# Patient Record
Sex: Male | Born: 1950 | Race: Black or African American | Hispanic: No | Marital: Single | Smoking: Former smoker
Health system: Southern US, Community
[De-identification: ages and names within clinical notes are randomized; demographics above are authoritative.]

## PROBLEM LIST (undated history)

## (undated) DIAGNOSIS — R011 Cardiac murmur, unspecified: Secondary | ICD-10-CM

## (undated) DIAGNOSIS — G9389 Other specified disorders of brain: Secondary | ICD-10-CM

## (undated) DIAGNOSIS — I739 Peripheral vascular disease, unspecified: Secondary | ICD-10-CM

## (undated) DIAGNOSIS — Q210 Ventricular septal defect: Secondary | ICD-10-CM

## (undated) DIAGNOSIS — I5022 Chronic systolic (congestive) heart failure: Secondary | ICD-10-CM

## (undated) DIAGNOSIS — I10 Essential (primary) hypertension: Secondary | ICD-10-CM

## (undated) DIAGNOSIS — R569 Unspecified convulsions: Secondary | ICD-10-CM

## (undated) DIAGNOSIS — E875 Hyperkalemia: Secondary | ICD-10-CM

## (undated) DIAGNOSIS — I2510 Atherosclerotic heart disease of native coronary artery without angina pectoris: Secondary | ICD-10-CM

## (undated) DIAGNOSIS — N289 Disorder of kidney and ureter, unspecified: Secondary | ICD-10-CM

## (undated) DIAGNOSIS — I209 Angina pectoris, unspecified: Secondary | ICD-10-CM

## (undated) DIAGNOSIS — K219 Gastro-esophageal reflux disease without esophagitis: Secondary | ICD-10-CM

## (undated) HISTORY — PX: OTHER SURGICAL HISTORY: SHX169

## (undated) HISTORY — PX: NECK SURGERY: SHX720

---

## 2012-07-04 ENCOUNTER — Emergency Department (HOSPITAL_COMMUNITY)
Admission: EM | Admit: 2012-07-04 | Discharge: 2012-07-04 | Disposition: A | Discharge status: Home / Self Care | Payer: Medicare Other | Attending: Emergency Medicine | Admitting: Emergency Medicine

## 2012-07-04 ENCOUNTER — Encounter (HOSPITAL_COMMUNITY): Payer: Self-pay

## 2012-07-04 ENCOUNTER — Other Ambulatory Visit: Payer: Self-pay | Admitting: *Deleted

## 2012-07-04 DIAGNOSIS — R21 Rash and other nonspecific skin eruption: Secondary | ICD-10-CM | POA: Insufficient documentation

## 2012-07-04 DIAGNOSIS — I739 Peripheral vascular disease, unspecified: Secondary | ICD-10-CM | POA: Insufficient documentation

## 2012-07-04 DIAGNOSIS — Z87891 Personal history of nicotine dependence: Secondary | ICD-10-CM | POA: Insufficient documentation

## 2012-07-04 DIAGNOSIS — Z9889 Other specified postprocedural states: Secondary | ICD-10-CM | POA: Insufficient documentation

## 2012-07-04 DIAGNOSIS — M549 Dorsalgia, unspecified: Secondary | ICD-10-CM | POA: Insufficient documentation

## 2012-07-04 DIAGNOSIS — R209 Unspecified disturbances of skin sensation: Secondary | ICD-10-CM | POA: Insufficient documentation

## 2012-07-04 DIAGNOSIS — M79609 Pain in unspecified limb: Secondary | ICD-10-CM

## 2012-07-04 DIAGNOSIS — Z79899 Other long term (current) drug therapy: Secondary | ICD-10-CM | POA: Insufficient documentation

## 2012-07-04 DIAGNOSIS — I2510 Atherosclerotic heart disease of native coronary artery without angina pectoris: Secondary | ICD-10-CM | POA: Insufficient documentation

## 2012-07-04 DIAGNOSIS — I10 Essential (primary) hypertension: Secondary | ICD-10-CM | POA: Insufficient documentation

## 2012-07-04 HISTORY — DX: Atherosclerotic heart disease of native coronary artery without angina pectoris: I25.10

## 2012-07-04 HISTORY — DX: Essential (primary) hypertension: I10

## 2012-07-04 LAB — URINALYSIS, ROUTINE W REFLEX MICROSCOPIC
Glucose, UA: NEGATIVE mg/dL
Hgb urine dipstick: NEGATIVE
Leukocytes, UA: NEGATIVE
Specific Gravity, Urine: 1.017 (ref 1.005–1.030)
Urobilinogen, UA: 1 mg/dL (ref 0.0–1.0)

## 2012-07-04 NOTE — Progress Notes (Addendum)
*  PRELIMINARY RESULTS* Vascular Ultrasound Right lower extremity venous duplex completed. Right lower extremity is negative for deep vein thrombosis. No evidence of right Baker's cyst.  Right Lower Extremity Arterial Duplex has been completed.   There is evidence of diminished waveforms in the distal right lower extremity with absence of flow in the right posterior tibial artery.   VASCULAR LAB PRELIMINARY  ARTERIAL  ABI completed:    RIGHT    LEFT    PRESSURE WAVEFORM  PRESSURE WAVEFORM  BRACHIAL 175 Triphasic BRACHIAL 183 Triphasic  DP 93 Monophasic DP 103 Triphasic  AT   AT    PT  Unable to insonate PT 143 Monophasic  PER   PER    GREAT TOE  NA GREAT TOE  NA    RIGHT LEFT  ABI 0.51 0.78   The right ABI is suggestive of moderate, borderline severe, arterial insufficiency. Unable to insonate the right posterior tibial artery. The left ABI is suggestive of mild arterial insufficiency.  Preliminary results discussed with Dr.Rancour.  07/04/2012 3:13 PM Gertie Fey, RDMS, RDCS

## 2012-07-04 NOTE — ED Notes (Signed)
Rt. Leg , lt. 3rd finger and pain for months.  Also having lower back pain and rt. Lower flank pain . Having urgency when voiding,  Denies any dsyuria   Pt. Denies any n/v.

## 2012-07-04 NOTE — ED Provider Notes (Signed)
I saw and evaluated the patient, reviewed the resident's note and I agree with the findings and plan.  RLE claudication with recent SFA stent placed at [facility] 2 weeks ago. No rest pain. Unable to palpate or doppler PT or DP pulses. Femorals intact. 5/5 strength in bilateral lower extremities. Ankle plantar and dorsiflexion intact. Great toe extension intact bilaterally.  +2 patellar reflexes bilaterally. antalgic gait. D/w Dr. Hart Rochester who has seen patient. 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 Plan to perform angiogram tomorrow.   Glynn Octave, MD 07/04/12 1736

## 2012-07-04 NOTE — ED Notes (Signed)
Pt c/o right calf and leg pain along with right sided flank pain and lower back pain. Pt reports it has been ongoing X 2 weeks. Pt sts his right foot feels like pins and needles. Pt sts he got dizzy when he was on the way here because he was walking fast. Pt in nad, skin warm and dry, resp e/u.

## 2012-07-04 NOTE — ED Provider Notes (Signed)
History     CSN: [id number]  Arrival date & time 07/04/12  1112   Chief Complaint  Patient presents with  . Leg Pain   HPI 62 y/o F very poor historian with PMHx significant for CAD and HTN presents to ED with right leg pain. He reports was discharged from [facility] 2 weeks ago were he was worked up for the same symptoms. Pt was told to have a blockage and 2 stents were inserted on his right leg. The pain is located on right calf with radiation to all his right foot. It is described as constant aching with intensity of 7-8/10. Reports pain worsens with exertion (walking) and alleviates mildly with a medication he does not remember name but makes his feel high/sleepy.   The associated symptoms are numbness and cold sensation of right foot. Pt also reports mild back pain but denies weakness or tingling sensation. No urinary or fecal incontinence, no saddle anesthesia.   Records from [facility] are received and they showed pt was admitted with malignant HTN and gastroenteritis developing NSTEMI while hospitalized. 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 Hypertension   . Coronary artery disease     Past Surgical History  Procedure Laterality Date  . Neck surgery      No family history on file.  History  Substance Use Topics  . Smoking status: Former Games developer  . Smokeless tobacco: Not on file  . Alcohol Use: No    Review of Systems  Constitutional: Negative.   HENT: Negative.   Respiratory: Negative.   Cardiovascular: Negative for chest pain, palpitations and leg swelling.  Gastrointestinal: Negative.   Genitourinary: Negative.   Musculoskeletal: Positive for back pain.  Skin:       Mild pruriginous papular rash on left arm.   Neurological: Positive for numbness.  Negative for dizziness and weakness.  Psychiatric/Behavioral: Negative.     Allergies  Review of patient's allergies indicates no known allergies.  Home Medications  No current outpatient prescriptions on file.  BP 146/65  Pulse 74  Temp(Src) 0 F (-17.8 C)  Resp 18  SpO2 97%  Physical Exam  Constitutional: He is oriented to person, place, and time. No distress.  Neck: Normal range of motion. Neck supple.  Old scar on right side of neck.  Cardiovascular: Normal rate, regular rhythm and normal heart sounds.  Exam reveals no gallop.   No murmur heard. Pulmonary/Chest: Effort normal and breath sounds normal.  Abdominal: Soft. Bowel sounds are normal.  Genitourinary:  Left groin with band aid still placed on site of catheterization for stent placement.  Musculoskeletal: He exhibits tenderness. He exhibits no edema.  Tenderness on right calf. No discrepancy on measurements of both LE. No edema or erythema noticed. Difficult to palpate pedal and poplitial pulses bilaterally. Good femoral pulses. Normal capillary refill and temperature on both extremities.   Neurological: He is alert and oriented to person, place, and time. He has normal strength and normal reflexes. No cranial nerve deficit or sensory deficit.  Antalgic gait.   Skin:  4 pruriginous papular lesions of 0.5 on right antecubital area.   Psychiatric: He has a normal mood and affect. He is not agitated.    ED Course  Procedures (including critical care time)  Labs Reviewed  URINALYSIS, ROUTINE  W REFLEX MICROSCOPIC   No results found.  RIGHT  LEFT   ABI  0.51  0.78   The right ABI is suggestive of moderate, borderline severe, arterial insufficiency. Unable to insonate the right posterior tibial artery. The left ABI is suggestive of mild arterial insufficiency  1. Right leg pain   2. Claudication    MDM  Regular dopplers were unsuccessful in finding bilateral pedal pulses. Lower Extremity Venous and Arterial  Doppler showed right borderline severe arterial insufficiency. Vascular Surgery consulted and pt will be discharged today with follow up tomorrow 07/05/2012, 8:00 am at [facility] Short Stay for further evaluation/intervention.   Dayarmys Piloto de Criselda Peaches, MD 07/04/12 1525

## 2012-07-04 NOTE — Consult Note (Signed)
[facility]ASCULAR [facility]IN SPECIALISTS OF  CONSULT NOTE 07/04/2012 DOB: [id number] MRN : [id number]  WN:[id number] calf pain with activity.  Pain is better with rest Referring Physician:Dayarmys Piloto de Criselda Peaches, MD ED [facility]  History of Present Illness: This Paul Mullins was bring a friend to [facility] today and had sudden onset of right calf pain walking from the parking lot.  He has has right calf pain with walking 25-50 feet that subsides with rest for over a year.  He had right SFA stent placement at  [facility]  2 weeks ago.  He thinks his pain is getting worse and not better. He denies any rest pain or numbness in his right foot. He states the claudication symptoms have not improved since her recent intervention performed in [place] Past Medical History  Diagnosis Date  . Hypertension   . Coronary artery disease     Past Surgical History  Procedure Laterality Date  . Neck surgery       ROS: [x]  Positive  [ ]  Denies    General: [ ]  Weight loss, [ ]  Fever, [ ]  chills Neurologic: [ ]  Dizziness, [ ]  Blackouts, [ ]  Seizure [ ]  Stroke, [ ]  "Mini stroke", [ ]  Slurred speech, [ ]  Temporary blindness; [ ]  weakness in arms or legs, [ ]  Hoarseness Cardiac: [ ]  Chest pain/pressure, [ ]  Shortness of breath at rest [x ] Shortness of breath with exertion, [ ]  Atrial fibrillation or irregular heartbeat Vascular: x[ ]  Pain in legs with walking, [ ]  Pain in legs at rest, [ ]  Pain in legs at night,  [ ]  Non-healing ulcer, [ ]  Blood clot in vein/DVT,   Pulmonary: [ ]  Home oxygen, [ ]  Productive cough, [ ]  Coughing up blood, [ ]  Asthma,  [ ]  Wheezing Musculoskeletal:  [ ]  Arthritis, [ ]  Low back pain, [ ]  Joint pain Hematologic: [ ]  Easy Bruising, [ ]  Anemia; [ ]  Hepatitis Gastrointestinal: [ ]  Blood in stool, [ ]  Gastroesophageal Reflux/heartburn, [ ]  Trouble swallowing Urinary: [ ]  chronic Kidney disease, [ ]  on HD - [ ]  MWF or [ ]  TTHS, [ ]  Burning with urination, [ ]   Difficulty urinating Skin: [x ] Rashes both upper arms, [ ]  Wounds Psychological: [ ]  Anxiety, [ ]  Depression  Social History History  Substance Use Topics  . Smoking status: Former Games developer  . Smokeless tobacco: Not on file  . Alcohol Use: No    Family History Brother DM Father DM, HNT Mother DM   No Known Allergies  No current facility-administered medications for this encounter.   Current Outpatient Prescriptions  Medication Sig Dispense Refill  . amLODipine (NORVASC) 10 MG tablet Take 10 mg by mouth daily.      . carvedilol (COREG) 25 MG tablet Take 25 mg by mouth 2 (two) times daily with a meal.      . hydrALAZINE (APRESOLINE) 100 MG tablet Take 100 mg by mouth 3 (three) times daily.      . isosorbide mononitrate (IMDUR) 60 MG 24 hr tablet Take 60 mg by mouth daily.      . nitroGLYCERIN (NITROSTAT) 0.4 MG SL tablet Place 0.4 mg under the tongue every 5 (five) minutes as needed for chest pain.      . pravastatin (PRAVACHOL) 40 MG tablet Take 40 mg by mouth daily.         Imaging: No results found.  Significant Diagnostic Studies: CBC No results found for this  basename: WBC, HGB, HCT, MCV, PLT    BMET No results found for this basename: na, k, cl, co2, glucose, bun, creatinine, calcium, gfrnonaa, gfraa    COAG No results found for this basename: INR, PROTIME   No results found for this basename: PTT     Physical Examination BP Readings from Last 3 Encounters:  07/04/12 164/67   Temp Readings from Last 3 Encounters:   SpO2 Readings from Last 3 Encounters:  07/04/12 99%   Pulse Readings from Last 3 Encounters:  07/04/12 67    General:  WDWN in NAD HENT: WNL Eyes: Pupils equal Pulmonary: normal non-labored breathing Cardiac: RRR, without  Murmurs, rubs or gallops; No carotid bruits Abdomen: soft, NT Skin:  Rashes both upper arms, ulcers noted Vascular Exam/Pulses:Radila pulses palpable.  Femoral pulses palpable, non palpable popliteal pulses no  palpable DP/PT bilateral. Doppler PT/DP left, DP right Extremities without ischemic changes, no Gangrene , no cellulitis; no open wounds;  Musculoskeletal: no muscle wasting or atrophy  Neurologic: A&O X 3; Appropriate Affect ;  SENSATION: normal; MOTOR FUNCTION: Pt has good and equal strength in all extremities - 5/5 Speech is fluent/normal  Non-Invasive Vascular Imaging: ABI  Right 0.51  Left 0.78   ASSESSMENT/PLAN: Return tomorrow for angiogram per DR. Chen to see if he is a candidate for further intervention in the right Lower extremity.  Paul Mullins, Paul Mullins  Agree with above assessment Patient has chronic claudication right leg and had SFA stenosis treated with a stent in [place] on 05/25/2012 Patient was to have returned for further consideration of an attempt to open up a chronic occlusion of the popliteal artery which was unsuccessful on previous attempt. Patient states he would like to transfer his care to [facility]  Patient has audible flow in right AT  and an ABI of 0.51 consistent with right popliteal occlusion   Will get angiogram tomorrow per Dr. Imogene Burn to see if patient is candidate for bypass and right leg or another attempt to open right popliteal artery which is chronically occluded. Patient to return in a.m.

## 2012-07-05 ENCOUNTER — Telehealth: Payer: Self-pay | Admitting: Vascular Surgery

## 2012-07-05 ENCOUNTER — Ambulatory Visit (HOSPITAL_COMMUNITY)
Admission: RE | Admit: 2012-07-05 | Discharge: 2012-07-05 | Disposition: A | Discharge status: Home / Self Care | Payer: Medicare Other | Source: Ambulatory Visit | Attending: Vascular Surgery | Admitting: Vascular Surgery

## 2012-07-05 ENCOUNTER — Encounter (HOSPITAL_COMMUNITY)
Admission: RE | Disposition: A | Discharge status: Home / Self Care | Payer: Self-pay | Source: Ambulatory Visit | Attending: Vascular Surgery

## 2012-07-05 ENCOUNTER — Other Ambulatory Visit: Payer: Self-pay | Admitting: *Deleted

## 2012-07-05 ENCOUNTER — Other Ambulatory Visit: Payer: Self-pay

## 2012-07-05 DIAGNOSIS — I708 Atherosclerosis of other arteries: Secondary | ICD-10-CM | POA: Insufficient documentation

## 2012-07-05 DIAGNOSIS — I70219 Atherosclerosis of native arteries of extremities with intermittent claudication, unspecified extremity: Secondary | ICD-10-CM

## 2012-07-05 DIAGNOSIS — I10 Essential (primary) hypertension: Secondary | ICD-10-CM | POA: Insufficient documentation

## 2012-07-05 DIAGNOSIS — I2510 Atherosclerotic heart disease of native coronary artery without angina pectoris: Secondary | ICD-10-CM | POA: Insufficient documentation

## 2012-07-05 HISTORY — PX: ABDOMINAL AORTAGRAM: SHX5454

## 2012-07-05 LAB — POCT I-STAT, CHEM 8
BUN: 17 mg/dL (ref 6–23)
Calcium, Ion: 1.14 mmol/L (ref 1.13–1.30)
Chloride: 106 mEq/L (ref 96–112)
Creatinine, Ser: 1.4 mg/dL — ABNORMAL HIGH (ref 0.50–1.35)
Glucose, Bld: 106 mg/dL — ABNORMAL HIGH (ref 70–99)
HCT: 42 % (ref 39.0–52.0)
Hemoglobin: 14.3 g/dL (ref 13.0–17.0)
Potassium: 3.7 mEq/L (ref 3.5–5.1)
Sodium: 140 mEq/L (ref 135–145)
TCO2: 26 mmol/L (ref 0–100)

## 2012-07-05 SURGERY — ABDOMINAL AORTAGRAM
Anesthesia: LOCAL

## 2012-07-05 MED ORDER — SODIUM CHLORIDE 0.9 % IV SOLN
INTRAVENOUS | Status: DC
  Administered 2012-07-05: 09:00:00 via INTRAVENOUS

## 2012-07-05 MED ORDER — OXYCODONE-ACETAMINOPHEN 5-325 MG PO TABS: 1.0000 | ORAL_TABLET | ORAL | Status: DC | PRN

## 2012-07-05 MED ORDER — SODIUM CHLORIDE 0.9 % IV SOLN: 1.0000 mL/kg/h | INTRAVENOUS | Status: DC

## 2012-07-05 MED ORDER — ACETAMINOPHEN 325 MG PO TABS: 650.0000 mg | ORAL_TABLET | ORAL | Status: DC | PRN

## 2012-07-05 MED ORDER — LIDOCAINE HCL (PF) 1 % IJ SOLN
INTRAMUSCULAR | Status: AC
  Filled 2012-07-05: qty 30

## 2012-07-05 MED ORDER — HYDRALAZINE HCL 20 MG/ML IJ SOLN
INTRAMUSCULAR | Status: AC
  Filled 2012-07-05: qty 1

## 2012-07-05 MED ORDER — HEPARIN (PORCINE) IN NACL 2-0.9 UNIT/ML-% IJ SOLN
INTRAMUSCULAR | Status: AC
  Filled 2012-07-05: qty 500

## 2012-07-05 MED ORDER — HYDRALAZINE HCL 20 MG/ML IJ SOLN
10.0000 mg | INTRAMUSCULAR | Status: DC | PRN
  Administered 2012-07-05 (×2): 10 mg via INTRAVENOUS
  Filled 2012-07-05: qty 0.5

## 2012-07-05 MED ORDER — MORPHINE SULFATE 2 MG/ML IJ SOLN: 2.0000 mg | INTRAMUSCULAR | Status: DC | PRN

## 2012-07-05 MED ORDER — ONDANSETRON HCL 4 MG/2ML IJ SOLN: 4.0000 mg | Freq: Four times a day (QID) | INTRAMUSCULAR | Status: DC | PRN

## 2012-07-05 NOTE — Op Note (Signed)
OPERATIVE NOTE   PROCEDURE: 1.  Left common femoral artery cannulation under ultrasound guidance 2.  Aortogram 3.  Second order arterial selection 4.  Right leg runoff  PRE-OPERATIVE DIAGNOSIS: Short distance right leg claudication  POST-OPERATIVE DIAGNOSIS: same as above   SURGEON: Leonides Sake, MD  ANESTHESIA: conscious sedation  ESTIMATED BLOOD LOSS: 50 cc  CONTRAST: 62 cc  FINDING(S):  Aorta: patent  Superior mesenteric artery: patent Celiac artery: patent  Right Left  RA Patent Patent  CIA Patent Patent  EIA Patent with 50% stenosis just distal to internal takeoff Patent  IIA Patent but 90% stenosis at takeoff Patent but 50% stenosis at takeoff  CFA Patent Patent  SFA Patent but diseased, 50% proximal stenosis and 50-75% distally,occludes distally    PFA Patent but diseased with 50% stenosis and occluded branch   Pop Occluded   Trif Occluded   AT Reconstitutes from collaterals, only vessel contiguous with right foot   Pero Occluded   PT Occluded    SPECIMEN(S):  none  INDICATIONS:   Paul Mullins is a 62 y.o. male who presents with short distance right leg intermittent claudication.  The patient presents for: aortogram and right leg runoff.  I discussed with the patient the nature of angiographic procedures, especially the limited patencies of any endovascular intervention.  The patient is aware of that the risks of an angiographic procedure include but are not limited to: bleeding, infection, access site complications, renal failure, embolization, rupture of vessel, dissection, possible need for emergent surgical intervention, possible need for surgical procedures to treat the patient's pathology, and stroke and death.  The patient is aware of the risks and agrees to proceed.  DESCRIPTION: After full informed consent was obtained from the patient, the patient was brought back to the angiography suite.  The patient was placed supine upon the angiography table and  connected to monitoring equipment.  The patient was then given conscious sedation, the amounts of which are documented in the patient's chart.  The patient was prepped and drape in the standard fashion for an angiographic procedure.  At this point, attention was turned to the left groin.  Under ultrasound guidance, the left common femoral artery will be cannulated with a 18 gauge needle.  The [facility] wire was passed up into the aorta.  The needle was exchanged for a 5-Fr sheath, which was advanced over the wire into the common femoral artery.  The dilator was then removed.  The Omniflush catheter was then loaded over the wire up to the level of L1.  The catheter was connected to the power injector circuit.  After de-airring and de-clotting the circuit, a power injector aortogram was completed.  The [facility] wire was replaced in the catheter, and using the [facility] and Crossover catheter, the right common iliac artery was selected.  The wire was advanced into the external iliac artery.  The catheter would not advance so it was exchange for an end-hole catheter.  Over the wire, I advanced the catheter with some difficulty down into the right external iliac artery.  An automated right leg runoff was completed.  Based on the images, no endovascular intervention is likely to have long term patency.  The patient likely needs a femoral to anterior tibial bypass with profundaplasty.  At this point, I removed the catheter.  The sheath was aspirated.  No clots were present and the sheath was reloaded with heparinized saline.     COMPLICATIONS: none  CONDITION: stable   Leonides Sake,  MD [facility] [place] Office: [phone] Pager: [phone]  07/05/2012, 11:01 AM

## 2012-07-05 NOTE — Progress Notes (Signed)
Dr Imogene Burn called back orders to give 10mg  of IV hydralizine were given.  Pts blood pressure has been reassessed twice since administering the medicine and his BP is still elevated.  Dr Imogene Burn stated to Discharge pt as normal and instruct him to take his home BP meds.  Will continue to monitor closely

## 2012-07-05 NOTE — H&P (View-Only) (Signed)
VASCULAR [facility] VEIN SPECIALISTS OF [place] CONSULT NOTE 07/04/2012 DOB: 07/24/1950 MRN : [id number]  CC:Right calf pain with activity.  Pain is better with rest Referring Physician:Dayarmys Piloto de La Paz, MD ED [facility]  History of Present Illness: This Gentleman was bring a friend to [facility] today and had sudden onset of right calf pain walking from the parking lot.  He has has right calf pain with walking 25-50 feet that subsides with rest for over a year.  He had right SFA stent placement at  [facility]  2 weeks ago.  He thinks his pain is getting worse and not better. He denies any rest pain or numbness in his right foot. He states the claudication symptoms have not improved since her recent intervention performed in [place] Past Medical History  Diagnosis Date  . Hypertension   . Coronary artery disease     Past Surgical History  Procedure Laterality Date  . Neck surgery       ROS: [x] Positive  [ ] Denies    General: [ ] Weight loss, [ ] Fever, [ ] chills Neurologic: [ ] Dizziness, [ ] Blackouts, [ ] Seizure [ ] Stroke, [ ] "Mini stroke", [ ] Slurred speech, [ ] Temporary blindness; [ ] weakness in arms or legs, [ ] Hoarseness Cardiac: [ ] Chest pain/pressure, [ ] Shortness of breath at rest [x ] Shortness of breath with exertion, [ ] Atrial fibrillation or irregular heartbeat Vascular: x[ ] Pain in legs with walking, [ ] Pain in legs at rest, [ ] Pain in legs at night,  [ ] Non-healing ulcer, [ ] Blood clot in vein/DVT,   Pulmonary: [ ] Home oxygen, [ ] Productive cough, [ ] Coughing up blood, [ ] Asthma,  [ ] Wheezing Musculoskeletal:  [ ] Arthritis, [ ] Low back pain, [ ] Joint pain Hematologic: [ ] Easy Bruising, [ ] Anemia; [ ] Hepatitis Gastrointestinal: [ ] Blood in stool, [ ] Gastroesophageal Reflux/heartburn, [ ] Trouble swallowing Urinary: [ ] chronic Kidney disease, [ ] on HD - [ ] MWF or [ ] TTHS, [ ] Burning with urination, [ ]  Difficulty urinating Skin: [x ] Rashes both upper arms, [ ] Wounds Psychological: [ ] Anxiety, [ ] Depression  Social History History  Substance Use Topics  . Smoking status: Former Smoker  . Smokeless tobacco: Not on file  . Alcohol Use: No    Family History Brother DM Father DM, HNT Mother DM   No Known Allergies  No current facility-administered medications for this encounter.   Current Outpatient Prescriptions  Medication Sig Dispense Refill  . amLODipine (NORVASC) 10 MG tablet Take 10 mg by mouth daily.      . carvedilol (COREG) 25 MG tablet Take 25 mg by mouth 2 (two) times daily with a meal.      . hydrALAZINE (APRESOLINE) 100 MG tablet Take 100 mg by mouth 3 (three) times daily.      . isosorbide mononitrate (IMDUR) 60 MG 24 hr tablet Take 60 mg by mouth daily.      . nitroGLYCERIN (NITROSTAT) 0.4 MG SL tablet Place 0.4 mg under the tongue every 5 (five) minutes as needed for chest pain.      . pravastatin (PRAVACHOL) 40 MG tablet Take 40 mg by mouth daily.         Imaging: No results found.  Significant Diagnostic Studies: CBC No results found for this   basename: WBC, HGB, HCT, MCV, PLT    BMET No results found for this basename: na, k, cl, co2, glucose, bun, creatinine, calcium, gfrnonaa, gfraa    COAG No results found for this basename: INR, PROTIME   No results found for this basename: PTT     Physical Examination BP Readings from Last 3 Encounters:  07/04/12 164/67   Temp Readings from Last 3 Encounters:   SpO2 Readings from Last 3 Encounters:  07/04/12 99%   Pulse Readings from Last 3 Encounters:  07/04/12 67    General:  WDWN in NAD HENT: WNL Eyes: Pupils equal Pulmonary: normal non-labored breathing Cardiac: RRR, without  Murmurs, rubs or gallops; No carotid bruits Abdomen: soft, NT Skin:  Rashes both upper arms, ulcers noted Vascular Exam/Pulses:Radila pulses palpable.  Femoral pulses palpable, non palpable popliteal pulses no  palpable DP/PT bilateral. Doppler PT/DP left, DP right Extremities without ischemic changes, no Gangrene , no cellulitis; no open wounds;  Musculoskeletal: no muscle wasting or atrophy  Neurologic: A&O X 3; Appropriate Affect ;  SENSATION: normal; MOTOR FUNCTION: Pt has good and equal strength in all extremities - 5/5 Speech is fluent/normal  Non-Invasive Vascular Imaging: ABI  Right 0.51  Left 0.78   ASSESSMENT/PLAN: Return tomorrow for angiogram per DR. Chen to see if he is a candidate for further intervention in the right Lower extremity.  COLLINS, EMMA MAUREEN  Agree with above assessment Patient has chronic claudication right leg and had SFA stenosis treated with a stent in [place] on 05/25/2012 Patient was to have returned for further consideration of an attempt to open up a chronic occlusion of the popliteal artery which was unsuccessful on previous attempt. Patient states he would like to transfer his care to [place]  Patient has audible flow in right AT  and an ABI of 0.51 consistent with right popliteal occlusion   Will get angiogram tomorrow per Dr. Chen to see if patient is candidate for bypass and right leg or another attempt to open right popliteal artery which is chronically occluded. Patient to return in a.m.  

## 2012-07-05 NOTE — Telephone Encounter (Addendum)
Message copied by Fredrich Birks on Thu Jul 05, 2012 11:51 AM ------      Message from: Melene Plan      Created: Thu Jul 05, 2012 11:20 AM                   ----- Message -----         From: Fransisco Hertz, MD         Sent: 07/05/2012  11:10 AM           To: Reuel Derby, Melene Plan, RN            Firmin Belisle      [phone]      [birth year]            PROCEDURE:      1.  Left common femoral artery cannulation under ultrasound guidance      2.  Aortogram      3.  Second order arterial selection      4.  Right leg runoff            Follow-up: Dr. Hart Rochester 2-4 weeks            Orders(s) for follow-up: BLE vein mapping       ------  07/05/12- mailed letter, unable to reach patient, dpm

## 2012-07-05 NOTE — Interval H&P Note (Signed)
[facility] [place]  History and Physical Update  The patient was interviewed and re-examined.  The patient's previous History and Physical has been reviewed and is unchanged from Dr. Hart Rochester consult on: 07/04/12.  There is no change in the plan of care: Aortogram, right leg runoff, and possible intervention.  Leonides Sake, MD [facility] [place] Office: [phone] Pager: [phone]  07/05/2012, 7:34 AM

## 2012-07-05 NOTE — Progress Notes (Signed)
pts BP is slowly increasing.  I have paged Dr Imogene Burn and awaiting further instructions

## 2012-07-30 ENCOUNTER — Encounter: Payer: Self-pay | Admitting: Vascular Surgery

## 2012-07-31 ENCOUNTER — Ambulatory Visit: Payer: Medicare Other | Admitting: Vascular Surgery

## 2012-08-06 ENCOUNTER — Encounter: Payer: Self-pay | Admitting: Vascular Surgery

## 2012-08-07 ENCOUNTER — Other Ambulatory Visit: Payer: Self-pay

## 2012-08-07 ENCOUNTER — Encounter (INDEPENDENT_AMBULATORY_CARE_PROVIDER_SITE_OTHER): Payer: Medicare Other | Admitting: *Deleted

## 2012-08-07 ENCOUNTER — Encounter: Payer: Self-pay | Admitting: Vascular Surgery

## 2012-08-07 ENCOUNTER — Ambulatory Visit (INDEPENDENT_AMBULATORY_CARE_PROVIDER_SITE_OTHER): Payer: Medicare Other | Admitting: Vascular Surgery

## 2012-08-07 VITALS — BP 161/85 | HR 81 | Resp 20 | Ht 64.0 in | Wt 185.0 lb

## 2012-08-07 DIAGNOSIS — I739 Peripheral vascular disease, unspecified: Secondary | ICD-10-CM

## 2012-08-07 NOTE — Progress Notes (Addendum)
[facility] [place]  Referred by:  Eliot Ford, MD [address] [place],  [address]  Reason for referral: ER [place]  History of Present Illness  Paul Mullins is a 62 y.o. (1950-07-20) male who presents with chief complaint: right leg pain.  Onset of symptom occurred 7 years ago.  Pain is described as walking greater than 50 feet and now pain at rest, severity 7/10.  Patient has attempted to treat this pain with rest.  Atherosclerotic risk factors include: High blood pressure, patient denies DM and hypercholesterolemia.  Past Medical History  Diagnosis Date  . Hypertension   . Coronary artery disease     Past Surgical History  Procedure Laterality Date  . Neck surgery    . Neck surgery      History   Social History  . Marital Status: Single    Spouse Name: N/A    Number of Children: N/A  . Years of Education: N/A   Occupational History  . Not on file.   Social History Main Topics  . Smoking status: Former Smoker    Types: Cigarettes    Quit date: 06/09/2012  . Smokeless tobacco: Never Used  . Alcohol Use: No  . Drug Use: No  . Sexually Active: Not on file   Other Topics Concern  . Not on file   Social History Narrative  . No narrative on file    History reviewed. No pertinent family history.  Current Outpatient Prescriptions on File Prior to Visit  Medication Sig Dispense Refill  . amLODipine (NORVASC) 10 MG tablet Take 10 mg by mouth daily.      . carvedilol (COREG) 25 MG tablet Take 25 mg by mouth 2 (two) times daily with a meal.      . hydrALAZINE (APRESOLINE) 100 MG tablet Take 100 mg by mouth 3 (three) times daily.      . isosorbide mononitrate (IMDUR) 60 MG 24 hr tablet Take 60 mg by mouth daily.      . nitroGLYCERIN (NITROSTAT) 0.4 MG SL tablet Place 0.4 mg under the tongue every 5 (five) minutes as needed for chest pain.      . pravastatin (PRAVACHOL) 40 MG tablet Take 40 mg by mouth daily.       No  current facility-administered medications on file prior to visit.    No Known Allergies   REVIEW OF SYSTEMS:  (Positives checked otherwise negative)  CARDIOVASCULAR:  [ ]  chest pain, [ ]  chest pressure, [ ]  palpitations, [ ]  shortness of breath when laying flat, [ ]  shortness of breath with exertion,   [ ]  pain in feet when walking, [ ]  pain in feet when laying flat, [ ]  history of blood clot in veins (DVT), [ ]  history of phlebitis, [x ] swelling in legs, [ ]  varicose veins  PULMONARY:  [ ]  productive cough, [ ]  asthma, [ ]  wheezing  NEUROLOGIC:  [ ]  weakness in arms or legs, [ ]  numbness in arms or legs, [ ]  difficulty speaking or slurred speech, [ ]  temporary loss of vision in one eye, [ ]  dizziness  HEMATOLOGIC:  [ ]  bleeding problems, [ ]  problems with blood clotting too easily  MUSCULOSKEL:  [ ]  joint pain, [ ]  joint swelling  GASTROINTEST:  [ ]   Vomiting blood, [ ]   Blood in stool     GENITOURINARY:  [ ]   Burning with urination, [ ]   Blood in urine  PSYCHIATRIC:  [ ]   history of major depression  INTEGUMENTARY:  [ ]  rashes, [ ]  ulcers  CONSTITUTIONAL:  [ ]  fever, [ ]  chills  Physical Examination  Filed Vitals:   08/07/12 1337  BP: 161/85  Pulse: 81  Resp: 20  Height: 5\' 4"  (1.626 m)  Weight: 185 lb (83.915 kg)   Body mass index is 31.74 kg/(m^2).  General: A&O x 3, WDWN Head: [place]/AT  Ear/Nose/Throat: Hearing grossly intact, nares w/o erythema or drainage, oropharynx w/o Erythema/Exudate  Eyes: PERRLA, EOMI Neck: Supple, no nuchal rigidity  Pulmonary: Sym exp, good air movt, CTAB, no rales, rhonchi, & positive wheezing right upper lung  Cardiac: RRR, Nl S1, S2, aortic  Murmurs Vascular: Vessel Right Left  Radial Palpable Palpable  Ulnar Palpable Palpable  Brachial Palpable Palpable  Carotid Palpable, without bruit Palpable, without bruit  Aorta Not palpable N/A  Femoral Palpable Palpable  Popliteal Not palpable Not palpable  PT not Palpable not Palpable   DP not Palpable not Palpable   Gastrointestinal: soft, NTND, Well healed right UQ scar-gallbladder  Musculoskeletal: M/S 5/5 throughout Extremities without ischemic changes  Neurologic: CN 2-12 intact, Pain and light touch intact in extremities, Motor exam as listed above  Psychiatric: Judgment intact, Mood & affect appropriatefor pt's clinical situation Dermatologic: See M/S exam for extremity exam, no rashes otherwise noted  Lymph : No Cervical, Axillary, or Inguinal lymphadenopathy   Non-Invasive Vascular Imaging  ABI (Date: 07/04/2012)  RLE: 0.51  LLE: 0.78  Summary:  - There is evidence of diminished arterial flow in the right popliteal artery, with no evidence of flow through the right posterior tibial artery.  The right ABI is suggestive of moderate, borderline severe, arterial insufficiency. The left ABI is suggestive of mild arterial insufficiency. - No evidence of deep vein or superficial thrombosis involving the right lower extremity and left common femoral vein. - No evidence of Baker's cyst on the right.  There is evidence of a 2.8cm structure of the left groin, possibly an enlarged lymph node. Prepared and Electronically Authenticated by   Vein mapping Left great saphenous .69-.28 calf.                               Outside Studies/Documentation  Abdominal Aortogram  FINDING(S):  Aorta: patent  Superior mesenteric artery: patent Celiac artery: patent  Right  Left   RA  Patent  Patent   CIA  Patent  Patent   EIA  Patent with 50% stenosis just distal to internal takeoff  Patent   IIA  Patent but 90% stenosis at takeoff  Patent but 50% stenosis at takeoff   CFA  Patent  Patent   SFA  Patent but diseased, 50% proximal stenosis and 50-75% distally,occludes distally    PFA  Patent but diseased with 50% stenosis and occluded branch    Pop  Occluded    Trif  Occluded    AT  Reconstitutes from collaterals, only vessel contiguous with right foot     Pero  Occluded    PT  Occluded       Medical Decision Making  Paul Mullins is a 62 y.o. male who presents with: sever chronic right leg intermittent claudication as well as rest pain. SFA stenosis treated with a stent in [place] on 05/25/2012  50% stenosis SFA Ocluded Popliteal Reconstitutes Anterior tibialis  He will be [facility] to [facility] 08-09-2012 For right fem-pop by pass surgery with vein.  Vein mapping was performed in the office today.      Thomasena Edis, Trayton Szabo [facility] PA-C [facility] [place] Office: [phone]   08/07/2012, 1:49 PM  Agree with above assessment Patient has severe vascular occlusive disease right leg with severe claudication at 20-30 feet and occasional rest ischemia-no history of nonhealing ulcers Severe popliteal and tibial occlusive disease with one-vessel runoff through anterior tibial artery Below knee popliteal artery is open and really best plan would be femoral or superficial femoral to popliteal bypass using saphenous vein if feasible Vein in both legs is borderline but not particularly better in one leg or the other We'll plan to use saphenous vein in right leg if adequate if not we'll use PTFE Discussed this with patient and he understands the risk of graft failure thrombosis and subsequent amputation because of his severe occlusive disease

## 2012-08-08 ENCOUNTER — Telehealth: Payer: Self-pay

## 2012-08-08 ENCOUNTER — Encounter (HOSPITAL_COMMUNITY)
Admission: RE | Admit: 2012-08-08 | Discharge: 2012-08-08 | Disposition: A | Discharge status: Home / Self Care | Payer: Medicare Other | Source: Ambulatory Visit | Attending: Vascular Surgery | Admitting: Vascular Surgery

## 2012-08-08 ENCOUNTER — Encounter (HOSPITAL_COMMUNITY): Payer: Self-pay

## 2012-08-08 ENCOUNTER — Encounter (HOSPITAL_COMMUNITY)
Admission: RE | Admit: 2012-08-08 | Discharge: 2012-08-08 | Disposition: A | Discharge status: Home / Self Care | Payer: Medicare Other | Source: Ambulatory Visit | Attending: Anesthesiology | Admitting: Anesthesiology

## 2012-08-08 HISTORY — DX: Peripheral vascular disease, unspecified: I73.9

## 2012-08-08 HISTORY — DX: Ventricular septal defect: Q21.0

## 2012-08-08 HISTORY — DX: Cardiac murmur, unspecified: R01.1

## 2012-08-08 HISTORY — DX: Angina pectoris, unspecified: I20.9

## 2012-08-08 HISTORY — DX: Chronic systolic (congestive) heart failure: I50.22

## 2012-08-08 LAB — COMPREHENSIVE METABOLIC PANEL
BUN: 18 mg/dL (ref 6–23)
CO2: 22 mEq/L (ref 19–32)
Chloride: 103 mEq/L (ref 96–112)
Creatinine, Ser: 1.32 mg/dL (ref 0.50–1.35)
GFR calc non Af Amer: 56 mL/min — ABNORMAL LOW (ref >90)
Total Bilirubin: 0.3 mg/dL (ref 0.3–1.2)

## 2012-08-08 LAB — URINALYSIS, ROUTINE W REFLEX MICROSCOPIC
Bilirubin Urine: NEGATIVE
Glucose, UA: NEGATIVE mg/dL
Ketones, ur: NEGATIVE mg/dL
Leukocytes, UA: NEGATIVE
Protein, ur: NEGATIVE mg/dL

## 2012-08-08 LAB — CBC
HCT: 39.6 % (ref 39.0–52.0)
MCV: 89.8 fL (ref 78.0–100.0)
RBC: 4.41 MIL/uL (ref 4.22–5.81)
WBC: 4.3 10*3/uL (ref 4.0–10.5)

## 2012-08-08 LAB — SURGICAL PCR SCREEN: MRSA, PCR: NEGATIVE

## 2012-08-08 LAB — PROTIME-INR
INR: 0.89 (ref 0.00–1.49)
Prothrombin Time: 12 seconds (ref 11.6–15.2)

## 2012-08-08 MED ORDER — DEXTROSE 5 % IV SOLN
1.5000 g | INTRAVENOUS | Status: DC
  Filled 2012-08-08: qty 1.5

## 2012-08-08 NOTE — Pre-Procedure Instructions (Signed)
Paul Mullins  08/08/2012   Your procedure is scheduled on: Thursday, April 24,2014  Report to [facility] at  5:30 AM.  Call this number if you have problems the morning of surgery: [phone]   Remember:   Do not eat food or drink liquids after midnight.   Take these medicines the morning of surgery with A SIP OF WATER:  amLODipine (NORVASC) 10 MG , carvedilol (COREG) 25 MG tablet, hydrALAZINE (APRESOLINE) 100 MG tablet, isosorbide mononitrate (IMDUR) 60 MG 24 hr tablet, If needed: nitroGLYCERIN (NITROSTAT) 0.4 MG SL tablet                          Do not wear jewelry, make-up or nail polish.  Do not wear lotions, powders, or perfumes. You may wear deodorant.  Do not shave 48 hours prior to surgery. Men may shave face and neck.  Do not bring valuables to the hospital.  Contacts, dentures or bridgework may not be worn into surgery.  Leave suitcase in the car. After surgery it may be brought to your room.  For patients admitted to the hospital, checkout time is 11:00 AM the day of discharge.   Patients discharged the day of surgery will not be allowed to drive home.  Name and phone number of your driver:   Special Instructions: Shower using CHG 2 nights before surgery and the night before surgery.  If you shower the day of surgery use CHG.  Use special wash - you have one bottle of CHG for all showers.  You should use approximately 1/3 of the bottle for each shower.   Please read over the following fact sheets that you were given: Pain Booklet, Coughing and Deep Breathing, Blood Transfusion Information and Surgical Site Infection Prevention

## 2012-08-08 NOTE — Telephone Encounter (Signed)
Message copied by Phillips Odor on Wed Aug 08, 2012  2:53 PM ------      Message from: Jerold Coombe      Created: Wed Aug 08, 2012  2:30 PM      Regarding: OR       Okey Regal,            Here's the info on Firas Guardado DOB May 11, 2050.            Last cardiologist visit was on 02/07/12 with Dr. Lora Havens at [facility]  From what I gathered, she seems him for CHF and cardiologist Dr. Iline Oven Cheek has followed him for PVD.  He is currently out of amlodipine, Plavix, Coreg, and Pravastatin.            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            He does not remember ever having a stress or cath--and [facility] and [facility] only sent an echo that had been done within the past three years.            I spoke with Marisue Humble who had Dr. Noreene Larsson speak with Dr. Hart Rochester.  I think he was considering canceling the case to allow for further work-up.            Hope this helps if you need further communication with cardiology.  (I attached a copy to Dr. Hart Rochester as well.)            Revonda Standard ------

## 2012-08-08 NOTE — Telephone Encounter (Signed)
Rec'd call from Dr. Hart Rochester with orders to cancel OR case tomorrow.  Stated Dr. Noreene Larsson from Anesthesia feels pt. Needs cardiac clearance.   Rec'd. Verbal order for Cardiolite Stress Test.  Dr. Hart Rochester requests the stress test be done early next week, if possible.

## 2012-08-08 NOTE — Progress Notes (Signed)
Anesthesia PAT Evaluation:  Patient is a 62 year old male scheduled for right FPBG on 08/09/12 @ 0730 by Dr. Hart Mullins.  This case was just posted yesterday afternoon, and he presents today for his PAT visit.    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  He is not very forth coming with smoking history, but admits to occasional smoking.  He was hospitalized at [facility] in later March 2013 for abdominal pain and ruled out for MI by enzymes.  CT showed diverticulitis.  Patient is not a great historian.  He denied any heart issues, but them reported that he is followed at [facility] in [place]a [facility].  I did receive some records from the [facility] location.  His last visit was with Dr. Lora Mullins in October 2013.  She ordered a follow-up echo which was done on 02/14/12 (see below).  He reports he is also followed by cardiologist Dr. Beverely Mullins, but office reports this has been more for PVD.  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  He does not remember ever having a stress or cardiac cath. I requested any recent stress/echo/cath as available from [facility] and [facility] and only an echo and EKG were sent.  EKG on 07/16/12 ([facility]) showed NSR, non-specific ST and T wave changes, increased voltage.  His previous EKG at [facility] on 07/05/12 showed NSR, ST elevation consider early repolarization, pericarditis, or injury.    CXR on 08/08/12 showed no evidence of acute cardiopulmonary disease.  Preoperative labs noted.  Patient evaluated during his PAT visit.  He presents with his girlfriend and  grandson.  He denies chest pain, SOB, or significant DOE although his activity is limited due to claudication.  He reports his weight has been stable overall (typically stays 185-190 lbs.).  He recently ran out of four of his medications: Plavix, Coreg, amlodipine, and pravastatin.  Exam shows heart RRR, III/VI holosystolic SEM with radiation to his left neck, lungs clear but diminished.  Mild (up to 1 + pretibial edema).  Due to cardiac history, I asked Paul Mullins to wait while I discussed his history and planned procedure with anesthesiologist Dr. Noreene Mullins.  Apparently, he or his family members were frustrated with the amount of time they had spent in PAT, so they left before Dr. Noreene Mullins could come see the patient.  Dr. Noreene Mullins and Dr. Hart Mullins ultimately spoke, and Dr. Hart Mullins decided to postpone patient's procedure until he could have further cardiology evaluation and/or nuclear stress testing. Carol at [facility] given patient's cardiologist's contact information.  [name] [facility]/Anesthesiology Phone [phone] 08/08/2012 3:12 PM

## 2012-08-08 NOTE — Progress Notes (Signed)
Pt denies SOB, chest pain and is seen in [facility] by Dr. Sharalyn Ink. Revonda Standard the PA, reviewed pt EKG and records that were requested from the [facility]. Revonda Standard to follow- up with patient.

## 2012-08-09 ENCOUNTER — Encounter (HOSPITAL_COMMUNITY): Admission: RE | Payer: Self-pay | Source: Ambulatory Visit

## 2012-08-09 ENCOUNTER — Inpatient Hospital Stay (HOSPITAL_COMMUNITY): Admission: RE | Admit: 2012-08-09 | Payer: Medicare Other | Source: Ambulatory Visit | Admitting: Vascular Surgery

## 2012-08-09 SURGERY — BYPASS GRAFT FEMORAL-POPLITEAL ARTERY
Anesthesia: General | Site: Leg Upper | Laterality: Right

## 2012-08-15 ENCOUNTER — Encounter (HOSPITAL_COMMUNITY): Payer: Self-pay | Admitting: Pharmacy Technician

## 2012-08-15 ENCOUNTER — Other Ambulatory Visit: Payer: Self-pay

## 2012-08-15 NOTE — Progress Notes (Signed)
Anesthesia followup: Please refer to my note from 08/08/2012. Since then patient has been reevaluated at [facility] by Dr. Cathlean Cower.  Patient had a nuclear stress test on 08/14/12 ([facility]) that showed no evidence of ischemia.  Small fixed inferoapical defect consistent with soft tissue attenuation or previous scar.  There is a mild decrease in LVEF with EF 40%, though visually looks better than 40%.  Ultimately, Dr. Dot Been felt that patient's overall risk for the planned procedure is low to moderate.  Right FPBG has been rescheduled for 08/17/12.  Additional preoperative orders, if any, per Dr. Hart Rochester.  [name] [facility]/Anesthesiology Phone [phone] 08/15/2012 10:58 AM

## 2012-08-16 MED ORDER — DEXTROSE 5 % IV SOLN
1.5000 g | INTRAVENOUS | Status: DC
  Filled 2012-08-16: qty 1.5

## 2012-08-16 NOTE — Progress Notes (Signed)
Pt notified of arrival time of 0530

## 2012-08-17 ENCOUNTER — Inpatient Hospital Stay (HOSPITAL_COMMUNITY): Payer: Medicare Other

## 2012-08-17 ENCOUNTER — Inpatient Hospital Stay (HOSPITAL_COMMUNITY)
Admission: RE | Admit: 2012-08-17 | Discharge: 2012-08-21 | DRG: 253 | Disposition: A | Discharge status: Home / Self Care | Payer: Medicare Other | Source: Ambulatory Visit | Attending: Vascular Surgery | Admitting: Vascular Surgery

## 2012-08-17 ENCOUNTER — Inpatient Hospital Stay (HOSPITAL_COMMUNITY): Payer: Medicare Other | Admitting: Vascular Surgery

## 2012-08-17 ENCOUNTER — Encounter (HOSPITAL_COMMUNITY): Payer: Self-pay | Admitting: Vascular Surgery

## 2012-08-17 ENCOUNTER — Telehealth: Payer: Self-pay | Admitting: Vascular Surgery

## 2012-08-17 ENCOUNTER — Encounter (HOSPITAL_COMMUNITY): Payer: Self-pay

## 2012-08-17 ENCOUNTER — Encounter (HOSPITAL_COMMUNITY)
Admission: RE | Disposition: A | Discharge status: Home / Self Care | Payer: Self-pay | Source: Ambulatory Visit | Attending: Vascular Surgery

## 2012-08-17 DIAGNOSIS — Z79899 Other long term (current) drug therapy: Secondary | ICD-10-CM

## 2012-08-17 DIAGNOSIS — I5022 Chronic systolic (congestive) heart failure: Secondary | ICD-10-CM | POA: Diagnosis present

## 2012-08-17 DIAGNOSIS — Z01818 Encounter for other preprocedural examination: Secondary | ICD-10-CM

## 2012-08-17 DIAGNOSIS — Z87891 Personal history of nicotine dependence: Secondary | ICD-10-CM

## 2012-08-17 DIAGNOSIS — I2510 Atherosclerotic heart disease of native coronary artery without angina pectoris: Secondary | ICD-10-CM | POA: Diagnosis present

## 2012-08-17 DIAGNOSIS — I509 Heart failure, unspecified: Secondary | ICD-10-CM | POA: Diagnosis present

## 2012-08-17 DIAGNOSIS — I739 Peripheral vascular disease, unspecified: Secondary | ICD-10-CM

## 2012-08-17 DIAGNOSIS — I70219 Atherosclerosis of native arteries of extremities with intermittent claudication, unspecified extremity: Secondary | ICD-10-CM

## 2012-08-17 DIAGNOSIS — Q210 Ventricular septal defect: Secondary | ICD-10-CM

## 2012-08-17 DIAGNOSIS — Z01812 Encounter for preprocedural laboratory examination: Secondary | ICD-10-CM

## 2012-08-17 DIAGNOSIS — I10 Essential (primary) hypertension: Secondary | ICD-10-CM | POA: Diagnosis present

## 2012-08-17 DIAGNOSIS — Z7902 Long term (current) use of antithrombotics/antiplatelets: Secondary | ICD-10-CM

## 2012-08-17 DIAGNOSIS — I70229 Atherosclerosis of native arteries of extremities with rest pain, unspecified extremity: Principal | ICD-10-CM | POA: Diagnosis present

## 2012-08-17 HISTORY — PX: FEMORAL-POPLITEAL BYPASS GRAFT: SHX937

## 2012-08-17 LAB — CBC
MCH: 29.8 pg (ref 26.0–34.0)
MCHC: 33.3 g/dL (ref 30.0–36.0)
MCV: 89.6 fL (ref 78.0–100.0)
Platelets: 187 10*3/uL (ref 150–400)
RDW: 14.6 % (ref 11.5–15.5)

## 2012-08-17 SURGERY — BYPASS GRAFT FEMORAL-POPLITEAL ARTERY
Anesthesia: General | Site: Leg Upper | Laterality: Right | Wound class: Clean

## 2012-08-17 MED ORDER — LABETALOL HCL 5 MG/ML IV SOLN
10.0000 mg | INTRAVENOUS | Status: DC | PRN
  Administered 2012-08-17: 10 mg via INTRAVENOUS
  Filled 2012-08-17: qty 4

## 2012-08-17 MED ORDER — 0.9 % SODIUM CHLORIDE (POUR BTL) OPTIME
TOPICAL | Status: DC | PRN
  Administered 2012-08-17: 1000 mL

## 2012-08-17 MED ORDER — CLONIDINE HCL 0.1 MG PO TABS
0.1000 mg | ORAL_TABLET | ORAL | Status: AC
  Filled 2012-08-17: qty 1

## 2012-08-17 MED ORDER — ISOSORBIDE MONONITRATE ER 60 MG PO TB24: 60.0000 mg | ORAL_TABLET | Freq: Every day | ORAL | Status: DC

## 2012-08-17 MED ORDER — ISOSORBIDE MONONITRATE ER 60 MG PO TB24
90.0000 mg | ORAL_TABLET | Freq: Every day | ORAL | Status: DC
  Administered 2012-08-18 – 2012-08-21 (×4): 90 mg via ORAL
  Filled 2012-08-17 (×5): qty 1

## 2012-08-17 MED ORDER — NITROGLYCERIN 0.4 MG SL SUBL: 0.4000 mg | SUBLINGUAL_TABLET | SUBLINGUAL | Status: DC | PRN

## 2012-08-17 MED ORDER — TEMAZEPAM 15 MG PO CAPS: 15.0000 mg | ORAL_CAPSULE | Freq: Every evening | ORAL | Status: DC | PRN

## 2012-08-17 MED ORDER — GUAIFENESIN-DM 100-10 MG/5ML PO SYRP: 15.0000 mL | ORAL_SOLUTION | ORAL | Status: DC | PRN

## 2012-08-17 MED ORDER — SODIUM CHLORIDE 0.9 % IV SOLN: 500.0000 mL | Freq: Once | INTRAVENOUS | Status: AC | PRN

## 2012-08-17 MED ORDER — METOPROLOL TARTRATE 1 MG/ML IV SOLN
2.0000 mg | INTRAVENOUS | Status: DC | PRN
  Administered 2012-08-17: 4 mg via INTRAVENOUS

## 2012-08-17 MED ORDER — POTASSIUM CHLORIDE CRYS ER 20 MEQ PO TBCR: 20.0000 meq | EXTENDED_RELEASE_TABLET | Freq: Once | ORAL | Status: AC | PRN

## 2012-08-17 MED ORDER — ONDANSETRON HCL 4 MG/2ML IJ SOLN: 4.0000 mg | Freq: Four times a day (QID) | INTRAMUSCULAR | Status: DC | PRN

## 2012-08-17 MED ORDER — AMLODIPINE BESYLATE 10 MG PO TABS
10.0000 mg | ORAL_TABLET | ORAL | Status: AC
  Administered 2012-08-17: 10 mg via ORAL
  Filled 2012-08-17: qty 1

## 2012-08-17 MED ORDER — ONDANSETRON HCL 4 MG/2ML IJ SOLN: 4.0000 mg | Freq: Once | INTRAMUSCULAR | Status: DC | PRN

## 2012-08-17 MED ORDER — MORPHINE SULFATE 2 MG/ML IJ SOLN
2.0000 mg | INTRAMUSCULAR | Status: DC | PRN
  Administered 2012-08-17: 4 mg via INTRAVENOUS
  Administered 2012-08-17: 2 mg via INTRAVENOUS
  Administered 2012-08-18 (×2): 4 mg via INTRAVENOUS
  Filled 2012-08-17: qty 2
  Filled 2012-08-17: qty 1
  Filled 2012-08-17: qty 2

## 2012-08-17 MED ORDER — HYDROMORPHONE HCL PF 1 MG/ML IJ SOLN
0.2500 mg | INTRAMUSCULAR | Status: DC | PRN
  Administered 2012-08-17: 0.25 mg via INTRAVENOUS
  Administered 2012-08-17: 0.5 mg via INTRAVENOUS
  Administered 2012-08-17: 0.25 mg via INTRAVENOUS

## 2012-08-17 MED ORDER — HYDRALAZINE HCL 20 MG/ML IJ SOLN
10.0000 mg | INTRAMUSCULAR | Status: DC | PRN
  Administered 2012-08-17: 10 mg via INTRAVENOUS

## 2012-08-17 MED ORDER — CARVEDILOL 25 MG PO TABS
25.0000 mg | ORAL_TABLET | Freq: Two times a day (BID) | ORAL | Status: AC
  Administered 2012-08-17: 25 mg via ORAL
  Filled 2012-08-17: qty 1

## 2012-08-17 MED ORDER — CLONIDINE HCL 0.1 MG PO TABS
0.1000 mg | ORAL_TABLET | Freq: Two times a day (BID) | ORAL | Status: DC
  Administered 2012-08-17 – 2012-08-21 (×8): 0.1 mg via ORAL
  Filled 2012-08-17 (×9): qty 1

## 2012-08-17 MED ORDER — GLYCOPYRROLATE 0.2 MG/ML IJ SOLN
INTRAMUSCULAR | Status: DC | PRN
  Administered 2012-08-17: 0.4 mg via INTRAVENOUS

## 2012-08-17 MED ORDER — NEOSTIGMINE METHYLSULFATE 1 MG/ML IJ SOLN
INTRAMUSCULAR | Status: DC | PRN
  Administered 2012-08-17: 3 mg via INTRAVENOUS

## 2012-08-17 MED ORDER — DOPAMINE-DEXTROSE 3.2-5 MG/ML-% IV SOLN: 3.0000 ug/kg/min | INTRAVENOUS | Status: DC

## 2012-08-17 MED ORDER — VECURONIUM BROMIDE 10 MG IV SOLR
INTRAVENOUS | Status: DC | PRN
  Administered 2012-08-17: 2 mg via INTRAVENOUS

## 2012-08-17 MED ORDER — HYDRALAZINE HCL 50 MG PO TABS
100.0000 mg | ORAL_TABLET | Freq: Three times a day (TID) | ORAL | Status: DC
  Administered 2012-08-17 – 2012-08-21 (×11): 100 mg via ORAL
  Filled 2012-08-17 (×14): qty 2

## 2012-08-17 MED ORDER — SIMVASTATIN 5 MG PO TABS
5.0000 mg | ORAL_TABLET | Freq: Every day | ORAL | Status: DC
  Administered 2012-08-18 – 2012-08-20 (×3): 5 mg via ORAL
  Filled 2012-08-17 (×5): qty 1

## 2012-08-17 MED ORDER — PROPOFOL 10 MG/ML IV BOLUS
INTRAVENOUS | Status: DC | PRN
  Administered 2012-08-17: 200 mg via INTRAVENOUS

## 2012-08-17 MED ORDER — ISOSORBIDE MONONITRATE ER 60 MG PO TB24
90.0000 mg | ORAL_TABLET | Freq: Once | ORAL | Status: AC
  Administered 2012-08-17: 90 mg via ORAL
  Filled 2012-08-17: qty 1

## 2012-08-17 MED ORDER — ACETAMINOPHEN 325 MG PO TABS
325.0000 mg | ORAL_TABLET | ORAL | Status: DC | PRN
  Administered 2012-08-18: 650 mg via ORAL
  Filled 2012-08-17: qty 2

## 2012-08-17 MED ORDER — PHENYLEPHRINE HCL 10 MG/ML IJ SOLN
INTRAMUSCULAR | Status: DC | PRN
  Administered 2012-08-17 (×5): 80 ug via INTRAVENOUS

## 2012-08-17 MED ORDER — PHENOL 1.4 % MT LIQD: 1.0000 | OROMUCOSAL | Status: DC | PRN

## 2012-08-17 MED ORDER — HEPARIN SODIUM (PORCINE) 1000 UNIT/ML IJ SOLN
INTRAMUSCULAR | Status: DC | PRN
Start: 2012-08-17 — End: 2012-08-17
  Administered 2012-08-17: 6000 [IU] via INTRAVENOUS

## 2012-08-17 MED ORDER — HYDROMORPHONE HCL PF 1 MG/ML IJ SOLN
INTRAMUSCULAR | Status: AC
  Filled 2012-08-17: qty 1

## 2012-08-17 MED ORDER — AMLODIPINE BESYLATE 10 MG PO TABS
10.0000 mg | ORAL_TABLET | Freq: Once | ORAL | Status: AC
  Administered 2012-08-17: 10 mg via ORAL
  Filled 2012-08-17: qty 1

## 2012-08-17 MED ORDER — DEXTROSE 5 % IV SOLN
1.5000 g | Freq: Two times a day (BID) | INTRAVENOUS | Status: AC
  Administered 2012-08-17 – 2012-08-18 (×2): 1.5 g via INTRAVENOUS
  Filled 2012-08-17 (×2): qty 1.5

## 2012-08-17 MED ORDER — OXYCODONE HCL 5 MG PO TABS
5.0000 mg | ORAL_TABLET | ORAL | Status: DC | PRN
  Administered 2012-08-17 – 2012-08-19 (×4): 10 mg via ORAL
  Administered 2012-08-20: 5 mg via ORAL
  Filled 2012-08-17 (×4): qty 2
  Filled 2012-08-17: qty 1

## 2012-08-17 MED ORDER — MORPHINE SULFATE 4 MG/ML IJ SOLN
INTRAMUSCULAR | Status: AC
  Filled 2012-08-17: qty 1

## 2012-08-17 MED ORDER — HEPARIN (PORCINE) IN NACL 100-0.45 UNIT/ML-% IJ SOLN
600.0000 [IU]/h | INTRAMUSCULAR | Status: DC
  Administered 2012-08-17: 600 [IU]/h via INTRAVENOUS
  Filled 2012-08-17 (×3): qty 250

## 2012-08-17 MED ORDER — CARVEDILOL 25 MG PO TABS
25.0000 mg | ORAL_TABLET | ORAL | Status: AC
  Filled 2012-08-17: qty 1

## 2012-08-17 MED ORDER — SODIUM CHLORIDE 0.9 % IV SOLN: INTRAVENOUS | Status: DC

## 2012-08-17 MED ORDER — METOPROLOL TARTRATE 1 MG/ML IV SOLN
INTRAVENOUS | Status: AC
  Filled 2012-08-17: qty 5

## 2012-08-17 MED ORDER — DEXTROSE 5 % IV SOLN
1.5000 g | INTRAVENOUS | Status: DC | PRN
  Administered 2012-08-17: 1.5 g via INTRAVENOUS

## 2012-08-17 MED ORDER — PHENYLEPHRINE HCL 10 MG/ML IJ SOLN
10.0000 mg | INTRAVENOUS | Status: DC | PRN
  Administered 2012-08-17: 30 ug/min via INTRAVENOUS

## 2012-08-17 MED ORDER — LIDOCAINE HCL (CARDIAC) 20 MG/ML IV SOLN
INTRAVENOUS | Status: DC | PRN
  Administered 2012-08-17: 60 mg via INTRAVENOUS

## 2012-08-17 MED ORDER — HYDRALAZINE HCL 20 MG/ML IJ SOLN
INTRAMUSCULAR | Status: AC
  Filled 2012-08-17: qty 1

## 2012-08-17 MED ORDER — LIDOCAINE HCL 4 % MT SOLN
OROMUCOSAL | Status: DC | PRN
  Administered 2012-08-17: 4 mL via TOPICAL

## 2012-08-17 MED ORDER — SODIUM CHLORIDE 0.9 % IV SOLN
INTRAVENOUS | Status: DC
  Administered 2012-08-17: 75 mL/h via INTRAVENOUS

## 2012-08-17 MED ORDER — ACETAMINOPHEN 650 MG RE SUPP: 325.0000 mg | RECTAL | Status: DC | PRN

## 2012-08-17 MED ORDER — DOCUSATE SODIUM 100 MG PO CAPS
100.0000 mg | ORAL_CAPSULE | Freq: Every day | ORAL | Status: DC
  Administered 2012-08-18 – 2012-08-21 (×4): 100 mg via ORAL
  Filled 2012-08-17 (×4): qty 1

## 2012-08-17 MED ORDER — PANTOPRAZOLE SODIUM 40 MG PO TBEC
40.0000 mg | DELAYED_RELEASE_TABLET | Freq: Every day | ORAL | Status: DC
  Administered 2012-08-18 – 2012-08-21 (×4): 40 mg via ORAL
  Filled 2012-08-17 (×4): qty 1

## 2012-08-17 MED ORDER — ROCURONIUM BROMIDE 100 MG/10ML IV SOLN
INTRAVENOUS | Status: DC | PRN
  Administered 2012-08-17: 50 mg via INTRAVENOUS

## 2012-08-17 MED ORDER — HEPARIN SODIUM (PORCINE) 5000 UNIT/ML IJ SOLN
INTRAMUSCULAR | Status: DC | PRN
  Administered 2012-08-17: 09:00:00

## 2012-08-17 MED ORDER — ONDANSETRON HCL 4 MG/2ML IJ SOLN
INTRAMUSCULAR | Status: DC | PRN
  Administered 2012-08-17: 4 mg via INTRAVENOUS

## 2012-08-17 MED ORDER — FENTANYL CITRATE 0.05 MG/ML IJ SOLN
INTRAMUSCULAR | Status: DC | PRN
  Administered 2012-08-17 (×3): 50 ug via INTRAVENOUS
  Administered 2012-08-17: 150 ug via INTRAVENOUS
  Administered 2012-08-17: 50 ug via INTRAVENOUS

## 2012-08-17 MED ORDER — CARVEDILOL 25 MG PO TABS
25.0000 mg | ORAL_TABLET | Freq: Two times a day (BID) | ORAL | Status: DC
  Administered 2012-08-17 – 2012-08-21 (×8): 25 mg via ORAL
  Filled 2012-08-17 (×11): qty 1

## 2012-08-17 MED ORDER — LACTATED RINGERS IV SOLN
INTRAVENOUS | Status: DC | PRN
  Administered 2012-08-17: 07:00:00 via INTRAVENOUS

## 2012-08-17 MED ORDER — OXYCODONE HCL 5 MG PO TABS: 5.0000 mg | ORAL_TABLET | Freq: Four times a day (QID) | ORAL | Status: DC | PRN

## 2012-08-17 MED ORDER — AMLODIPINE BESYLATE 10 MG PO TABS
10.0000 mg | ORAL_TABLET | Freq: Every day | ORAL | Status: DC
  Administered 2012-08-18 – 2012-08-21 (×4): 10 mg via ORAL
  Filled 2012-08-17 (×5): qty 1

## 2012-08-17 MED ORDER — ALUM & MAG HYDROXIDE-SIMETH 200-200-20 MG/5ML PO SUSP: 15.0000 mL | ORAL | Status: DC | PRN

## 2012-08-17 MED ORDER — IOHEXOL 300 MG/ML  SOLN
INTRAMUSCULAR | Status: DC | PRN
  Administered 2012-08-17: 60 mL via INTRAVENOUS

## 2012-08-17 SURGICAL SUPPLY — 67 items
BANDAGE ESMARK 6X9 LF (GAUZE/BANDAGES/DRESSINGS) IMPLANT
BNDG ESMARK 6X9 LF (GAUZE/BANDAGES/DRESSINGS)
BOOT SUTURE AID YELLOW STND (SUTURE) IMPLANT
CANISTER SUCTION 2500CC (MISCELLANEOUS) ×2 IMPLANT
CATH EMB 3FR 80CM (CATHETERS) ×2 IMPLANT
CLIP TI MEDIUM 24 (CLIP) ×2 IMPLANT
CLIP TI WIDE RED SMALL 24 (CLIP) ×4 IMPLANT
CLOTH BEACON ORANGE TIMEOUT ST (SAFETY) ×2 IMPLANT
COVER SURGICAL LIGHT HANDLE (MISCELLANEOUS) ×2 IMPLANT
DECANTER SPIKE VIAL GLASS SM (MISCELLANEOUS) IMPLANT
DERMABOND ADVANCED (GAUZE/BANDAGES/DRESSINGS) ×5
DERMABOND ADVANCED .7 DNX12 (GAUZE/BANDAGES/DRESSINGS) ×5 IMPLANT
DRAIN SNY 10X20 3/4 PERF (WOUND CARE) IMPLANT
DRAPE WARM FLUID 44X44 (DRAPE) ×2 IMPLANT
DRAPE X-RAY CASS 24X20 (DRAPES) IMPLANT
DRSG COVADERM 4X8 (GAUZE/BANDAGES/DRESSINGS) ×2 IMPLANT
ELECT CAUTERY BLADE 6.4 (BLADE) ×2 IMPLANT
ELECT REM PT RETURN 9FT ADLT (ELECTROSURGICAL) ×2
ELECTRODE REM PT RTRN 9FT ADLT (ELECTROSURGICAL) ×1 IMPLANT
EVACUATOR SILICONE 100CC (DRAIN) IMPLANT
GLOVE BIO SURGEON STRL SZ 6.5 (GLOVE) ×4 IMPLANT
GLOVE BIO SURGEON STRL SZ7 (GLOVE) ×2 IMPLANT
GLOVE BIOGEL PI IND STRL 6.5 (GLOVE) ×3 IMPLANT
GLOVE BIOGEL PI IND STRL 7.5 (GLOVE) ×1 IMPLANT
GLOVE BIOGEL PI INDICATOR 6.5 (GLOVE) ×3
GLOVE BIOGEL PI INDICATOR 7.5 (GLOVE) ×1
GLOVE SS BIOGEL STRL SZ 7 (GLOVE) ×1 IMPLANT
GLOVE SS N UNI LF 7.0 STRL (GLOVE) ×4 IMPLANT
GLOVE SS N UNI LF 7.5 STRL (GLOVE) ×4 IMPLANT
GLOVE SUPERSENSE BIOGEL SZ 7 (GLOVE) ×1
GLOVE SURG SS PI 6.5 STRL IVOR (GLOVE) ×2 IMPLANT
GOWN STRL NON-REIN LRG LVL3 (GOWN DISPOSABLE) ×4 IMPLANT
GOWN STRL REIN XL XLG (GOWN DISPOSABLE) ×6 IMPLANT
INSERT FOGARTY SM (MISCELLANEOUS) IMPLANT
KIT BASIN OR (CUSTOM PROCEDURE TRAY) ×2 IMPLANT
KIT ROOM TURNOVER OR (KITS) ×2 IMPLANT
NS IRRIG 1000ML POUR BTL (IV SOLUTION) ×4 IMPLANT
PACK PERIPHERAL VASCULAR (CUSTOM PROCEDURE TRAY) ×2 IMPLANT
PAD ARMBOARD 7.5X6 YLW CONV (MISCELLANEOUS) ×4 IMPLANT
PADDING CAST COTTON 6X4 STRL (CAST SUPPLIES) IMPLANT
PENCIL BUTTON HOLSTER BLD 10FT (ELECTRODE) ×2 IMPLANT
SET COLLECT BLD 21X3/4 12 (NEEDLE) IMPLANT
SPONGE LAP 18X18 X RAY DECT (DISPOSABLE) ×2 IMPLANT
STOPCOCK 4 WAY LG BORE MALE ST (IV SETS) IMPLANT
SUT PROLENE 6 0 BV (SUTURE) IMPLANT
SUT PROLENE 6 0 C 1 24 (SUTURE) ×2 IMPLANT
SUT PROLENE 6 0 CC (SUTURE) ×6 IMPLANT
SUT PROLENE 7 0 BV 1 (SUTURE) ×2 IMPLANT
SUT PROLENE 7 0 BV1 MDA (SUTURE) IMPLANT
SUT SILK 2 0 SH (SUTURE) ×2 IMPLANT
SUT SILK 3 0 (SUTURE)
SUT SILK 3-0 18XBRD TIE 12 (SUTURE) IMPLANT
SUT SILK 4 0 (SUTURE) ×1
SUT SILK 4-0 18XBRD TIE 12 (SUTURE) ×1 IMPLANT
SUT VIC AB 2-0 CT1 27 (SUTURE) ×1
SUT VIC AB 2-0 CT1 TAPERPNT 27 (SUTURE) ×1 IMPLANT
SUT VIC AB 2-0 CTX 36 (SUTURE) ×4 IMPLANT
SUT VIC AB 3-0 SH 27 (SUTURE) ×5
SUT VIC AB 3-0 SH 27X BRD (SUTURE) ×5 IMPLANT
SUT VICRYL 4-0 PS2 18IN ABS (SUTURE) ×6 IMPLANT
SYR TB 1ML LUER SLIP (SYRINGE) ×2 IMPLANT
TOWEL OR 17X24 6PK STRL BLUE (TOWEL DISPOSABLE) ×4 IMPLANT
TOWEL OR 17X26 10 PK STRL BLUE (TOWEL DISPOSABLE) ×4 IMPLANT
TRAY FOLEY CATH 14FRSI W/METER (CATHETERS) ×2 IMPLANT
TUBING EXTENTION W/L.L. (IV SETS) IMPLANT
UNDERPAD 30X30 INCONTINENT (UNDERPADS AND DIAPERS) ×2 IMPLANT
WATER STERILE IRR 1000ML POUR (IV SOLUTION) ×2 IMPLANT

## 2012-08-17 NOTE — Telephone Encounter (Signed)
LVM re appt info, sent letter - kf °

## 2012-08-17 NOTE — Anesthesia Procedure Notes (Signed)
Procedure Name: Intubation Date/Time: 08/17/2012 7:40 AM Performed by: Lovie Chol Pre-anesthesia Checklist: Patient identified, Emergency Drugs available, Suction available, Patient being monitored and Timeout performed Patient Re-evaluated:Patient Re-evaluated prior to inductionOxygen Delivery Method: Circle system utilized Preoxygenation: Pre-oxygenation with 100% oxygen Intubation Type: IV induction Ventilation: Mask ventilation without difficulty and Oral airway inserted - appropriate to patient size Laryngoscope Size: Miller and 2 Grade View: Grade I Tube type: Oral Tube size: 7.5 mm Number of attempts: 1 Airway Equipment and Method: Stylet and LTA kit utilized Placement Confirmation: ETT inserted through vocal cords under direct vision,  positive ETCO2,  CO2 detector and breath sounds checked- equal and bilateral Secured at: 22 cm Tube secured with: Tape Dental Injury: Teeth and Oropharynx as per pre-operative assessment

## 2012-08-17 NOTE — Anesthesia Preprocedure Evaluation (Signed)
Anesthesia Evaluation  Patient identified by MRN, date of birth, ID band Patient awake    Airway       Dental   Pulmonary          Cardiovascular hypertension, + angina + CAD, + Peripheral Vascular Disease and +CHF     Neuro/Psych    GI/Hepatic   Endo/Other    Renal/GU      Musculoskeletal   Abdominal   Peds  Hematology   Anesthesia Other Findings   Reproductive/Obstetrics                           Anesthesia Physical Anesthesia Plan  ASA: III  Anesthesia Plan: General   Post-op Pain Management:    Induction: Intravenous  Airway Management Planned: Oral ETT  Additional Equipment:   Intra-op Plan:   Post-operative Plan: Extubation in OR  Informed Consent: I have reviewed the patients History and Physical, chart, labs and discussed the procedure including the risks, benefits and alternatives for the proposed anesthesia with the patient or authorized representative who has indicated his/her understanding and acceptance.     Plan Discussed with: CRNA, Anesthesiologist and Surgeon  Anesthesia Plan Comments:         Anesthesia Quick Evaluation

## 2012-08-17 NOTE — Progress Notes (Signed)
ANTIBIOTIC CONSULT NOTE - INITIAL  Pharmacy Consult for adjustment of antibiotics for renal function Indication: post-op prophylaxis  No Known Allergies  Patient Measurements: Weight: 184 lb 15.5 oz (83.9 kg) ABW 69.4 kg  Assessment: 62 y/o male s/p R fem-pop bypass graft to receive post-op Zinacef. CrCl ~57 ml/min. Antibiotic appropriate for renal function.  Plan:  -Continue with Zinacef 1.5 g IV q12h for 2 doses -Pharmacy to follow behind physician dosing heparin  [facility], [address].D., BCPS Clinical Pharmacist Pager: [phone] 08/17/2012 5:36 PM

## 2012-08-17 NOTE — Op Note (Signed)
OPERATIVE REPORT  Date of Surgery: 08/17/2012  Surgeon: Josephina Gip, MD  Assistant: Lorrine Kin, Clearence Ped   Pre-op Diagnosis: Right Femoral Popliteal and tibial occlusive disease with severe claudication and rest ischemia Post-op Diagnosis: Same Procedure: Procedure(s): BYPASS GRAFT FEMORAL-POPLITEAL ARTERY bypass using a nonreversible translocated saphenous vein graft from right leg with intraoperative arteriogram x2 Anesthesia: General  EBL: 100 cc  Complications: None  Procedure Details: The patient was taken to the operating room placed in the supine position at which time satisfactory general endotracheal anesthesia was administered. The right leg was prepped with Betadine scrub and solution draped in routine sterile manner. Saphenous vein was then exposed at the saphenofemoral junction and through multiple incisions along the medial aspect of the right leg it was removed down to the mid calf. It was a good vein proximally about the proximal third and then became smaller and borderline. Also traced a deeper branch down to the mid to distal thigh which was of similar size. It was removed his branches ligated between 4-0 silk ties and divided gently dilated with heparinized saline and marked for orientation purposes. Satisfactory in size but no more length was available distally. The popliteal artery was exposed below the knee where it was a diffusely diseased vessel and under the occluded proximally but did become patent in the distal 2-3 cm filling the anterior tibial artery which was the only runoff vessel and it was known to be disease as well. The anterior tibial and tibioperoneal trunk were encircled with Vesseloops. Common superficial and profunda femoris arteries were then dissected free and the inguinal wound. There was diffuse severe calcific disease in the superficial femoral artery although it was patent common femoral artery was soft anteriorly with good pulse.  Subfascial anatomic tunnel was created and the patient was heparinized. Femoral vessels were occluded with vascular clamps a longitudinal opening made in the very distal aspect of the common femoral artery a 15 blade extended with Potts scissors. There was good inflow. The proximal end vein spatulated slightly anastomosed end to side with 6-0 Prolene. Clamps were then released there was good pulse and the first set of competent valves. Using a retrograde valvulotome the valves were rendered incompetent with resultant satisfactory flow into the distal end of the vein graft. Vein was carefully delivered through an anatomic tunnel popliteal artery was then opened with 15 blade extended with the Potts scissors down to just proximal to the origin of the anterior tibial. This was as long as the vein would reach so there was no option to go distally. As noted there was diffuse disease in all areas. The popliteal lumen was satisfactory and was able to get a Fogarty catheter to go down the anterior tibial to the ankle but it was a diffusely diseased vessel. Vein was carefully measured spatulated and anastomosed end to side with 6-0 Prolene. Vessel loops then released there was a good pulse in the vein graft. The Doppler flow was improved with time in the operating room and was brisk at the ankle level. Intraoperative arteriogram revealed as we knew severe disease particularly in the anterior tibial artery in its proximal portion. I repeated the angiogram and there was flow through this area and the Doppler flow in the ankle and proved as we were closing. The patient on other options of removing the vein graft in the tunnel and extending down further on the anterior tibial artery but there was not any satisfactory vein for this. Since about Doppler flow did  seem to improve with time and there was an excellent pulse the vein graft decided to watch this. Adequate hemostasis was achieved. No protamine was given. The wounds were  all closed in layers of Vicryl subcuticular fashion and Dermabond patient taken to recovery in stable condition  Josephina Gip, MD 08/17/2012 11:53 AM

## 2012-08-17 NOTE — Preoperative (Signed)
Beta Blockers   Reason not to administer Beta Blockers:Not Applicable 

## 2012-08-17 NOTE — Anesthesia Postprocedure Evaluation (Signed)
  Anesthesia Post-op Note  Patient: Paul Mullins  Procedure(s) Performed: Procedure(s) with comments: BYPASS GRAFT FEMORAL-POPLITEAL ARTERY (Right) - using non reversed Sapphenous vein with intraoperative arteriogram.  Patient Location: PACU  Anesthesia Type:General  Level of Consciousness: awake, oriented and patient cooperative  Airway and Oxygen Therapy: Patient Spontanous Breathing  Post-op Pain: mild  Post-op Assessment: Post-op Vital signs reviewed, Patient's Cardiovascular Status Stable, Respiratory Function Stable, Patent Airway, No signs of Nausea or vomiting and Pain level controlled  Post-op Vital Signs: stable  Complications: No apparent anesthesia complications

## 2012-08-17 NOTE — Progress Notes (Addendum)
Pt arrived from PACU, still hypertensive. Admin 10mg  labetalol & 4mg  morphine. IVF KVO (CHF Hx). Paging DR. Will continue to monitor. BP down to 159 sys. Addn labetalol to keep sys < 180.

## 2012-08-17 NOTE — Transfer of Care (Signed)
Immediate Anesthesia Transfer of Care Note  Patient: Paul Mullins  Procedure(s) Performed: Procedure(s) with comments: BYPASS GRAFT FEMORAL-POPLITEAL ARTERY (Right) - using non reversed Sapphenous vein with intraoperative arteriogram.  Patient Location: PACU  Anesthesia Type:General  Level of Consciousness: awake, oriented and patient cooperative  Airway & Oxygen Therapy: Patient Spontanous Breathing and Patient connected to face mask oxygen  Post-op Assessment: Report given to PACU RN and Post -op Vital signs reviewed and stable  Post vital signs: Reviewed and stable  Complications: No apparent anesthesia complications

## 2012-08-17 NOTE — H&P (Signed)
Vascular Surgery H&P  Chief Complaint: Severe claudication right leg  HPI: Paul Mullins is a 62 y.o. male who presents for evaluation of severe claudication right leg. Patient has occasional rest ischemia and unable to walk longer than 30-40 yards . Occasionally must hindfoot outside of bed to relieve discomfort.   Past Medical History  Diagnosis Date  . Hypertension   . Anginal pain   . Peripheral vascular disease   . Chronic systolic CHF (congestive heart failure)     [facility]; Dr. Lora Havens  . Coronary artery disease     08/08/12 - patient denied CAD, but is followed for CHF  . 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 Heart murmur     "since birth"; small membraneous VSD by 01/2012 echo   Past Surgical History  Procedure Laterality Date  . Neck surgery    . Neck surgery    . Stones    . Gallstones     History   Social History  . Marital Status: Single    Spouse Name: N/A    Number of Children: N/A  . Years of Education: N/A   Social History Main Topics  . Smoking status: Former Smoker    Types: Cigarettes    Quit date: 06/09/2012  . Smokeless tobacco: Never Used  . Alcohol Use: No  . Drug Use: No  . Sexually Active: Yes   Other Topics Concern  . None   Social History Narrative  . None   History reviewed. No pertinent family history. No Known Allergies Prior to Admission medications   Medication Sig Start Date End Date Taking? Authorizing Provider  amLODipine (NORVASC) 10 MG tablet Take 10 mg by mouth daily.   Yes Historical Provider, MD  carvedilol (COREG) 25 MG tablet Take 25 mg by mouth 2 (two) times daily with a meal.   Yes Historical Provider, MD  cloNIDine (CATAPRES) 0.1 MG tablet Take 0.1 mg by mouth 2 (two) times daily.   Yes Historical Provider, MD  clopidogrel (PLAVIX) 75 MG tablet Take 75 mg by mouth daily.   Yes Historical Provider, MD  hydrALAZINE  (APRESOLINE) 100 MG tablet Take 100 mg by mouth 3 (three) times daily.   Yes Historical Provider, MD  isosorbide mononitrate (IMDUR) 60 MG 24 hr tablet Take 90 mg by mouth daily.    Yes Historical Provider, MD  nitroGLYCERIN (NITROSTAT) 0.4 MG SL tablet Place 0.4 mg under the tongue every 5 (five) minutes as needed for chest pain.   Yes Historical Provider, MD  pravastatin (PRAVACHOL) 40 MG tablet Take 40 mg by mouth daily.   Yes Historical Provider, MD     Positive ROS: Denies active chest pain but does have occasional dyspnea on exertion. No hemoptysis. No rest pain left lower extremity.  All other systems have been reviewed and were otherwise negative with the exception of those mentioned in the HPI and as above.  Physical Exam: Filed Vitals:   08/17/12 0640  BP: 155/90  Pulse: 91  Temp:   Resp:     General: Alert, no acute distress HEENT: Normal for age Cardiovascular: Regular rate and rhythm. Carotid pulses 2+, no bruits audible Respiratory: Clear to auscultation. No cyanosis, no use of accessory musculature GI: No organomegaly, abdomen is soft and non-tender Skin: No lesions in the area of chief complaint Neurologic: Sensation intact distally Psychiatric: Patient is competent for consent with normal mood and affect  Musculoskeletal: No obvious deformities Extremities: Right leg with 3+ femoral pulse no popliteal or distal pulse palpable.    Imaging reviewed: Recent angiogram reveals right superficial femoral occlusion with reconstitution popliteal artery below the knee and one-vessel runoff the anterior tibial artery   Assessment/Plan:  And right femoral-popliteal bypass graft with saphenous vein if adequate if not we'll use synthetic Gore-Tex graft Risks and benefits thoroughly discussed with patient and he would like to proceed   Josephina Gip, MD 08/17/2012 7:34 AM

## 2012-08-17 NOTE — Telephone Encounter (Signed)
Message copied by Margaretmary Eddy on Fri Aug 17, 2012  1:16 PM ------      Message from: Sharee Pimple      Created: Fri Aug 17, 2012 12:27 PM      Regarding: Schedule                   ----- Message -----         From: Dara Lords, PA-C         Sent: 08/17/2012  12:03 PM           To: Sharee Pimple, CMA            S/p right fem pop bypass with saphenous vein 08/17/12.  F/u with Dr. Hart Rochester in 2 weeks.             Thanks,      Lelon Mast ------

## 2012-08-18 ENCOUNTER — Encounter (HOSPITAL_COMMUNITY): Payer: Self-pay | Admitting: *Deleted

## 2012-08-18 DIAGNOSIS — I739 Peripheral vascular disease, unspecified: Secondary | ICD-10-CM

## 2012-08-18 LAB — CBC
Hemoglobin: 12.2 g/dL — ABNORMAL LOW (ref 13.0–17.0)
MCH: 29.9 pg (ref 26.0–34.0)
MCV: 90.2 fL (ref 78.0–100.0)
RBC: 4.08 MIL/uL — ABNORMAL LOW (ref 4.22–5.81)
WBC: 5.6 10*3/uL (ref 4.0–10.5)

## 2012-08-18 LAB — BASIC METABOLIC PANEL
CO2: 26 mEq/L (ref 19–32)
Calcium: 8.9 mg/dL (ref 8.4–10.5)
Chloride: 100 mEq/L (ref 96–112)
Creatinine, Ser: 1.46 mg/dL — ABNORMAL HIGH (ref 0.50–1.35)
Glucose, Bld: 107 mg/dL — ABNORMAL HIGH (ref 70–99)
Sodium: 136 mEq/L (ref 135–145)

## 2012-08-18 MED ORDER — SODIUM CHLORIDE 0.9 % IV SOLN
INTRAVENOUS | Status: DC
  Administered 2012-08-18 (×2): via INTRAVENOUS

## 2012-08-18 NOTE — Progress Notes (Addendum)
VASCULAR LAB PRELIMINARY  ARTERIAL  ABI completed:    RIGHT    LEFT    PRESSURE WAVEFORM  PRESSURE WAVEFORM  BRACHIAL  T BRACHIAL 110 T  DP   DP    AT 107 DM AT 137 DM  PT 52 Severe DM PT 112 DM  PER   PER    GREAT TOE  NA GREAT TOE  NA    RIGHT LEFT  ABI 0.97 >1.0     Helix Lafontaine, RVT 08/18/2012, 11:30 AM

## 2012-08-18 NOTE — Evaluation (Signed)
Physical Therapy Evaluation Patient Details Name: Paul Mullins MRN: [id number] DOB: 07/28/1950 Today's Date: 08/18/2012 Time: 2130-8657 PT Time Calculation (min): 23 min  PT Assessment / Plan / Recommendation Clinical Impression  Patient is a 62 yo male admitted with severe claudication RLE, s/p Fem-Pop BPG.  Pain limiting mobility today. Will benefit from acute PT to maximize independence prior to discharge home.    PT Assessment  Patient needs continued PT services    Follow Up Recommendations  No PT follow up;Supervision/Assistance - 24 hour (If patient continues to progress well with PT)    Does the patient have the potential to tolerate intense rehabilitation      Barriers to Discharge None .    Equipment Recommendations  Rolling walker with 5" wheels    Recommendations for Other Services     Frequency Min 3X/week    Precautions / Restrictions Precautions Precautions: Fall Restrictions Weight Bearing Restrictions: No   Pertinent Vitals/Pain Pain with ambulation limiting mobility      Mobility  Bed Mobility Bed Mobility: Sit to Supine Sit to Supine: 4: Min assist Details for Bed Mobility Assistance: Verbal cues for technique.  Assist to raise RLE onto bed. Transfers Transfers: Sit to Stand;Stand to Sit Sit to Stand: 4: Min assist;With upper extremity assist;With armrests;From chair/3-in-1 Stand to Sit: 4: Min assist;With upper extremity assist;To bed Details for Transfer Assistance: Verbal cues for hand placement.  Assist to rise to standing, and for safety/balance. Ambulation/Gait Ambulation/Gait Assistance: 4: Min assist (+1 for lines) Ambulation Distance (Feet): 68 Feet Assistive device: Rolling walker Ambulation/Gait Assistance Details: Verbal cues for safe use of RW.  Cues to use UE's to take weight off of RLE.  Cues to stand upright during gait. Gait Pattern: Step-to pattern;Decreased stance time - right;Decreased step length - left;Antalgic;Trunk  flexed Gait velocity: Slow gait speed    Exercises     PT Diagnosis: Difficulty walking;Abnormality of gait;Acute pain  PT Problem List: Decreased strength;Decreased activity tolerance;Decreased balance;Decreased mobility;Decreased knowledge of use of DME;Decreased safety awareness;Pain PT Treatment Interventions: DME instruction;Gait training;Functional mobility training;Patient/family education   PT Goals Acute Rehab PT Goals PT Goal Formulation: With patient Time For Goal Achievement: 08/25/12 Potential to Achieve Goals: Good Pt will go Supine/Side to Sit: with supervision;with HOB 0 degrees PT Goal: Supine/Side to Sit - Progress: Goal set today Pt will go Sit to Supine/Side: with supervision;with HOB 0 degrees PT Goal: Sit to Supine/Side - Progress: Goal set today Pt will go Sit to Stand: with supervision;with upper extremity assist PT Goal: Sit to Stand - Progress: Goal set today Pt will Ambulate: 51 - 150 feet;with supervision;with rolling walker PT Goal: Ambulate - Progress: Goal set today  Visit Information  Last PT Received On: 08/18/12 Assistance Needed: +2    Subjective Data  Subjective: "It hurts when I walk on it" (RLE) Patient Stated Goal: To decrease pain.   Prior Functioning  Home Living Lives With: Significant other Available Help at Discharge: Family;Available 24 hours/day Type of Home: Apartment Home Access: Level entry Home Layout: One level Bathroom Shower/Tub: Engineer, manufacturing systems: Standard Home Adaptive Equipment: None Prior Function Level of Independence: Independent;Needs assistance Needs Assistance: Light Housekeeping Light Housekeeping: Moderate Able to Take Stairs?: Yes Driving: Yes Communication Communication: No difficulties    Cognition  Cognition Arousal/Alertness: Awake/alert Behavior During Therapy: WFL for tasks assessed/performed Overall Cognitive Status: Within Functional Limits for tasks assessed     Extremity/Trunk Assessment Right Upper Extremity Assessment RUE ROM/Strength/Tone: [facility] for tasks assessed RUE  Sensation: WFL - Light Touch Left Upper Extremity Assessment LUE ROM/Strength/Tone: WFL for tasks assessed LUE Sensation: WFL - Light Touch Right Lower Extremity Assessment RLE ROM/Strength/Tone: Deficits RLE ROM/Strength/Tone Deficits: Strength 4-/5 - limited by pain Left Lower Extremity Assessment LLE ROM/Strength/Tone: WFL for tasks assessed LLE Sensation: WFL - Light Touch Trunk Assessment Trunk Assessment: Normal   Balance    End of Session PT - End of Session Equipment Utilized During Treatment: Gait belt Activity Tolerance: Patient limited by pain;Patient limited by fatigue Patient left: in bed;with call bell/phone within reach Nurse Communication: Mobility status  GP     Vena Austria 08/18/2012, 4:15 PM Durenda Hurt. Renaldo Fiddler, [facility] Acute Rehab Services Pager [phone]

## 2012-08-18 NOTE — Progress Notes (Signed)
I agree with the above. The patient will be transferred to 2000. Continuous IV hydration given that his urine output has dropped off. He has a palpable pedal pulse currently. He will be continued on IV heparin.  Durene Cal

## 2012-08-18 NOTE — Progress Notes (Signed)
[facility] Progress Note  08/18/2012 8:05 AM 1 Day Post-Op  Subjective:  No complaints this am  Afebrile 130's-200's systolic yesterday, but this has been more manageable since receiving home meds yesterday.  Did need hydralazine/labatolol yesterday afternoon.  Filed Vitals:   08/18/12 0400  BP: 142/60  Pulse: 74  Temp: 98.2 F (36.8 C)  Resp: 19    Physical Exam: Incisions:  Right groin is covered.  Other incisions are c/d/i with some erythema around them. Extremities:  + doppler signal in the right AT/DP; right foot is warm.  CBC    Component Value Date/Time   WBC 5.6 08/18/2012 0415   RBC 4.08* 08/18/2012 0415   HGB 12.2* 08/18/2012 0415   HCT 36.8* 08/18/2012 0415   PLT 183 08/18/2012 0415   MCV 90.2 08/18/2012 0415   MCH 29.9 08/18/2012 0415   MCHC 33.2 08/18/2012 0415   RDW 14.8 08/18/2012 0415    BMET    Component Value Date/Time   NA 136 08/18/2012 0415   K 3.8 08/18/2012 0415   CL 100 08/18/2012 0415   CO2 26 08/18/2012 0415   GLUCOSE 107* 08/18/2012 0415   BUN 16 08/18/2012 0415   CREATININE 1.46* 08/18/2012 0415   CALCIUM 8.9 08/18/2012 0415   GFRNONAA 50* 08/18/2012 0415   GFRAA 58* 08/18/2012 0415    INR    Component Value Date/Time   INR 0.89 08/08/2012 1259     Intake/Output Summary (Last 24 hours) at 08/18/12 0805 Last data filed at 08/18/12 0600  Gross per 24 hour  Intake 2334.75 ml  Output   2885 ml  Net -550.25 ml   PTT 32  Assessment/Plan:  62 y.o. 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  Has had good UOP, but has dropped off over the past few hours.  Will recheck BMP in am. -NaCl ordered at 75/hr yesterday-this was discontinued yesterday-  Restart- d/w Dr. Myra Gianotti and will restart. -DVT prophylaxis: heparin gtt -pt on plavix at home - will restart when okay with Dr.  Shawnie Dapper -mobilize today -transfer to 2000   Doreatha Massed, PA-C [facility] [phone] 08/18/2012 8:05 AM

## 2012-08-19 LAB — TYPE AND SCREEN: Unit division: 0

## 2012-08-19 LAB — BASIC METABOLIC PANEL
BUN: 19 mg/dL (ref 6–23)
Calcium: 8.5 mg/dL (ref 8.4–10.5)
Chloride: 102 mEq/L (ref 96–112)
Creatinine, Ser: 1.29 mg/dL (ref 0.50–1.35)
GFR calc Af Amer: 67 mL/min — ABNORMAL LOW (ref >90)
GFR calc non Af Amer: 58 mL/min — ABNORMAL LOW (ref >90)

## 2012-08-19 LAB — APTT: aPTT: 41 seconds — ABNORMAL HIGH (ref 24–37)

## 2012-08-19 NOTE — Progress Notes (Signed)
[facility] [place]  Subjective  - POD #2  Feels well this morning. Complaining of pain at the incision sites   Physical Exam:  Right leg is warm and well perfused. Incisions are healing nicely.       Assessment/Plan:  POD #2   Postoperative renal dysfunction has resolved with IV hydration. I will stop his IV fluids today. The patient needs to continue to be out of bed ambulating. The patient will be able to be discharged within the next one to 2 days. She continues to be on low-dose IV heparin. Dr. Cyril Mourning will make recommendations for future need for anticoagulation tomorrow  BRABHAM IV, V. WELLS 08/19/2012 10:42 AM --  Filed Vitals:   08/19/12 1034  BP: 156/61  Pulse: 81  Temp:   Resp:     Intake/Output Summary (Last 24 hours) at 08/19/12 1042 Last data filed at 08/19/12 1037  Gross per 24 hour  Intake    915 ml  Output    775 ml  Net    140 ml     Laboratory CBC    Component Value Date/Time   WBC 5.6 08/18/2012 0415   HGB 12.2* 08/18/2012 0415   HCT 36.8* 08/18/2012 0415   PLT 183 08/18/2012 0415    BMET    Component Value Date/Time   NA 135 08/19/2012 0553   K 3.2* 08/19/2012 0553   CL 102 08/19/2012 0553   CO2 23 08/19/2012 0553   GLUCOSE 103* 08/19/2012 0553   BUN 19 08/19/2012 0553   CREATININE 1.29 08/19/2012 0553   CALCIUM 8.5 08/19/2012 0553   GFRNONAA 58* 08/19/2012 0553   GFRAA 67* 08/19/2012 0553    COAG Lab Results  Component Value Date   INR 0.89 08/08/2012   No results found for this basename: PTT    Antibiotics Anti-infectives   Start     Dose/Rate Route Frequency Ordered Stop   08/17/12 1730  cefUROXime (ZINACEF) 1.5 g in dextrose 5 % 50 mL IVPB     1.5 g 100 mL/hr over 30 Minutes Intravenous Every 12 hours 08/17/12 1719 08/18/12 0554   08/16/12 1303  cefUROXime (ZINACEF) 1.5 g in dextrose 5 % 50 mL IVPB  Status:  Discontinued     1.5 g 100 mL/hr over 30 Minutes Intravenous 30 min pre-op 08/16/12 1303 08/17/12 0622        V. Charlena Cross, M.D. [facility] [place] Office: [phone] Pager:  [phone]

## 2012-08-19 NOTE — Progress Notes (Signed)
Utilization Review Completed.   Gene Glazebrook, RN, BSN Nurse Case Manager  [phone]  

## 2012-08-19 NOTE — Progress Notes (Signed)
Physical Therapy Treatment Patient Details Name: Paul Mullins MRN: [id number] DOB: November 03, 1950 Today's Date: 08/19/2012 Time: 7829-5621 PT Time Calculation (min): 24 min  PT Assessment / Plan / Recommendation Comments on Treatment Session  Patient making gains with mobility.  Max encouragement required for participation.    Follow Up Recommendations  No PT follow up;Supervision/Assistance - 24 hour     Does the patient have the potential to tolerate intense rehabilitation     Barriers to Discharge        Equipment Recommendations  Rolling walker with 5" wheels    Recommendations for Other Services    Frequency Min 3X/week   Plan Discharge plan remains appropriate;Frequency remains appropriate    Precautions / Restrictions Precautions Precautions: Fall Restrictions Weight Bearing Restrictions: No   Pertinent Vitals/Pain Pain continues to limit mobility    Mobility  Bed Mobility Bed Mobility: Supine to Sit Supine to Sit: 4: Min guard;With rails Details for Bed Mobility Assistance: Verbal cues for technique.  Assist for safety Transfers Transfers: Sit to Stand;Stand to Sit Sit to Stand: 4: Min guard;With upper extremity assist;From bed Stand to Sit: 4: Min guard;With upper extremity assist;With armrests;To chair/3-in-1 Details for Transfer Assistance: Verbal cues for hand placement and placement of rt foot.  Assist for balance/safety Ambulation/Gait Ambulation/Gait Assistance: 4: Min assist Ambulation Distance (Feet): 120 Feet Assistive device: Rolling walker Ambulation/Gait Assistance Details: Verbal cues for safe use of RW.  Cues to stay close to RW.  Patient walking on toes on Rt foot, just touching foot to floor.  Verbal cues to place foot flat on floor and bear some weight - to use a more normal gait pattern. Gait Pattern: Step-to pattern;Decreased stance time - right;Decreased step length - left;Antalgic;Trunk flexed;Decreased dorsiflexion - right;Decreased weight  shift to right Gait velocity: Slow gait speed      PT Goals Acute Rehab PT Goals Pt will go Supine/Side to Sit: with supervision;with HOB 0 degrees PT Goal: Supine/Side to Sit - Progress: Progressing toward goal Pt will go Sit to Stand: with supervision;with upper extremity assist PT Goal: Sit to Stand - Progress: Progressing toward goal Pt will Ambulate: 51 - 150 feet;with supervision;with rolling walker PT Goal: Ambulate - Progress: Progressing toward goal  Visit Information  Last PT Received On: 08/19/12 Assistance Needed: +1    Subjective Data  Subjective: "I think I need to stay in bed until after supper"  Max encouragement and patient agreed to walk.   Cognition  Cognition Arousal/Alertness: Awake/alert Behavior During Therapy: WFL for tasks assessed/performed Overall Cognitive Status: Within Functional Limits for tasks assessed    Balance     End of Session PT - End of Session Equipment Utilized During Treatment: Gait belt Activity Tolerance: Patient limited by pain;Patient limited by fatigue Patient left: in chair;with call bell/phone within reach;with nursing in room Nurse Communication: Mobility status   GP     Vena Austria 08/19/2012, 4:44 PM Durenda Hurt. Renaldo Fiddler, [facility] Acute Rehab Services Pager [phone]

## 2012-08-20 ENCOUNTER — Encounter (HOSPITAL_COMMUNITY): Payer: Self-pay | Admitting: Vascular Surgery

## 2012-08-20 LAB — APTT: aPTT: 41 seconds — ABNORMAL HIGH (ref 24–37)

## 2012-08-20 NOTE — Evaluation (Signed)
Occupational Therapy Evaluation Patient Details Name: Paul Mullins MRN: [id number] DOB: [birth year] Today's Date: 08/20/2012 Time: 4098-1191 OT Time Calculation (min): 12 min  OT Assessment / Plan / Recommendation Clinical Impression   This 62 y.o. Male admitted with severe claudication Rt. LE and underwent Rt. Fem-pop BPG.  Pt. Is for discharge home tomorrow.  Currently, he requires supervision - modified independent with BADLs, and pt with good family support.  He is not able to perform tub transfer due to pain Rt. LE, but will sponge bathe until he is able to weight bear fully through Rt. LE.  Pt with good support at discharge.  No further OT needs identified.     OT Assessment  Patient does not need any further OT services    Follow Up Recommendations  No OT follow up;Supervision - Intermittent    Barriers to Discharge      Equipment Recommendations  None recommended by OT    Recommendations for Other Services    Frequency       Precautions / Restrictions Precautions Precautions: None Restrictions Weight Bearing Restrictions: No   Pertinent Vitals/Pain     ADL  Eating/Feeding: Independent Where Assessed - Eating/Feeding: Chair Grooming: Wash/dry hands;Wash/dry face;Teeth care;Supervision/safety Where Assessed - Grooming: Supported standing Upper Body Bathing: Set up Where Assessed - Upper Body Bathing: Supported sit to stand Lower Body Bathing: Supervision/safety Where Assessed - Lower Body Bathing: Supported sit to stand Upper Body Dressing: Set up Where Assessed - Upper Body Dressing: Unsupported sitting Lower Body Dressing: Supervision/safety Where Assessed - Lower Body Dressing: Supported sit to Pharmacist, hospital: Supervision/safety Statistician Method: Sit to Barista: Regular height toilet;Comfort height toilet Toileting - Architect and Hygiene: Supervision/safety Where Assessed - Engineer, mining and  Hygiene: Standing Tub/Shower Transfer: +1 Total assistance (pt unable) Tub/Shower Transfer Method: Ambulating Equipment Used: Rolling walker Transfers/Ambulation Related to ADLs: ambulates mod I ADL Comments: Pt able to access feet for LB ADLs by externally rotating hip onto bed.  Pt reports increased pain with this.  Attempted simulated tub transfer, but pt unable to tolerate FWBing over Rt. LE as needed to step into tub.  Discussed options for tub DME, but pt reports he will sponge bathe until he is able to step into the tub    OT Diagnosis:    OT Problem List:   OT Treatment Interventions:     OT Goals    Visit Information  Last OT Received On: 08/20/12 Assistance Needed: +1    Subjective Data  Subjective: "My girl will have to help me with it then"  re: bathing due to not being able to step into the tub Patient Stated Goal: To go home   Prior Functioning     Home Living Lives With: Significant other Available Help at Discharge: Family;Available 24 hours/day Type of Home: Apartment Home Access: Level entry Home Layout: One level Bathroom Shower/Tub: Tub/shower unit;Curtain Bathroom Toilet: Standard Home Adaptive Equipment: None Prior Function Level of Independence: Independent;Needs assistance Needs Assistance: Light Housekeeping Light Housekeeping: Moderate Able to Take Stairs?: Yes Driving: Yes Communication Communication: No difficulties         Vision/Perception Perception Perception: Within Functional Limits   Cognition  Cognition Arousal/Alertness: Awake/alert Behavior During Therapy: WFL for tasks assessed/performed Overall Cognitive Status: Within Functional Limits for tasks assessed    Extremity/Trunk Assessment Right Upper Extremity Assessment RUE ROM/Strength/Tone: WFL for tasks assessed RUE Coordination: WFL - gross/fine motor Left Upper Extremity Assessment LUE ROM/Strength/Tone: [facility] for  tasks assessed LUE Coordination: WFL - gross/fine  motor Trunk Assessment Trunk Assessment: Normal     Mobility Bed Mobility Bed Mobility: Supine to Sit;Sitting - Scoot to Edge of Bed;Sit to Supine Supine to Sit: 6: Modified independent (Device/Increase time);HOB flat Sitting - Scoot to Edge of Bed: 6: Modified independent (Device/Increase time) Sit to Supine: 6: Modified independent (Device/Increase time);HOB flat Details for Bed Mobility Assistance: incr time Transfers Transfers: Sit to Stand;Stand to Sit Sit to Stand: 6: Modified independent (Device/Increase time);With upper extremity assist;From bed Stand to Sit: 6: Modified independent (Device/Increase time);Not tested (comment);To bed     Exercise     Balance Balance Balance Assessed: Yes Static Standing Balance Static Standing - Balance Support: No upper extremity supported Static Standing - Level of Assistance: 6: Modified independent (Device/Increase time)   End of Session OT - End of Session Activity Tolerance: Patient tolerated treatment well Patient left: in bed;with call bell/phone within reach  GO     Paul Mullins M 08/20/2012, 2:57 PM

## 2012-08-20 NOTE — Progress Notes (Signed)
Physical Therapy Treatment Patient Details Name: Paul Mullins MRN: [id number] DOB: [birth year] Today's Date: 08/20/2012 Time: 4098-1191 PT Time Calculation (min): 8 min  PT Assessment / Plan / Recommendation Comments on Treatment Session  Pt has made good progress with mobilty.  Has met PT goals.  Encouraged pt to continue amb after dc.    Follow Up Recommendations  No PT follow up     Does the patient have the potential to tolerate intense rehabilitation     Barriers to Discharge        Equipment Recommendations  Rolling walker with 5" wheels (pt received.)    Recommendations for Other Services    Frequency     Plan All goals met and education completed, patient dischaged from PT services    Precautions / Restrictions Precautions Precautions: None   Pertinent Vitals/Pain Rt leg soreness.    Mobility  Bed Mobility Supine to Sit: 6: Modified independent (Device/Increase time) Sit to Supine: 6: Modified independent (Device/Increase time) Details for Bed Mobility Assistance: incr time Transfers Sit to Stand: 6: Modified independent (Device/Increase time);With upper extremity assist;From bed Stand to Sit: 6: Modified independent (Device/Increase time);With upper extremity assist;To bed Ambulation/Gait Ambulation/Gait Assistance: 6: Modified independent (Device/Increase time) Ambulation Distance (Feet): 160 Feet Assistive device: Rolling walker Ambulation/Gait Assistance Details: Pt walking with foot flat on floor. Gait Pattern: Step-through pattern;Decreased step length - left;Decreased stance time - right    Exercises     PT Diagnosis:    PT Problem List:   PT Treatment Interventions:     PT Goals Acute Rehab PT Goals PT Goal: Supine/Side to Sit - Progress: Met PT Goal: Sit to Supine/Side - Progress: Met PT Goal: Sit to Stand - Progress: Met PT Goal: Ambulate - Progress: Met  Visit Information  Last PT Received On: 08/20/12 Assistance Needed: +1     Subjective Data  Subjective: Pt states he was almost asleep.   Cognition  Cognition Arousal/Alertness: Awake/alert Behavior During Therapy: WFL for tasks assessed/performed Overall Cognitive Status: Within Functional Limits for tasks assessed    Balance  Balance Balance Assessed: Yes Static Standing Balance Static Standing - Balance Support: No upper extremity supported Static Standing - Level of Assistance: 6: Modified independent (Device/Increase time)  End of Session PT - End of Session Activity Tolerance: Patient tolerated treatment well Patient left: in bed;with call bell/phone within reach Nurse Communication: Mobility status   GP     [facility] 08/20/2012, 2:41 PM  [facility] PT [phone]

## 2012-08-20 NOTE — Progress Notes (Addendum)
[facility] Progress Note  08/20/2012 7:33 AM 3 Days Post-Op  Subjective:  Still a little sore.  Tm 99.2 now afebrile VSS  Filed Vitals:   08/20/12 0549  BP: 158/72  Pulse: 81  Temp: 98.7 F (37.1 C)  Resp: 18    Physical Exam: Incisions:  All incisions are c/d/i Extremities:  Right foot is warm and well perfused.  CBC    Component Value Date/Time   WBC 5.6 08/18/2012 0415   RBC 4.08* 08/18/2012 0415   HGB 12.2* 08/18/2012 0415   HCT 36.8* 08/18/2012 0415   PLT 183 08/18/2012 0415   MCV 90.2 08/18/2012 0415   MCH 29.9 08/18/2012 0415   MCHC 33.2 08/18/2012 0415   RDW 14.8 08/18/2012 0415    BMET    Component Value Date/Time   NA 135 08/19/2012 0553   K 3.2* 08/19/2012 0553   CL 102 08/19/2012 0553   CO2 23 08/19/2012 0553   GLUCOSE 103* 08/19/2012 0553   BUN 19 08/19/2012 0553   CREATININE 1.29 08/19/2012 0553   CALCIUM 8.5 08/19/2012 0553   GFRNONAA 58* 08/19/2012 0553   GFRAA 67* 08/19/2012 0553    INR    Component Value Date/Time   INR 0.89 08/08/2012 1259     Intake/Output Summary (Last 24 hours) at 08/20/12 0733 Last data filed at 08/20/12 0500  Gross per 24 hour  Intake    362 ml  Output   1350 ml  Net   -988 ml     Assessment/Plan:  62 y.o. 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 Hart Rochester about future anticoagulation -DVT prophylaxis:  IV heparin -continue OOB and mobilize Home Rolling walker -instructed pt on keeping his groin dry to prevent moisture.  Doreatha Massed, PA-C [facility] [phone] 08/20/2012 7:33 AM  Agree with above assessment 2+ popliteal graft pulse palpable excellent Doppler flow in AT  Plan increase angulation today, DC heparin drip today, DC home in a.m.  ABI right AT-0.97

## 2012-08-21 MED ORDER — CLOPIDOGREL BISULFATE 75 MG PO TABS
75.0000 mg | ORAL_TABLET | Freq: Every day | ORAL | Status: DC
  Administered 2012-08-21: 75 mg via ORAL
  Filled 2012-08-21: qty 1

## 2012-08-21 NOTE — Care Management Note (Signed)
    Page 1 of 1   08/21/2012     4:11:32 PM   CARE MANAGEMENT NOTE 08/21/2012  Patient:  Paul Mullins   Account Number:  [id number]  Date Initiated:  08/21/2012  Documentation initiated by:  Austin Herd  Subjective/Objective Assessment:   PT ADM ON 08/17/12 S/P RT FEM POP BYPASS.  PTA, PT INDEPENDENT OF ADLS.     Action/Plan:   WILL FOLLOW FOR HOME NEEDS AS PT PROGRESSES.   Anticipated DC Date:  08/21/2012   Anticipated DC Plan:  HOME/SELF CARE      DC Planning Services  CM consult      Choice offered to / List presented to:     DME arranged  Levan Hurst      DME agency  [facility]        Status of service:  Completed, signed off Medicare Important Message given?   (If response is "NO", the following Medicare IM given date fields will be blank) Date Medicare IM given:   Date Additional Medicare IM given:    Discharge Disposition:  HOME/SELF CARE  Per UR Regulation:  Reviewed for med. necessity/level of care/duration of stay  If discussed at Long Length of Stay Meetings, dates discussed:    Comments:  08/21/12 Rosalita Chessman [phone] PT FOR DC HOME TODAY. NEEDS RW FOR HOME. REFERRAL TO [facility] FOR DME NEEDS.

## 2012-08-21 NOTE — Discharge Summary (Signed)
[facility] Discharge Summary  Paul Mullins [birth year] 62 y.o. 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 male was bring a friend to [facility] today and had sudden onset of right calf pain walking from the parking lot. He has has right calf pain with walking 25-50 feet that subsides with rest for over a year. He had right SFA stent placement at [facility] 2 weeks ago. He thinks his pain is getting worse and not better. He denies any rest pain or numbness in his right foot. He states the claudication symptoms have not improved since her recent intervention performed in [place]  Hospital Course:  The patient was admitted to the hospital and taken to the operating room on 08/17/2012 and underwent: BYPASS GRAFT FEMORAL-POPLITEAL ARTERY bypass using a nonreversible translocated saphenous vein graft from right leg with intraoperative arteriogram x2    The pt tolerated the procedure well and was transported to the PACU in good condition. He did have a slight bump in his creatinine on POD 1.  His fluids were discontinued the day of surgery and he was placed back on IVF for hydration.  He was also placed on heparin gtt .  He did have a palpable pulse on POD 1.  On  POD 3, his heparin was discontinued.  He was started back on his Plavix.  He is discharged on POD 4.  Of note, his Creatinine did improve with IV hydration.  Post operative ABI's are as follows:  RIGHT    LEFT     PRESSURE  WAVEFORM   PRESSURE  WAVEFORM   BRACHIAL   T  BRACHIAL  110  T   DP    DP     AT  107  DM  AT  137  DM   PT  52  Severe DM  PT  112  DM   PER    PER     GREAT TOE   NA  GREAT TOE   NA     RIGHT  LEFT   ABI  0.97  >1.0      The remainder of the hospital course consisted of increasing mobilization  and increasing intake of solids without difficulty.  CBC    Component Value Date/Time   WBC 5.6 08/18/2012 0415   RBC 4.08* 08/18/2012 0415   HGB 12.2* 08/18/2012 0415   HCT 36.8* 08/18/2012 0415   PLT 183 08/18/2012 0415   MCV 90.2 08/18/2012 0415   MCH 29.9 08/18/2012 0415   MCHC 33.2 08/18/2012 0415   RDW 14.8 08/18/2012 0415    BMET    Component Value Date/Time   NA 135 08/19/2012 0553   K 3.2* 08/19/2012 0553   CL 102 08/19/2012 0553   CO2 23 08/19/2012 0553   GLUCOSE 103* 08/19/2012 0553   BUN 19 08/19/2012 0553   CREATININE 1.29 08/19/2012 0553   CALCIUM 8.5 08/19/2012 0553   GFRNONAA 58* 08/19/2012 0553   GFRAA 67* 08/19/2012 0553     Discharge Instructions:   The patient is discharged to home with extensive instructions on wound care and progressive ambulation.  They are instructed not to drive or perform any heavy lifting until returning to see the physician in his office.  Discharge Orders   Future Appointments Provider Department Dept  Phone   09/04/2012 9:15 AM Pryor Ochoa, MD [facility] [phone]   Future Orders Complete By Expires     Call MD for:  redness, tenderness, or signs of infection (pain, swelling, bleeding, redness, odor or green/yellow discharge around incision site)  As directed     Call MD for:  severe or increased pain, loss or decreased feeling  in affected limb(s)  As directed     Call MD for:  temperature >100.5  As directed     Discharge wound care:  As directed     Comments:      Wash the groin wound with soap and water and pat dry.  Then put a dry gauze or washcloth there to keep this area dry.  Do not use Vaseline or neosporin on your incisions.  Only use soap and water on your incisions and then protect and keep dry.    Driving Restrictions  As directed     Comments:      No driving for 2 weeks    Lifting restrictions  As directed     Comments:      No heavy lifting for 6 weeks    Nursing communication  As directed     Scheduling  Instructions:      Please give paper Rx to patient at discharge.  It is in the paper chart.    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 PVD (peripheral vascular disease) 08/07/2012   Past Medical History  Diagnosis Date  . Hypertension   . Anginal pain   . Peripheral vascular disease   . Chronic systolic CHF (congestive heart failure)     [facility]; Dr. Lora Havens  . Coronary artery disease     08/08/12 - patient denied CAD, but is followed for CHF  . 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 Heart murmur     "since birth"; small membraneous VSD by 01/2012 echo       Medication List    TAKE these medications       amLODipine 10 MG tablet  Commonly known as:  NORVASC  Take 10 mg by mouth daily.     carvedilol 25 MG tablet  Commonly known as:  COREG  Take 25 mg by mouth 2 (two) times daily with a meal.     cloNIDine 0.1 MG tablet  Commonly known as:  CATAPRES  Take 0.1 mg by mouth 2 (two) times daily.     clopidogrel 75 MG tablet  Commonly known as:  PLAVIX  Take 75 mg by mouth daily.     hydrALAZINE 100 MG tablet  Commonly known as:  APRESOLINE  Take 100 mg by mouth 3 (three) times daily.     isosorbide mononitrate 60 MG 24 hr tablet  Commonly known as:  IMDUR  Take 90 mg by mouth daily.     nitroGLYCERIN 0.4 MG SL tablet  Commonly known as:  NITROSTAT  Place 0.4 mg under the tongue every 5 (five) minutes as needed for chest pain.     oxyCODONE 5 MG immediate release tablet  Commonly known as:  ROXICODONE  Take 1 tablet (5 mg total) by mouth every 6 (six) hours as needed for  pain.     pravastatin 40 MG tablet  Commonly known as:  PRAVACHOL  Take 40 mg by mouth daily.        Roxicodone #30 No  Refill  Disposition: home  Patient's condition: is Good  Follow up: 1. Dr. Hart Rochester in 2 weeks   Doreatha Massed, PA-C [facility] [phone] 08/21/2012  10:06 AM  - For VQI Registry use --- Instructions: Press F2 to tab through selections.  Delete question if not applicable.   Post-op:  Wound infection: No  Graft infection: No  Transfusion: No  If yes, n/a units given New Arrhythmia: No Ipsilateral amputation: [x ] no, [ ]  Minor, [ ]  BKA, [ ]  AKA Discharge patency: [x ] Primary, [ ]  Primary assisted, [ ]  Secondary, [ ]  Occluded Patency judged by: [ ]  Dopper only, [ ]  Palpable graft pulse, [x ] Palpable distal pulse, [x ] ABI inc. > 0.15, [ ]  Duplex Discharge ABI: R 0.97, L >1.0 Discharge TBI: R n/a, L n/a D/C Ambulatory Status: Ambulatory with Assistance  Complications: MI: [x ] No, [ ]  Troponin only, [ ]  EKG or Clinical CHF: No Resp failure: [x ] none, [ ]  Pneumonia, [ ]  Ventilator Chg in renal function: [ x] none, [ ]  Inc. Cr > 0.5, [ ]  Temp. Dialysis, [ ]  Permanent dialysis Stroke: [ x] None, [ ]  Minor, [ ]  Major Return to OR: No  Reason for return to OR: [ ]  Bleeding, [ ]  Infection, [ ]  Thrombosis, [ ]  Revision  Discharge medications: Statin use:  Yes ASA use:  No  for medical reason not prescribed Plavix use:  Yes Beta blocker use: Yes Coumadin use: No  for medical reason not indicated.

## 2012-08-21 NOTE — Progress Notes (Signed)
Discharge education given to pt and pt's family along with oxycodone prescription. Pt verbalized his understanding of discharge instructions. Pt is stable for discharge and received walker to take home. Called volunteers to take pt to car.

## 2012-09-03 ENCOUNTER — Encounter: Payer: Self-pay | Admitting: Vascular Surgery

## 2012-09-04 ENCOUNTER — Other Ambulatory Visit: Payer: Self-pay | Admitting: *Deleted

## 2012-09-04 ENCOUNTER — Ambulatory Visit (INDEPENDENT_AMBULATORY_CARE_PROVIDER_SITE_OTHER): Payer: Medicare Other | Admitting: Vascular Surgery

## 2012-09-04 ENCOUNTER — Encounter: Payer: Self-pay | Admitting: Vascular Surgery

## 2012-09-04 VITALS — BP 163/80 | HR 70 | Temp 97.1°F | Resp 16 | Ht 64.0 in | Wt 185.0 lb

## 2012-09-04 DIAGNOSIS — I739 Peripheral vascular disease, unspecified: Secondary | ICD-10-CM

## 2012-09-04 DIAGNOSIS — Z48812 Encounter for surgical aftercare following surgery on the circulatory system: Secondary | ICD-10-CM | POA: Insufficient documentation

## 2012-09-04 DIAGNOSIS — M79609 Pain in unspecified limb: Secondary | ICD-10-CM

## 2012-09-04 NOTE — Progress Notes (Signed)
Subjective:     Patient ID: Paul Mullins, male   DOB: 1950/08/05, 62 y.o.   MRN: [id number]  HPI this 62 year old male is now 18 days post right femoral-popliteal bypass with saphenous vein. He is known to have terrible tibial occlusive disease with only runoff being the anterior tibial artery which has diffuse disease particularly proximally. There was significant limitation of our options because of adequate saphenous vein available and were only able to get to the below-knee popliteal artery. He states that the numbness and pain in the right leg is much much better than it was preoperatively. His biggest complaint is swelling in the foot and calf.   Review of Systems     Objective:   Physical Exam BP 163/80  Pulse 70  Temp(Src) 97.1 F (36.2 C) (Oral)  Resp 16  Ht 5\' 4"  (1.626 m)  Wt 185 lb (83.915 kg)  BMI 31.74 kg/m2  SpO2 100%  Gen. male patient in no apparent distress alert and oriented x3 Right lower extremity with adequate healing of incisions some eschar in the inguinal wound but no infection 1-2+ edema right calf and foot. Excellent Doppler flow right anterior tibial artery     Assessment:     Successful patent right femoral popliteal saphenous vein graft with relief of rest pain and severe claudication Known  severe tibial occlusive disease with only runoff through diseased anterior tibial artery Postoperative edema    Plan:     Patient instructed to elevate foot of the bed 3 inches when he sleeps at night and also to intermittently elevate foot during day Will return in 2 months for duplex scan of vein graft an ABIs

## 2012-11-05 ENCOUNTER — Encounter: Payer: Self-pay | Admitting: Vascular Surgery

## 2012-11-06 ENCOUNTER — Ambulatory Visit: Payer: Medicare Other | Admitting: Vascular Surgery

## 2012-12-14 ENCOUNTER — Other Ambulatory Visit: Payer: Self-pay | Admitting: *Deleted

## 2012-12-14 DIAGNOSIS — I739 Peripheral vascular disease, unspecified: Secondary | ICD-10-CM

## 2012-12-14 DIAGNOSIS — Z48812 Encounter for surgical aftercare following surgery on the circulatory system: Secondary | ICD-10-CM

## 2012-12-24 ENCOUNTER — Encounter: Payer: Self-pay | Admitting: Vascular Surgery

## 2012-12-25 ENCOUNTER — Ambulatory Visit: Payer: Medicare Other | Admitting: Vascular Surgery

## 2013-01-15 ENCOUNTER — Other Ambulatory Visit: Payer: Self-pay | Admitting: Vascular Surgery

## 2013-01-15 DIAGNOSIS — I739 Peripheral vascular disease, unspecified: Secondary | ICD-10-CM

## 2013-02-04 ENCOUNTER — Encounter: Payer: Self-pay | Admitting: Vascular Surgery

## 2013-02-05 ENCOUNTER — Ambulatory Visit: Payer: Medicare Other | Admitting: Vascular Surgery

## 2013-02-05 ENCOUNTER — Inpatient Hospital Stay (HOSPITAL_COMMUNITY): Admission: RE | Admit: 2013-02-05 | Payer: Medicare Other | Source: Ambulatory Visit

## 2013-04-15 ENCOUNTER — Encounter: Payer: Self-pay | Admitting: Vascular Surgery

## 2013-04-16 ENCOUNTER — Encounter (HOSPITAL_COMMUNITY): Payer: Medicare Other

## 2013-04-16 ENCOUNTER — Inpatient Hospital Stay (HOSPITAL_COMMUNITY)
Admission: RE | Admit: 2013-04-16 | Discharge: 2013-04-16 | Disposition: A | Discharge status: Home / Self Care | Payer: Medicare Other | Source: Ambulatory Visit | Attending: Vascular Surgery | Admitting: Vascular Surgery

## 2013-04-16 ENCOUNTER — Ambulatory Visit: Payer: Medicare Other | Admitting: Vascular Surgery

## 2013-04-16 DIAGNOSIS — Z48812 Encounter for surgical aftercare following surgery on the circulatory system: Secondary | ICD-10-CM

## 2013-04-16 DIAGNOSIS — I739 Peripheral vascular disease, unspecified: Secondary | ICD-10-CM

## 2013-05-09 ENCOUNTER — Encounter: Payer: Self-pay | Admitting: Vascular Surgery

## 2013-05-10 ENCOUNTER — Other Ambulatory Visit (HOSPITAL_COMMUNITY): Payer: Medicare Other

## 2013-05-10 ENCOUNTER — Encounter (HOSPITAL_COMMUNITY): Payer: Medicare Other

## 2013-05-10 ENCOUNTER — Ambulatory Visit: Payer: Medicare Other | Admitting: Vascular Surgery

## 2013-05-30 ENCOUNTER — Encounter: Payer: Self-pay | Admitting: Vascular Surgery

## 2013-05-31 ENCOUNTER — Ambulatory Visit (INDEPENDENT_AMBULATORY_CARE_PROVIDER_SITE_OTHER)
Admission: RE | Admit: 2013-05-31 | Discharge: 2013-05-31 | Disposition: A | Discharge status: Home / Self Care | Payer: Medicare Other | Source: Ambulatory Visit | Attending: Vascular Surgery | Admitting: Vascular Surgery

## 2013-05-31 ENCOUNTER — Ambulatory Visit (HOSPITAL_COMMUNITY)
Admission: RE | Admit: 2013-05-31 | Discharge: 2013-05-31 | Disposition: A | Discharge status: Home / Self Care | Payer: Medicare Other | Source: Ambulatory Visit | Attending: Vascular Surgery | Admitting: Vascular Surgery

## 2013-05-31 ENCOUNTER — Encounter: Payer: Self-pay | Admitting: Vascular Surgery

## 2013-05-31 ENCOUNTER — Ambulatory Visit (INDEPENDENT_AMBULATORY_CARE_PROVIDER_SITE_OTHER): Payer: Medicare Other | Admitting: Vascular Surgery

## 2013-05-31 VITALS — BP 157/74 | HR 70 | Ht 64.0 in | Wt 192.3 lb

## 2013-05-31 DIAGNOSIS — F17200 Nicotine dependence, unspecified, uncomplicated: Secondary | ICD-10-CM | POA: Insufficient documentation

## 2013-05-31 DIAGNOSIS — Z9889 Other specified postprocedural states: Secondary | ICD-10-CM | POA: Insufficient documentation

## 2013-05-31 DIAGNOSIS — I70229 Atherosclerosis of native arteries of extremities with rest pain, unspecified extremity: Secondary | ICD-10-CM | POA: Insufficient documentation

## 2013-05-31 DIAGNOSIS — I739 Peripheral vascular disease, unspecified: Secondary | ICD-10-CM

## 2013-05-31 DIAGNOSIS — Z48812 Encounter for surgical aftercare following surgery on the circulatory system: Secondary | ICD-10-CM

## 2013-05-31 DIAGNOSIS — Z09 Encounter for follow-up examination after completed treatment for conditions other than malignant neoplasm: Secondary | ICD-10-CM | POA: Insufficient documentation

## 2013-05-31 DIAGNOSIS — I10 Essential (primary) hypertension: Secondary | ICD-10-CM | POA: Insufficient documentation

## 2013-05-31 NOTE — Progress Notes (Addendum)
    Established Previous Bypass  History of Present Illness  Paul Mullins is a 63 y.o. (March 03, 1951) male who presents with chief complaint: right leg swelling.  Previous operation(s) complete on 08/17/12 include: R CFA to BK pop bypass with NR GSV.  The patient's symptoms have improved.  The patient's symptoms are: right leg swelling. He no longer has rest pain.  The patient's treatment regimen currently included: maximal medical management.  The patient's PMH, PSH, SH, FamHx, Med, and Allergies are unchanged from 09/04/12.  On ROS today: no rest pain, no gangrene or ulcers  Physical Examination  Filed Vitals:   05/31/13 1428  BP: 157/74  Pulse: 70  Height: 5\' 4"  (1.626 m)  Weight: 192 lb 4.8 oz (87.227 kg)  SpO2: 100%   Body mass index is 32.99 kg/(m^2).  Physical Examination  Filed Vitals:   05/31/13 1428  BP: 157/74  Pulse: 70  Height: 5\' 4"  (1.626 m)  Weight: 192 lb 4.8 oz (87.227 kg)  SpO2: 100%   Body mass index is 32.99 kg/(m^2).  General: A&O x 3, WDWN  Pulmonary: Sym exp, good air movt, CTAB, no rales, rhonchi, & wheezing  Cardiac: RRR, Nl S1, S2, no Murmurs, rubs or gallops  Vascular: Vessel Right Left  Radial Palpable Palpable  Brachial Palpable Palpable  Carotid Palpable, without bruit Palpable, without bruit  Aorta Not palpable N/A  Femoral Palpable Palpable  Popliteal Not palpable Not palpable  PT Not Palpable Not Palpable  DP Faintly Palpable Not Palpable   Gastrointestinal: soft, NTND, -G/R, - HSM, - masses, - CVAT B  Musculoskeletal: M/S 5/5 throughout , Extremities without ischemic changes , incision well healed, 1-2+ edema RLE  Neurologic: Pain and light touch intact in extremities , Motor exam as listed above  Non-Invasive Vascular Imaging ABI (Date: 05/31/2013) R: 1.17 (0.97), DP: bi, PT: mono, TBI: 0.61 L: 1.05 (>1.0), DP: mono, PT: mono, TBI: 0.70  RLE Arterial Duplex (Date: 05/31/2013)  Difficult study due to ddepth of  graft  Increased velocities in distal graft (181-244 c/s)  Medical Decision Making  Paul Mullins is a 63 y.o. male who presents with:  s/p R Fem-pop bypass with NR GSV for CLI.   Unclear to me if pt has a hemodynamically significant stenosis distally vs. Tapering of conduit.    Would repeat the study in 3 months, if PSV reproduce, will defer to Dr. Kellie Simmering whether RLE runoff is needed.  Based on the patient's vascular studies and examination, I have offered the patient: BLE ABI, bypass duplex in 3 months. Pt will follow up with Dr. Kellie Simmering.  I discussed in depth with the patient the nature of atherosclerosis, and emphasized the importance of maximal medical management including strict control of blood pressure, blood glucose, and lipid levels, obtaining regular exercise, and cessation of smoking.  The patient is aware that without maximal medical management the underlying atherosclerotic disease process will progress, limiting the benefit of any interventions.  I discussed in depth with the patient the need for long term surveillance to improve the primary assisted patency of his bypass.  The patient agrees to cooperate with such. The patient is currently on a statin: Pravachol. The patient is currently on an anti-platelet: Plavix.  Thank you for allowing [place] to participate in this patient's care.  Adele Barthel, MD [facility] [place] Office: [phone] Pager: [phone]  05/31/2013, 2:50 PM

## 2013-06-06 ENCOUNTER — Other Ambulatory Visit: Payer: Self-pay | Admitting: *Deleted

## 2013-06-06 DIAGNOSIS — I739 Peripheral vascular disease, unspecified: Secondary | ICD-10-CM

## 2013-06-06 DIAGNOSIS — Z48812 Encounter for surgical aftercare following surgery on the circulatory system: Secondary | ICD-10-CM

## 2013-09-23 ENCOUNTER — Encounter: Payer: Self-pay | Admitting: Vascular Surgery

## 2013-09-24 ENCOUNTER — Ambulatory Visit: Payer: Medicare Other | Admitting: Vascular Surgery

## 2013-09-24 ENCOUNTER — Encounter (HOSPITAL_COMMUNITY): Payer: Medicare Other

## 2013-09-24 ENCOUNTER — Other Ambulatory Visit (HOSPITAL_COMMUNITY): Payer: Medicare Other

## 2013-10-28 ENCOUNTER — Encounter: Payer: Self-pay | Admitting: Vascular Surgery

## 2013-10-29 ENCOUNTER — Ambulatory Visit: Payer: Medicare Other | Admitting: Vascular Surgery

## 2013-10-29 ENCOUNTER — Encounter (HOSPITAL_COMMUNITY): Payer: Medicare Other

## 2013-10-29 ENCOUNTER — Other Ambulatory Visit (HOSPITAL_COMMUNITY): Payer: Medicare Other

## 2013-12-20 ENCOUNTER — Encounter: Payer: Self-pay | Admitting: Vascular Surgery

## 2013-12-24 ENCOUNTER — Other Ambulatory Visit (HOSPITAL_COMMUNITY): Payer: Medicare Other

## 2013-12-24 ENCOUNTER — Encounter (HOSPITAL_COMMUNITY): Payer: Medicare Other

## 2013-12-24 ENCOUNTER — Ambulatory Visit: Payer: Medicare Other | Admitting: Vascular Surgery

## 2014-02-03 ENCOUNTER — Encounter: Payer: Self-pay | Admitting: Vascular Surgery

## 2014-02-04 ENCOUNTER — Encounter (HOSPITAL_COMMUNITY): Payer: Medicare Other

## 2014-02-04 ENCOUNTER — Other Ambulatory Visit (HOSPITAL_COMMUNITY): Payer: Medicare Other

## 2014-02-04 ENCOUNTER — Ambulatory Visit: Payer: Medicare Other | Admitting: Vascular Surgery

## 2014-03-27 ENCOUNTER — Encounter (HOSPITAL_COMMUNITY): Payer: Self-pay | Admitting: Vascular Surgery

## 2014-06-03 ENCOUNTER — Ambulatory Visit: Payer: Medicare Other | Admitting: Family

## 2014-06-03 ENCOUNTER — Other Ambulatory Visit (HOSPITAL_COMMUNITY): Payer: Medicare Other

## 2015-10-11 ENCOUNTER — Emergency Department (HOSPITAL_COMMUNITY): Payer: Medicare HMO

## 2015-10-11 ENCOUNTER — Encounter (HOSPITAL_COMMUNITY): Payer: Self-pay

## 2015-10-11 ENCOUNTER — Inpatient Hospital Stay (HOSPITAL_COMMUNITY)
Admission: EM | Admit: 2015-10-11 | Discharge: 2015-10-20 | DRG: 054 | Disposition: A | Discharge status: Skilled Nursing Facility | Payer: Medicare HMO | Attending: Internal Medicine | Admitting: Internal Medicine

## 2015-10-11 ENCOUNTER — Other Ambulatory Visit: Payer: Self-pay

## 2015-10-11 DIAGNOSIS — Z87891 Personal history of nicotine dependence: Secondary | ICD-10-CM

## 2015-10-11 DIAGNOSIS — D631 Anemia in chronic kidney disease: Secondary | ICD-10-CM | POA: Diagnosis present

## 2015-10-11 DIAGNOSIS — Z7982 Long term (current) use of aspirin: Secondary | ICD-10-CM | POA: Diagnosis not present

## 2015-10-11 DIAGNOSIS — I160 Hypertensive urgency: Secondary | ICD-10-CM | POA: Diagnosis present

## 2015-10-11 DIAGNOSIS — E860 Dehydration: Secondary | ICD-10-CM | POA: Diagnosis present

## 2015-10-11 DIAGNOSIS — N183 Chronic kidney disease, stage 3 (moderate): Secondary | ICD-10-CM | POA: Diagnosis present

## 2015-10-11 DIAGNOSIS — D62 Acute posthemorrhagic anemia: Secondary | ICD-10-CM | POA: Diagnosis not present

## 2015-10-11 DIAGNOSIS — G131 Other systemic atrophy primarily affecting central nervous system in neoplastic disease: Secondary | ICD-10-CM | POA: Diagnosis present

## 2015-10-11 DIAGNOSIS — D496 Neoplasm of unspecified behavior of brain: Secondary | ICD-10-CM | POA: Diagnosis present

## 2015-10-11 DIAGNOSIS — I10 Essential (primary) hypertension: Secondary | ICD-10-CM | POA: Diagnosis not present

## 2015-10-11 DIAGNOSIS — G40111 Localization-related (focal) (partial) symptomatic epilepsy and epileptic syndromes with simple partial seizures, intractable, with status epilepticus: Secondary | ICD-10-CM | POA: Diagnosis not present

## 2015-10-11 DIAGNOSIS — E785 Hyperlipidemia, unspecified: Secondary | ICD-10-CM | POA: Diagnosis present

## 2015-10-11 DIAGNOSIS — Z888 Allergy status to other drugs, medicaments and biological substances status: Secondary | ICD-10-CM | POA: Diagnosis not present

## 2015-10-11 DIAGNOSIS — G9340 Encephalopathy, unspecified: Secondary | ICD-10-CM | POA: Diagnosis not present

## 2015-10-11 DIAGNOSIS — I700 Atherosclerosis of aorta: Secondary | ICD-10-CM | POA: Diagnosis present

## 2015-10-11 DIAGNOSIS — G9349 Other encephalopathy: Secondary | ICD-10-CM | POA: Diagnosis present

## 2015-10-11 DIAGNOSIS — N179 Acute kidney failure, unspecified: Secondary | ICD-10-CM | POA: Diagnosis present

## 2015-10-11 DIAGNOSIS — I130 Hypertensive heart and chronic kidney disease with heart failure and stage 1 through stage 4 chronic kidney disease, or unspecified chronic kidney disease: Secondary | ICD-10-CM | POA: Diagnosis present

## 2015-10-11 DIAGNOSIS — I2510 Atherosclerotic heart disease of native coronary artery without angina pectoris: Secondary | ICD-10-CM | POA: Insufficient documentation

## 2015-10-11 DIAGNOSIS — G40901 Epilepsy, unspecified, not intractable, with status epilepticus: Secondary | ICD-10-CM

## 2015-10-11 DIAGNOSIS — I5022 Chronic systolic (congestive) heart failure: Secondary | ICD-10-CM | POA: Diagnosis not present

## 2015-10-11 DIAGNOSIS — N189 Chronic kidney disease, unspecified: Secondary | ICD-10-CM

## 2015-10-11 DIAGNOSIS — Z7902 Long term (current) use of antithrombotics/antiplatelets: Secondary | ICD-10-CM | POA: Diagnosis not present

## 2015-10-11 DIAGNOSIS — Q210 Ventricular septal defect: Secondary | ICD-10-CM

## 2015-10-11 DIAGNOSIS — G40101 Localization-related (focal) (partial) symptomatic epilepsy and epileptic syndromes with simple partial seizures, not intractable, with status epilepticus: Secondary | ICD-10-CM | POA: Diagnosis present

## 2015-10-11 DIAGNOSIS — I5032 Chronic diastolic (congestive) heart failure: Secondary | ICD-10-CM | POA: Diagnosis present

## 2015-10-11 DIAGNOSIS — G40909 Epilepsy, unspecified, not intractable, without status epilepticus: Secondary | ICD-10-CM | POA: Insufficient documentation

## 2015-10-11 DIAGNOSIS — Z79899 Other long term (current) drug therapy: Secondary | ICD-10-CM

## 2015-10-11 DIAGNOSIS — R569 Unspecified convulsions: Secondary | ICD-10-CM | POA: Diagnosis present

## 2015-10-11 DIAGNOSIS — G939 Disorder of brain, unspecified: Secondary | ICD-10-CM

## 2015-10-11 DIAGNOSIS — I739 Peripheral vascular disease, unspecified: Secondary | ICD-10-CM | POA: Diagnosis present

## 2015-10-11 DIAGNOSIS — D499 Neoplasm of unspecified behavior of unspecified site: Secondary | ICD-10-CM | POA: Insufficient documentation

## 2015-10-11 LAB — COMPREHENSIVE METABOLIC PANEL
ALBUMIN: 3.4 g/dL — AB (ref 3.5–5.0)
ALT: 22 U/L (ref 17–63)
ANION GAP: 9 (ref 5–15)
AST: 36 U/L (ref 15–41)
Alkaline Phosphatase: 56 U/L (ref 38–126)
BILIRUBIN TOTAL: 1.1 mg/dL (ref 0.3–1.2)
BUN: 20 mg/dL (ref 6–20)
CO2: 24 mmol/L (ref 22–32)
Calcium: 9 mg/dL (ref 8.9–10.3)
Chloride: 105 mmol/L (ref 101–111)
Creatinine, Ser: 2 mg/dL — ABNORMAL HIGH (ref 0.61–1.24)
GFR calc Af Amer: 39 mL/min — ABNORMAL LOW (ref >60)
GFR calc non Af Amer: 33 mL/min — ABNORMAL LOW (ref >60)
GLUCOSE: 96 mg/dL (ref 65–99)
POTASSIUM: 4.5 mmol/L (ref 3.5–5.1)
SODIUM: 138 mmol/L (ref 135–145)
TOTAL PROTEIN: 7.8 g/dL (ref 6.5–8.1)

## 2015-10-11 LAB — CBC WITH DIFFERENTIAL/PLATELET
BASOS PCT: 0 %
Basophils Absolute: 0 10*3/uL (ref 0.0–0.1)
EOS ABS: 1.1 10*3/uL — AB (ref 0.0–0.7)
Eosinophils Relative: 19 %
HEMATOCRIT: 39.1 % (ref 39.0–52.0)
HEMOGLOBIN: 12.8 g/dL — AB (ref 13.0–17.0)
LYMPHS ABS: 1.5 10*3/uL (ref 0.7–4.0)
Lymphocytes Relative: 26 %
MCH: 29.6 pg (ref 26.0–34.0)
MCHC: 32.7 g/dL (ref 30.0–36.0)
MCV: 90.3 fL (ref 78.0–100.0)
MONOS PCT: 8 %
Monocytes Absolute: 0.5 10*3/uL (ref 0.1–1.0)
NEUTROS ABS: 2.6 10*3/uL (ref 1.7–7.7)
NEUTROS PCT: 47 %
Platelets: 222 10*3/uL (ref 150–400)
RBC: 4.33 MIL/uL (ref 4.22–5.81)
RDW: 13.5 % (ref 11.5–15.5)
WBC: 5.6 10*3/uL (ref 4.0–10.5)

## 2015-10-11 LAB — CREATININE, SERUM
Creatinine, Ser: 2.05 mg/dL — ABNORMAL HIGH (ref 0.61–1.24)
GFR calc Af Amer: 37 mL/min — ABNORMAL LOW (ref >60)
GFR calc non Af Amer: 32 mL/min — ABNORMAL LOW (ref >60)

## 2015-10-11 LAB — CBC
HEMATOCRIT: 39.2 % (ref 39.0–52.0)
Hemoglobin: 12.8 g/dL — ABNORMAL LOW (ref 13.0–17.0)
MCH: 29.7 pg (ref 26.0–34.0)
MCHC: 32.7 g/dL (ref 30.0–36.0)
MCV: 91 fL (ref 78.0–100.0)
Platelets: 219 10*3/uL (ref 150–400)
RBC: 4.31 MIL/uL (ref 4.22–5.81)
RDW: 13.4 % (ref 11.5–15.5)
WBC: 5 10*3/uL (ref 4.0–10.5)

## 2015-10-11 MED ORDER — LORAZEPAM 2 MG/ML IJ SOLN: 1.0000 mg | INTRAMUSCULAR | Status: DC | PRN

## 2015-10-11 MED ORDER — SODIUM CHLORIDE 0.9 % IV SOLN
1000.0000 mg | Freq: Once | INTRAVENOUS | Status: DC
  Filled 2015-10-11: qty 10

## 2015-10-11 MED ORDER — ATORVASTATIN CALCIUM 40 MG PO TABS
40.0000 mg | ORAL_TABLET | Freq: Every day | ORAL | Status: DC
  Administered 2015-10-11 – 2015-10-19 (×9): 40 mg via ORAL
  Filled 2015-10-11 (×9): qty 1

## 2015-10-11 MED ORDER — SODIUM CHLORIDE 0.9 % IV SOLN
1500.0000 mg | Freq: Two times a day (BID) | INTRAVENOUS | Status: DC
  Filled 2015-10-11: qty 15

## 2015-10-11 MED ORDER — CLONIDINE HCL 0.2 MG PO TABS
0.2000 mg | ORAL_TABLET | Freq: Two times a day (BID) | ORAL | Status: DC
  Administered 2015-10-11 – 2015-10-20 (×18): 0.2 mg via ORAL
  Filled 2015-10-11 (×18): qty 1

## 2015-10-11 MED ORDER — HYDRALAZINE HCL 20 MG/ML IJ SOLN: 10.0000 mg | Freq: Four times a day (QID) | INTRAMUSCULAR | Status: DC | PRN

## 2015-10-11 MED ORDER — LORAZEPAM 2 MG/ML IJ SOLN
1.0000 mg | INTRAMUSCULAR | Status: DC | PRN
  Administered 2015-10-11: 1 mg via INTRAVENOUS
  Filled 2015-10-11: qty 1

## 2015-10-11 MED ORDER — SODIUM CHLORIDE 0.9 % IV SOLN
75.0000 mL/h | INTRAVENOUS | Status: DC
  Administered 2015-10-11 – 2015-10-13 (×3): 75 mL/h via INTRAVENOUS

## 2015-10-11 MED ORDER — ASPIRIN EC 81 MG PO TBEC
81.0000 mg | DELAYED_RELEASE_TABLET | Freq: Every day | ORAL | Status: DC
  Administered 2015-10-11 – 2015-10-20 (×10): 81 mg via ORAL
  Filled 2015-10-11 (×10): qty 1

## 2015-10-11 MED ORDER — DIPHENHYDRAMINE HCL 50 MG/ML IJ SOLN
25.0000 mg | Freq: Once | INTRAMUSCULAR | Status: AC
  Administered 2015-10-11: 25 mg via INTRAVENOUS

## 2015-10-11 MED ORDER — ISOSORBIDE MONONITRATE ER 60 MG PO TB24
90.0000 mg | ORAL_TABLET | Freq: Every day | ORAL | Status: DC
  Administered 2015-10-11 – 2015-10-20 (×10): 90 mg via ORAL
  Filled 2015-10-11 (×10): qty 1

## 2015-10-11 MED ORDER — GABAPENTIN 300 MG PO CAPS
300.0000 mg | ORAL_CAPSULE | Freq: Three times a day (TID) | ORAL | Status: DC
  Administered 2015-10-11 – 2015-10-20 (×26): 300 mg via ORAL
  Filled 2015-10-11 (×26): qty 1

## 2015-10-11 MED ORDER — HEPARIN SODIUM (PORCINE) 5000 UNIT/ML IJ SOLN
5000.0000 [IU] | Freq: Two times a day (BID) | INTRAMUSCULAR | Status: DC
  Administered 2015-10-11 – 2015-10-20 (×18): 5000 [IU] via SUBCUTANEOUS
  Filled 2015-10-11 (×18): qty 1

## 2015-10-11 MED ORDER — DIPHENHYDRAMINE HCL 50 MG/ML IJ SOLN
INTRAMUSCULAR | Status: AC
  Filled 2015-10-11: qty 1

## 2015-10-11 MED ORDER — SODIUM CHLORIDE 0.9 % IV SOLN
1500.0000 mg | Freq: Two times a day (BID) | INTRAVENOUS | Status: DC
  Administered 2015-10-11 – 2015-10-14 (×6): 1500 mg via INTRAVENOUS
  Filled 2015-10-11 (×7): qty 15

## 2015-10-11 MED ORDER — CLOPIDOGREL BISULFATE 75 MG PO TABS
75.0000 mg | ORAL_TABLET | Freq: Every day | ORAL | Status: DC
  Administered 2015-10-11 – 2015-10-20 (×10): 75 mg via ORAL
  Filled 2015-10-11 (×10): qty 1

## 2015-10-11 MED ORDER — AMLODIPINE BESYLATE 10 MG PO TABS
10.0000 mg | ORAL_TABLET | Freq: Every day | ORAL | Status: DC
  Administered 2015-10-11 – 2015-10-20 (×10): 10 mg via ORAL
  Filled 2015-10-11 (×10): qty 1

## 2015-10-11 MED ORDER — ONDANSETRON HCL 4 MG/2ML IJ SOLN
4.0000 mg | Freq: Three times a day (TID) | INTRAMUSCULAR | Status: AC | PRN
Start: 2015-10-11 — End: 2015-10-12

## 2015-10-11 MED ORDER — GADOBENATE DIMEGLUMINE 529 MG/ML IV SOLN
6.0000 mL | Freq: Once | INTRAVENOUS | Status: AC | PRN
  Administered 2015-10-11: 6 mL via INTRAVENOUS

## 2015-10-11 MED ORDER — METOPROLOL TARTRATE 5 MG/5ML IV SOLN
5.0000 mg | Freq: Once | INTRAVENOUS | Status: AC
  Administered 2015-10-11: 5 mg via INTRAVENOUS
  Filled 2015-10-11: qty 5

## 2015-10-11 MED ORDER — NITROGLYCERIN 0.4 MG SL SUBL: 0.4000 mg | SUBLINGUAL_TABLET | SUBLINGUAL | Status: DC | PRN

## 2015-10-11 MED ORDER — SODIUM CHLORIDE 0.9 % IV SOLN
200.0000 mg | Freq: Once | INTRAVENOUS | Status: AC
  Administered 2015-10-11: 200 mg via INTRAVENOUS
  Filled 2015-10-11: qty 20

## 2015-10-11 MED ORDER — FOSPHENYTOIN SODIUM 500 MG PE/10ML IJ SOLN
20.0000 mg/kg | Freq: Once | INTRAMUSCULAR | Status: AC
  Administered 2015-10-11: 1222 mg via INTRAVENOUS
  Filled 2015-10-11: qty 24.44

## 2015-10-11 MED ORDER — LABETALOL HCL 200 MG PO TABS
200.0000 mg | ORAL_TABLET | Freq: Two times a day (BID) | ORAL | Status: DC
  Administered 2015-10-11 – 2015-10-20 (×18): 200 mg via ORAL
  Filled 2015-10-11 (×18): qty 1

## 2015-10-11 MED ORDER — SODIUM CHLORIDE 0.9 % IV SOLN
200.0000 mg | Freq: Two times a day (BID) | INTRAVENOUS | Status: DC
  Administered 2015-10-12 – 2015-10-19 (×15): 200 mg via INTRAVENOUS
  Filled 2015-10-11 (×23): qty 20

## 2015-10-11 MED ORDER — PANTOPRAZOLE SODIUM 40 MG PO TBEC
40.0000 mg | DELAYED_RELEASE_TABLET | Freq: Every day | ORAL | Status: DC
  Administered 2015-10-11 – 2015-10-20 (×10): 40 mg via ORAL
  Filled 2015-10-11 (×11): qty 1

## 2015-10-11 NOTE — Progress Notes (Signed)
Patient arrived in the unit accompanied by RN via stretcher. Orientation to the unit given. Patient  V.erbalizes understanding

## 2015-10-11 NOTE — ED Notes (Signed)
Report received from Maryfrances Bunnell, RN  Pt remains in MRI at this time

## 2015-10-11 NOTE — H&P (Signed)
[facility] History and Physical  Paul Mullins [id number] DOB: 1951-01-24 DOA: 10/11/2015  Referring physician: ED PCP: Paul Patten, MD   Chief Complaint: right leg twitching  HPI: Paul Mullins is a 65 y.o. male  with history of seizures, as well as hypertension, cad, ventral septal defect, chronic systolic chf,  Likely ckd per records, prior history of severe claudication and rest ischemia status post right femoral popliteal bypass in 2014, was discharged yesterday from [facility] secondary to weakness and confusion.  He complains of distal right leg shaking with increase weakness w/ fall a few days ago for which he was seen at [facility].  They sutured his head laceration, then discharged him to home yesterday.  He went home with his leg still "twitching".  He comes to our ED for further evaluation of the Leg twitching.  Patient used to be on Keppra but he ran out of his medicine a few days ago, and several other medications he could not name.  He states he is hungry. Denies sp/sob/doe.  On arrival to Ed, he is hypertensive,  200/83, denies headache.  Review of Systems:  Per HPI, o/w all systems reviewed and negative.  Past Medical History  Diagnosis Date  . Hypertension   . Anginal pain ([place])   . Peripheral vascular disease ([place])   . Chronic systolic CHF (congestive heart failure) [facility])     [facility]; Dr. Rich Reining  . Coronary artery disease     08/08/12 - patient denied CAD, but is followed for CHF  . 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 Heart murmur     "since birth"; small membraneous VSD by 01/2012 echo   Past Surgical History  Procedure Laterality Date  . Neck surgery    . Neck surgery    . Stones    . Gallstones    . Femoral-popliteal bypass graft Right 08/17/2012    Procedure: BYPASS GRAFT FEMORAL-POPLITEAL ARTERY;  Surgeon: Mal Misty, MD;  Location: [facility];  Service: Vascular;  Laterality: Right;  using non reversed Sapphenous vein with intraoperative arteriogram.  . Abdominal aortagram N/A 07/05/2012    Procedure: ABDOMINAL Maxcine Ham;  Surgeon: Conrad Thomson, MD;  Location: [facility] CATH LAB;  Service: Cardiovascular;  Laterality: N/A;   Social History:  reports that he quit smoking about 3 years ago. His smoking use included Cigarettes. He has never used smokeless tobacco. He reports that he does not drink alcohol or use illicit drugs.  Allergies  Allergen Reactions  . Ace Inhibitors Other (See Comments)    Impaired  Kidney Function    Family hx: reviewed, noncontributory.  Prior to Admission medications   Medication Sig Start Date End Date Taking? Authorizing Provider  amLODipine (NORVASC) 10 MG tablet Take 10 mg by mouth daily.   Yes Historical Provider, MD  aspirin EC 81 MG tablet Take 81 mg by mouth daily.   Yes Historical Provider, MD  atorvastatin (LIPITOR) 40 MG tablet Take 40 mg by mouth daily.   Yes Historical Provider, MD  cloNIDine (CATAPRES) 0.2 MG tablet Take 0.2 mg by mouth 2 (two) times daily.  05/22/13  Yes Historical Provider, MD  clopidogrel (PLAVIX) 75 MG tablet Take 75 mg by mouth daily.   Yes Historical Provider, MD  gabapentin (NEURONTIN) 300 MG capsule Take 300 mg by mouth 3 (three) times daily.   Yes Historical Provider, MD  isosorbide  mononitrate (IMDUR) 30 MG 24 hr tablet Take 90 mg by mouth daily.   Yes Historical Provider, MD  labetalol (NORMODYNE) 200 MG tablet Take 200 mg by mouth 2 (two) times daily.   Yes Historical Provider, MD  levETIRAcetam (KEPPRA) 250 MG tablet Take 500 mg by mouth 2 (two) times daily.   Yes Historical Provider, MD  nitroGLYCERIN (NITROSTAT) 0.4 MG SL tablet Place 0.4 mg under the tongue every 5 (five) minutes as needed for chest pain.   Yes Historical Provider, MD  pantoprazole (PROTONIX) 20 MG tablet Take 40 mg by mouth daily.   Yes Historical Provider, MD    Physical Exam: Filed Vitals:   10/11/15 1607 10/11/15 1608 10/11/15 1617 10/11/15 1647  BP: 181/85  156/86   Pulse:  85 78   Temp:    97.5 F (36.4 C)  Resp: 11 19 15    Height:      Weight:      SpO2:  98% 98%     Wt Readings from Last 3 Encounters:  10/11/15 61.1 kg (134 lb 11.2 oz)  05/31/13 87.227 kg (192 lb 4.8 oz)  09/04/12 83.915 kg (185 lb)    General:  Appears calm and comfortable, NAD, AAOx3, disheveled, able to give full hx. Eyes: PERRL, normal lids, irises & conjunctiva ENT: grossly normal hearing, lips & tongue, dry MM Neck: no LAD,  Cardiovascular: RRR, no m/r/g. No LE edema. Telemetry: SR, no arrhythmias  Respiratory: CTA bilaterally, no w/r/r. Normal respiratory effort. Abdomen: soft, ntnd Skin: no rash or induration seen on limited exam Musculoskeletal: grossly normal tone BUE/BLE Psychiatric: grossly normal mood and affect, speech fluent and appropriate Neurologic: grossly non-focal.  + rle continuous clonic activity throughout the entire le.            Labs on Admission:  Basic Metabolic Panel:  Recent Labs Lab 10/11/15 1127  NA 138  K 4.5  CL 105  CO2 24  GLUCOSE 96  BUN 20  CREATININE 2.00*  CALCIUM 9.0   Liver Function Tests:  Recent Labs Lab 10/11/15 1127  AST 36  ALT 22  ALKPHOS 56  BILITOT 1.1  PROT 7.8  ALBUMIN 3.4*   No results for input(s): LIPASE, AMYLASE in the last 168 hours. No results for input(s): AMMONIA in the last 168 hours. CBC:  Recent Labs Lab 10/11/15 1127  WBC 5.6  NEUTROABS 2.6  HGB 12.8*  HCT 39.1  MCV 90.3  PLT 222   Cardiac Enzymes: No results for input(s): CKTOTAL, CKMB, CKMBINDEX, TROPONINI in the last 168 hours.  BNP (last 3 results) No results for input(s): BNP in the last 8760 hours.  ProBNP (last 3 results) No results for input(s): PROBNP in the last 8760 hours.  CBG: No results for input(s): GLUCAP in the last 168 hours.  Radiological Exams on Admission: Mr Kizzie Fantasia  Contrast  10/11/2015  CLINICAL DATA:  Seizures. EXAM: MRI HEAD WITHOUT AND WITH CONTRAST TECHNIQUE: Multiplanar, multiecho pulse sequences of the brain and surrounding structures were obtained without and with intravenous contrast. CONTRAST:  87mL MULTIHANCE GADOBENATE DIMEGLUMINE 529 MG/ML IV SOLN COMPARISON:  Head CT 10/07/2015 and MRI 08/19/2015 FINDINGS: Confluent region of T2 hyperintensity/edema in the left parietal lobe and posterior cingulate gyrus has mildly progressed from the prior MRI, again with mild mass effect with sulcal effacement. Diffusion is facilitated, and this extends over approximately 7 mm. There is new left parietal cortical trace diffusion signal abnormality at the margins of this region with relatively  normal ADC. Small foci of susceptibility artifact within this region are unchanged and compatible with remote hemorrhages. There is some mildly prominent leptomeningeal vascular enhancement in this region as well as some non masslike petechial parenchymal enhancement. Elsewhere, there is no evidence of acute infarct. No extra-axial fluid collection or midline shift. Slight mass effect on the left lateral ventricle from the left parietal abnormality, with the ventricles otherwise normal in size. Scattered, small foci of T2 hyperintensity elsewhere in the cerebral white matter bilaterally are unchanged and nonspecific but compatible with mild chronic small vessel ischemic disease. No abnormal enhancement is identified elsewhere. Orbits are unremarkable. Mild mucosal thickening is noted in the paranasal sinuses, and there is a small chronic right mastoid effusion. Major intracranial vascular flow voids are unchanged. IMPRESSION: 1. Mild further enlargement of the region of left parietal signal abnormality with small amount of petechial enhancement. Given the mild progression from last month's MRI as well as an appearance which is largely that of white matter edema, this is most concerning for  neoplasm. Subacute infarct would be expected to have shown noticeable improvement in this timeframe, even if there had been some additional ischemia in the interim. Cerebritis could also give this appearance though does not fit the clinical presentation. 2. Mild left parietal cortical diffusion abnormality likely reflects recent seizure activity. Electronically Signed   By: Logan Bores M.D.   On: 10/11/2015 16:35   Dg Chest Portable 1 View  10/11/2015  CLINICAL DATA:  Seizure like activity, mid sternal chest pain, recent slightly productive cough for 1 week, history hypertension, CHF, coronary artery disease, VSD EXAM: PORTABLE CHEST 1 VIEW COMPARISON:  Portable exam 0033 hours compared to 08/19/2015 FINDINGS: Normal heart size, mediastinal contours, and pulmonary vascularity. Lungs clear. No pleural effusion or pneumothorax. Bones unremarkable. IMPRESSION: No acute abnormalities. Electronically Signed   By: Lavonia Dana M.D.   On: 10/11/2015 12:57    EKG: Independently reviewed. SR, PACs, rate 75, consider lvh.    Assessment/Plan Principal Problem:   Hypertensive urgency Active Problems:   Status epilepticus (HCC)   AKI (acute kidney injury) ([place])   CKD (chronic kidney disease)   1. htn urgency -  - admit tele, currently w/o chest pain/ha. - per hx, pt ran of of his meds recently and went to outside er for refills. , concern for medication noncompliance - hydralazine iv prn - resume home rx: clonidine, labetalol, imdur, norvasc  2. rle clonic activity, felt due to partial status epilepticus per neuro. - defer to Neuro, Dr Leonel Ramsay, eeg planned. - currently on cerebyx - ativan prn iv for sz - seizure precautions - MRI head:  noted w/ left parietal cortical diffusion abnormality likely reflects recent sz activity.  3. MRI w/ concern for neoplasm - mri:  left parietal signal abnormality with small amount of petechial enhancement. Given the mild progression from last month's MRI as  well as an appearance which is largely that of white matter edema, this is most concerning for neoplasm.  - defer to am team, consider nsg eval.  4. Cad  - continue asa /statin/bb.  5. Aki, w/ likely ckd 3 - gentle ivf hydration, pt appears dry.   6. Hx of pvd,  - s/p post right femoral popliteal bypass in 2014  7. Hx of chf, systolic, however, pt appears dehydrated - continue ivf   dw pt poc dw DR Leonel Ramsay, neuro, appreciate assistance.  Code Status: full DVT Prophylaxis: hep  bid (given renal function) Family Communication: patient  Disposition Plan: inpatient tele  Time spent: 23mins.  Maren Reamer MD., MBA/MHA [facility] Pager [phone]

## 2015-10-11 NOTE — Progress Notes (Signed)
No response to cerebyx. He has severe pruritis, unclear in timing in relation to cerebyx, but will hold off for now.   1) Keppra 1500mg  BID 2) will load with vimpat 200mg , then 200mg  BID 3) EEG in the AM.  Roland Rack, MD [facility] [phone]  If 7pm- 7am, please page neurology on call as listed in [place].

## 2015-10-11 NOTE — ED Notes (Addendum)
Patient has multiple complaints and right leg pain. States he cannot control his leg. States that he wasn't recently admitted to regional hospital. Has sutures to forehead and dried blood all over shirt. Repetitive questions and statements. Attempted to assist to stand to urinate and patient will not stand on right leg.

## 2015-10-11 NOTE — ED Notes (Signed)
Patient transported to MRI 

## 2015-10-11 NOTE — ED Notes (Signed)
Pt states leg calmed down after meds.

## 2015-10-11 NOTE — Consult Note (Addendum)
Neurology Consultation Reason for Consult: Right leg jerking Referring Physician: Audie Pinto, R  CC: Right leg jerking  History is obtained from: Patient  HPI: Paul Mullins is a 65 y.o. male with a history of seizures, though he is not aware of this, but has been taking Keppra but ran out of it several days before his initial presentation [place] [facility]. He then began having shaking of his right leg and presented to [facility]. He is apparently discharged then went home and fell. He returned and was admitted. His family state that his right leg has been jerking nonstop since his initial presentation on 6/21.  Of note, though the patient states that he went [place] [facility] initially for the right leg shaking, there is no note of this in his ED visit and it reports that he was therefore medication refills.   He then returned later because he fell. There was apparently some altered mental status in the ER and he was given an oral dose of Ativan.  There is again no note of right leg jerking H&P or ED provider notes.  He is unable to have likely delirium secondary to concussion. During his admission, he had an EEG which was negative for seizure.  Per family, the jerking of the right leg continues even when the patient is asleep. Also, he intermittently becomes unresponsive and has some jerking of his head and neck as well while he is unresponsive.  ROS: A 14 point ROS was performed and is negative except as noted in the HPI.   Past Medical History  Diagnosis Date  . Hypertension   . Anginal pain ([place])   . Peripheral vascular disease ([place])   . Chronic systolic CHF (congestive heart failure) [facility])     [facility]; Dr. Rich Reining  . Coronary artery disease     08/08/12 - patient denied CAD, but is followed for CHF  . 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 Heart murmur     "since birth";  small membraneous VSD by 01/2012 echo     No family history on file.   Social History:  reports that he quit smoking about 3 years ago. His smoking use included Cigarettes. He has never used smokeless tobacco. He reports that he does not drink alcohol or use illicit drugs.   Exam: Current vital signs: BP 200/83 mmHg  Pulse 98  Temp(Src) 97.5 F (36.4 C)  Resp 27  SpO2 100% Vital signs in last 24 hours: Temp:  [97.5 F (36.4 C)] 97.5 F (36.4 C) (06/25 1102) Pulse Rate:  [98-101] 98 (06/25 1155) Resp:  [22-27] 27 (06/25 1155) BP: (200-215)/(83-127) 200/83 mmHg (06/25 1155) SpO2:  [96 %-100 %] 100 % (06/25 1155)   Physical Exam  Constitutional: Appears well-developed and well-nourished.  Psych: Affect appropriate to situation Eyes: No scleral injection HENT: No OP obstrucion Head: Normocephalic.  Cardiovascular: Normal rate and regular rhythm.  Respiratory: Effort normal and breath sounds normal to anterior ascultation GI: Soft.  No distension. There is no tenderness.  Skin: WDI  Neuro: Mental Status: Patient is awake, alert, oriented to person, place, month, year, and situation. Patient is able to give a clear and coherent history. No signs of aphasia or neglect Cranial Nerves: II: Visual Fields are full. Pupils are equal, round, and reactive to light.   III,IV, VI: EOMI without ptosis or diploplia.  V: Facial sensation is symmetric to  temperature VII: Facial movement is symmetric.  VIII: hearing is intact to voice X: Uvula elevates symmetrically XI: Shoulder shrug is symmetric. XII: tongue is midline without atrophy or fasciculations.  Motor: Tone is normal. Bulk is normal. 5/5 strength was present on the left, he has some difficulty with control in the right arm but is able to use it with at least 4/5 strength. There is continuous clonic activity throughout the right leg. Sensory: Reports reduced sensation over the right compared to left leg.   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 These types of partial seizures can be extremely difficult to treat. It sounds like he is having intermittent spread of the seizure causing intermittent partial complex seizures on top of the epilepsia partialis continua. He will need more aggressive seizure treatment, but I would not favor pursuing overly aggressive treatment at this time (e.g. burst suppression) but would favor giving conservative treatments with more typical antiepileptics first.  I suspect that running out of his seizure medicines is what precipitated this event.  Of note, though he did have a negative EEG while at [place] simple partial seizures can be an perceptible at a surface EEG level.  Recommendations: 1) load with Cerebyx 20 PE per kilogram 2) continue Keppra 500 mg twice a day in any case, likely will load with additional depending on response to Cerebyx 3) for now I will treat clinically, but he will need an EEG tomorrow to assist with guiding further therapy. 4) neurology will follow   Roland Rack, MD [facility] [phone]  If 7pm- 7am, please page neurology on call as listed in [place].

## 2015-10-12 DIAGNOSIS — I10 Essential (primary) hypertension: Secondary | ICD-10-CM

## 2015-10-12 DIAGNOSIS — N183 Chronic kidney disease, stage 3 (moderate): Secondary | ICD-10-CM

## 2015-10-12 DIAGNOSIS — G9340 Encephalopathy, unspecified: Secondary | ICD-10-CM

## 2015-10-12 LAB — BASIC METABOLIC PANEL
Anion gap: 8 (ref 5–15)
BUN: 28 mg/dL — AB (ref 6–20)
CO2: 21 mmol/L — ABNORMAL LOW (ref 22–32)
CREATININE: 2.13 mg/dL — AB (ref 0.61–1.24)
Calcium: 7.9 mg/dL — ABNORMAL LOW (ref 8.9–10.3)
Chloride: 110 mmol/L (ref 101–111)
GFR calc Af Amer: 36 mL/min — ABNORMAL LOW (ref >60)
GFR, EST NON AFRICAN AMERICAN: 31 mL/min — AB (ref >60)
Glucose, Bld: 106 mg/dL — ABNORMAL HIGH (ref 65–99)
Potassium: 3.9 mmol/L (ref 3.5–5.1)
SODIUM: 139 mmol/L (ref 135–145)

## 2015-10-12 LAB — CBC WITH DIFFERENTIAL/PLATELET
Basophils Absolute: 0 10*3/uL (ref 0.0–0.1)
Basophils Relative: 0 %
EOS ABS: 1.1 10*3/uL — AB (ref 0.0–0.7)
EOS PCT: 23 %
HCT: 34.9 % — ABNORMAL LOW (ref 39.0–52.0)
Hemoglobin: 11 g/dL — ABNORMAL LOW (ref 13.0–17.0)
LYMPHS ABS: 1.7 10*3/uL (ref 0.7–4.0)
Lymphocytes Relative: 35 %
MCH: 29.1 pg (ref 26.0–34.0)
MCHC: 31.5 g/dL (ref 30.0–36.0)
MCV: 92.3 fL (ref 78.0–100.0)
MONOS PCT: 6 %
Monocytes Absolute: 0.3 10*3/uL (ref 0.1–1.0)
Neutro Abs: 1.7 10*3/uL (ref 1.7–7.7)
Neutrophils Relative %: 36 %
PLATELETS: 197 10*3/uL (ref 150–400)
RBC: 3.78 MIL/uL — ABNORMAL LOW (ref 4.22–5.81)
RDW: 13.8 % (ref 11.5–15.5)
WBC: 4.8 10*3/uL (ref 4.0–10.5)

## 2015-10-12 LAB — LIPID PANEL
Cholesterol: 129 mg/dL (ref 0–200)
HDL: 34 mg/dL — ABNORMAL LOW (ref >40)
LDL CALC: 69 mg/dL (ref 0–99)
Total CHOL/HDL Ratio: 3.8 RATIO
Triglycerides: 132 mg/dL (ref <150)
VLDL: 26 mg/dL (ref 0–40)

## 2015-10-12 LAB — HEMOGLOBIN A1C
HEMOGLOBIN A1C: 5.5 % (ref 4.8–5.6)
Mean Plasma Glucose: 111 mg/dL

## 2015-10-12 MED ORDER — DEXAMETHASONE SODIUM PHOSPHATE 4 MG/ML IJ SOLN
4.0000 mg | Freq: Four times a day (QID) | INTRAMUSCULAR | Status: DC
  Administered 2015-10-12 – 2015-10-13 (×6): 4 mg via INTRAVENOUS
  Filled 2015-10-12 (×6): qty 1

## 2015-10-12 NOTE — Progress Notes (Signed)
Patient ID: Paul Mullins, male   DOB: 1950/07/26, 65 y.o.   MRN: [id number]  PROGRESS NOTE    Paul Mullins  [name] DOB: November 19, 1950 DOA: 10/11/2015  PCP: Alfonso Patten, MD   Brief Narrative:  59 -year-old male with past medical history of seizures, hypertension, coronary artery disease, chronic systolic CHF, status post right femoral-popliteal bypass in 2014. He [facility] to [facility] [place] the day prior to the admission for weakness and confusion but subsequently discharged home. He then presented back to the hospital because of complains of distal right leg shaking and worsening weakness with fall. He was seen by neurology in consultation. They recommended Keppra in addition to Vimpat. MRI with and without contrast showed white matter edema, concerning for neoplasm.   Assessment & Plan:   Principal Problem: Acute encephalopathy - Probably related to white matter edema, possible malignancy as noted on MRI of the brain with and without contrast - Will follow-up on neurology recommendations. - Patient is currently oriented to time, place and person - PT evaluation pending  Active problems:  Seizures - Perhaps related to white matter edema and questionable malignancy seen on MRI of the brain - Patient is on Keppra and Vimpat per neurology recommendations - EEG is pending - As noted above, will follow-up on neurology recommendations. Will start low-dose Decadron for white matter edema.  Essential hypertension - Continue Norvasc 10 mg daily, clonidine 0.2 mg twice daily, isosorbide 90 mg daily, labetalol 200 mg twice daily  Dyslipidemia - Continue Lipitor 40 mg daily  Coronary artery disease, native artery without angina pectoris - Continue aspirin and Plavix  Chronic kidney disease stage III - Baseline creatinine 1.4 few months prior to this admission - Creatinine 2.05 on this admission - Not on any nephrotoxic medications - We'll continue low rate IV  fluids and follow-up BMP tomorrow morning   DVT prophylaxis: Heparin subQ Code Status: full code  Family Communication: no family at the bedside this am Disposition Plan: home once work up for AMS completed    Consultants:   Neurology   Procedures:   EEG 6/26 - pending   Antimicrobials:   None    Subjective: Feels better.   Objective: Filed Vitals:   10/12/15 0144 10/12/15 0519 10/12/15 0523 10/12/15 0914  BP: 124/66 118/63  133/62  Pulse: 79 79  73  Temp: 98.7 F (37.1 C) 98.7 F (37.1 C)  98.5 F (36.9 C)  TempSrc: Oral Oral  Oral  Resp: 15 16  18   Height:      Weight:   73.982 kg (163 lb 1.6 oz)   SpO2: 97% 97%  99%    Intake/Output Summary (Last 24 hours) at 10/12/15 1212 Last data filed at 10/12/15 1056  Gross per 24 hour  Intake   1115 ml  Output   1250 ml  Net   -135 ml   Filed Weights   10/11/15 1253 10/11/15 1803 10/12/15 0523  Weight: 61.1 kg (134 lb 11.2 oz) 71.714 kg (158 lb 1.6 oz) 73.982 kg (163 lb 1.6 oz)    Examination:  General exam: Appears calm and comfortable  Respiratory system: Clear to auscultation. Respiratory effort normal. Cardiovascular system: S1 & S2 heard, RRR. No JVD, murmurs, rubs, gallops or clicks. No pedal edema. Gastrointestinal system: Abdomen is nondistended, soft and nontender. No organomegaly or masses felt. Normal bowel sounds heard. Central nervous system: Alert and oriented. No focal neurological deficits. Extremities: Symmetric 5 x 5 power. Skin: No rashes, lesions or  ulcers Psychiatry: Judgement and insight appear normal. Mood & affect appropriate.   Data Reviewed: I have personally reviewed following labs and imaging studies  CBC:  Recent Labs Lab 10/11/15 1127 10/11/15 1653 10/12/15 0541  WBC 5.6 5.0 4.8  NEUTROABS 2.6  --  1.7  HGB 12.8* 12.8* 11.0*  HCT 39.1 39.2 34.9*  MCV 90.3 91.0 92.3  PLT 222 219 [id number]   Basic Metabolic Panel:  Recent Labs Lab 10/11/15 1127 10/11/15 1653  10/12/15 0541  NA 138  --  139  K 4.5  --  3.9  CL 105  --  110  CO2 24  --  21*  GLUCOSE 96  --  106*  BUN 20  --  28*  CREATININE 2.00* 2.05* 2.13*  CALCIUM 9.0  --  7.9*   GFR: Estimated Creatinine Clearance: 31.8 mL/min (by C-G formula based on Cr of 2.13). Liver Function Tests:  Recent Labs Lab 10/11/15 1127  AST 36  ALT 22  ALKPHOS 56  BILITOT 1.1  PROT 7.8  ALBUMIN 3.4*   No results for input(s): LIPASE, AMYLASE in the last 168 hours. No results for input(s): AMMONIA in the last 168 hours. Coagulation Profile: No results for input(s): INR, PROTIME in the last 168 hours. Cardiac Enzymes: No results for input(s): CKTOTAL, CKMB, CKMBINDEX, TROPONINI in the last 168 hours. BNP (last 3 results) No results for input(s): PROBNP in the last 8760 hours. HbA1C:  Recent Labs  10/11/15 1740  HGBA1C 5.5   CBG: No results for input(s): GLUCAP in the last 168 hours. Lipid Profile:  Recent Labs  10/12/15 0541  CHOL 129  HDL 34*  LDLCALC 69  TRIG 132  CHOLHDL 3.8   Thyroid Function Tests: No results for input(s): TSH, T4TOTAL, FREET4, T3FREE, THYROIDAB in the last 72 hours. Anemia Panel: No results for input(s): VITAMINB12, FOLATE, FERRITIN, TIBC, IRON, RETICCTPCT in the last 72 hours. Urine analysis:    Component Value Date/Time   COLORURINE YELLOW 08/08/2012 [place] 08/08/2012 1258   LABSPEC 1.011 08/08/2012 1258   PHURINE 7.0 08/08/2012 1258   GLUCOSEU NEGATIVE 08/08/2012 1258   HGBUR NEGATIVE 08/08/2012 1258   BILIRUBINUR NEGATIVE 08/08/2012 1258   KETONESUR NEGATIVE 08/08/2012 1258   PROTEINUR NEGATIVE 08/08/2012 1258   UROBILINOGEN 0.2 08/08/2012 1258   NITRITE NEGATIVE 08/08/2012 1258   LEUKOCYTESUR NEGATIVE 08/08/2012 1258   Sepsis Labs: @LABRCNTIP (procalcitonin:4,lacticidven:4)   )No results found for this or any previous visit (from the past 240 hour(s)).    Radiology Studies: Mr Jeri Cos [facility] Contrast 10/11/2015  1. Mild  further enlargement of the region of left parietal signal abnormality with small amount of petechial enhancement. 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 Subacute infarct would be expected to have shown noticeable improvement in this timeframe, even if there had been some additional ischemia in the interim. Cerebritis could also give this appearance though does not fit the clinical presentation. 2. Mild left parietal cortical diffusion abnormality likely reflects recent seizure activity.   Dg Chest Portable 1 View 10/11/2015 No acute abnormalities. Electronically Signed   By: Lavonia Dana M.D.   On: 10/11/2015 12:57      Scheduled Meds: . amLODipine  10 mg Oral Daily  . aspirin EC  81 mg Oral Daily  . atorvastatin  40 mg Oral q1800  . cloNIDine  0.2 mg Oral BID  .  clopidogrel  75 mg Oral Daily  . gabapentin  300 mg Oral TID  . heparin  5,000 Units Subcutaneous BID  . isosorbide mononitrate  90 mg Oral Daily  . labetalol  200 mg Oral BID  . lacosamide (VIMPAT) IV  200 mg Intravenous Q12H  . levETIRAcetam  1,500 mg Intravenous Q12H  . pantoprazole  40 mg Oral Daily   Continuous Infusions: . sodium chloride 75 mL/hr (10/12/15 0945)     LOS: 1 day    Time spent: 25 minutes Greater than 50% of the time spent on counseling and coordinating the care.   Leisa Lenz, MD [facility] Pager [phone]  If 7PM-7AM, please contact night-coverage [email or web address] Password [email or web address] 10/12/2015, 12:12 PM

## 2015-10-12 NOTE — Progress Notes (Signed)
Pt unable to void overnight, bladder scan showed 356cc, Fredirick Maudlin NP notified and obtained an order for In & out cath.

## 2015-10-12 NOTE — Care Management Important Message (Signed)
Important Message  Patient Details  Name: Paul Mullins MRN: [id number] Date of Birth: 11/30/1950   Medicare Important Message Given:  Yes    [name] 10/12/2015, 8:47 AM

## 2015-10-12 NOTE — Care Management Note (Signed)
Case Management Note  Patient Details  Name: Paul Mullins MRN: [id number] Date of Birth: Jun 12, 1950  Subjective/Objective:    Admitted with Hypertension                Action/Plan: Patient recently discharged from [facility] home; He lives alone and his girlfriend and her daughter assist him as needed. CM talked to patient about medication compliancy; pt stated that he ran out of his medication, he also stated that he could have borrowed the money to cover the cost of his medication but he was not thinking at the time. Patient has private insurance with Medicare; pharmacy of choice is [facility]- he states not problem getting his medication. Awaiting for PT eval for home vs short term SNF. CM will continue to follow for DCP;  Expected Discharge Date:    possibly 10/15/2015              Expected Discharge Plan:  [place] vs HHC; awaiting for PT eval  Discharge planning Services  CM Consult Choice offered to:  Patient  Status of Service:  In process, will continue to follow  Sherrilyn Rist [id number] 10/12/2015, 2:14 PM

## 2015-10-12 NOTE — Evaluation (Signed)
Physical Therapy Evaluation Patient Details Name: Paul Mullins MRN: [id number] DOB: 09-26-1950 Today's Date: 10/12/2015   History of Present Illness  Pt is a 65 y/o M who presented w/ c/o distal Rt LE shaking and worsening weakness w/ fall.  MRI showed white matter edema, concerning for neoplasm.  Of note, pt ran out of his Keppra a few days prior to admission.  Pt's PMH includes neck surgery, Rt fem-pop bypass, PVD, CHF.    Clinical Impression  Pt admitted with above diagnosis. Pt currently with functional limitations due to the deficits listed below (see PT Problem List). Mr. Kosak presents w/ impaired coordination all extremities and currently requires mod assist for safe sit<>stand transfer and side steps at bedside.  PTA pt at mod I level of functioning and living alone.  Recommend CIR as pt is highly motivated to regain independence.  Anticipate that with intensive therapy services pt will be able to reach mod I level of functioning.  Pt will benefit from skilled PT to increase their independence and safety with mobility to allow discharge to the venue listed below.      Follow Up Recommendations CIR;Supervision for mobility/OOB    Equipment Recommendations  Other (comment) (TBD at next venue of care)    Recommendations for Other Services OT consult;Rehab consult     Precautions / Restrictions Precautions Precautions: Fall;Other (comment) Precaution Comments: Seizure Restrictions Weight Bearing Restrictions: No      Mobility  Bed Mobility Overal bed mobility: Needs Assistance Bed Mobility: Supine to Sit;Sit to Supine     Supine to sit: Min guard;HOB elevated Sit to supine: Min guard   General bed mobility comments: Poor coordination, requring increased time and cues.    Transfers Overall transfer level: Needs assistance Equipment used: Rolling walker (2 wheeled) Transfers: Sit to/from Stand Sit to Stand: Mod assist         General transfer comment: Assist to  steady and cues for hand placement.  Flexed posture upon standing which pt is able to slightly correct w/ cues.  Ambulation/Gait Ambulation/Gait assistance: Mod assist Ambulation Distance (Feet): 3 Feet Assistive device: Rolling walker (2 wheeled) Gait Pattern/deviations: Staggering right;Staggering left;Trunk flexed;Narrow base of support (side steps)   Gait velocity interpretation: <1.8 ft/sec, indicative of risk for recurrent falls General Gait Details: Side steps at bedside for pt/therapist safety.  Pt requires mod assist to steady due to impaired coordination all extremities.  Rt knee hyperextended w/ knee buckle if allowed to bend.   Stairs            Wheelchair Mobility    Modified Rankin (Stroke Patients Only)       Balance Overall balance assessment: Needs assistance;History of Falls Sitting-balance support: Feet supported;Single extremity supported Sitting balance-Leahy Scale: Poor Sitting balance - Comments: At least 1 UE supported while sitting EOB for stability   Standing balance support: Bilateral upper extremity supported;During functional activity Standing balance-Leahy Scale: Poor Standing balance comment: Relies on RW and physical assist to steady                             Pertinent Vitals/Pain Pain Assessment: No/denies pain    Home Living Family/patient expects to be discharged to:: Private residence Living Arrangements: Alone Available Help at Discharge: Friend(s);Available PRN/intermittently Type of Home: Apartment Home Access: Level entry     Home Layout: One level Home Equipment: Walker - 2 wheels      Prior Function Level of Independence:  Independent         Comments: Takes a sponge bath standing at his sink without assist.  Still driving, does the cooking and cleaning.  Ambulates without AD.       Hand Dominance   Dominant Hand: Right    Extremity/Trunk Assessment   Upper Extremity Assessment: Defer to OT  evaluation;RUE deficits/detail;LUE deficits/detail           Lower Extremity Assessment: RLE deficits/detail;LLE deficits/detail RLE Deficits / Details: Rt LE twitching throughout session and worsens w/ WB activity.  MMT strength: DF: 1/5, otherwise strength grossly 3/5 LLE Deficits / Details: Strength grossly 4/5     Communication   Communication: HOH  Cognition Arousal/Alertness: Awake/alert Behavior During Therapy: WFL for tasks assessed/performed Overall Cognitive Status: No family/caregiver present to determine baseline cognitive functioning                      General Comments      Exercises        Assessment/Plan    PT Assessment Patient needs continued PT services  PT Diagnosis Difficulty walking;Abnormality of gait   PT Problem List Decreased strength;Decreased activity tolerance;Decreased balance;Decreased mobility;Decreased coordination;Decreased cognition;Decreased knowledge of use of DME;Decreased safety awareness;Impaired sensation  PT Treatment Interventions DME instruction;Gait training;Functional mobility training;Therapeutic activities;Therapeutic exercise;Balance training;Neuromuscular re-education;Cognitive remediation;Patient/family education;Wheelchair mobility training   PT Goals (Current goals can be found in the Care Plan section) Acute Rehab PT Goals Patient Stated Goal: to regain his independence PT Goal Formulation: With patient Time For Goal Achievement: 10/26/15 Potential to Achieve Goals: Good    Frequency Min 3X/week   Barriers to discharge Decreased caregiver support Lives alone    Co-evaluation               End of Session Equipment Utilized During Treatment: Gait belt Activity Tolerance: Patient limited by fatigue Patient left: in bed;with call bell/phone within reach;with bed alarm set Nurse Communication: Mobility status         Time: [id number] PT Time Calculation (min) (ACUTE ONLY): 29 min   Charges:    PT Evaluation $PT Eval Moderate Complexity: 1 Procedure PT Treatments $Therapeutic Activity: 8-22 mins   PT G Codes:       Collie Siad PT, DPT  Pager: [phone] Phone: [phone] 10/12/2015, 4:00 PM

## 2015-10-12 NOTE — Progress Notes (Signed)
Pt was able to void on his own, so In & out cath was deferred at this time.

## 2015-10-13 ENCOUNTER — Inpatient Hospital Stay (HOSPITAL_COMMUNITY): Payer: Medicare HMO

## 2015-10-13 DIAGNOSIS — D62 Acute posthemorrhagic anemia: Secondary | ICD-10-CM | POA: Insufficient documentation

## 2015-10-13 DIAGNOSIS — I739 Peripheral vascular disease, unspecified: Secondary | ICD-10-CM

## 2015-10-13 DIAGNOSIS — I2510 Atherosclerotic heart disease of native coronary artery without angina pectoris: Secondary | ICD-10-CM

## 2015-10-13 DIAGNOSIS — D499 Neoplasm of unspecified behavior of unspecified site: Secondary | ICD-10-CM

## 2015-10-13 DIAGNOSIS — I5032 Chronic diastolic (congestive) heart failure: Secondary | ICD-10-CM | POA: Insufficient documentation

## 2015-10-13 DIAGNOSIS — G40909 Epilepsy, unspecified, not intractable, without status epilepticus: Secondary | ICD-10-CM | POA: Insufficient documentation

## 2015-10-13 LAB — CBC
HCT: 32.1 % — ABNORMAL LOW (ref 39.0–52.0)
Hemoglobin: 10.1 g/dL — ABNORMAL LOW (ref 13.0–17.0)
MCH: 28.9 pg (ref 26.0–34.0)
MCHC: 31.5 g/dL (ref 30.0–36.0)
MCV: 92 fL (ref 78.0–100.0)
PLATELETS: 217 10*3/uL (ref 150–400)
RBC: 3.49 MIL/uL — AB (ref 4.22–5.81)
RDW: 13.8 % (ref 11.5–15.5)
WBC: 4 10*3/uL (ref 4.0–10.5)

## 2015-10-13 LAB — BASIC METABOLIC PANEL
Anion gap: 7 (ref 5–15)
BUN: 24 mg/dL — AB (ref 6–20)
CO2: 23 mmol/L (ref 22–32)
Calcium: 7.7 mg/dL — ABNORMAL LOW (ref 8.9–10.3)
Chloride: 106 mmol/L (ref 101–111)
Creatinine, Ser: 1.93 mg/dL — ABNORMAL HIGH (ref 0.61–1.24)
GFR calc non Af Amer: 35 mL/min — ABNORMAL LOW (ref >60)
GFR, EST AFRICAN AMERICAN: 40 mL/min — AB (ref >60)
GLUCOSE: 139 mg/dL — AB (ref 65–99)
POTASSIUM: 4.1 mmol/L (ref 3.5–5.1)
SODIUM: 136 mmol/L (ref 135–145)

## 2015-10-13 MED ORDER — CLOBAZAM 10 MG PO TABS
10.0000 mg | ORAL_TABLET | Freq: Two times a day (BID) | ORAL | Status: DC
  Administered 2015-10-13: 10 mg via ORAL
  Filled 2015-10-13: qty 1

## 2015-10-13 NOTE — Consult Note (Signed)
Physical Medicine and Rehabilitation Consult Reason for Consult: White matter edema concerning for neoplasm Referring Physician: [facility]   HPI: Paul Mullins is a 65 y.o. right hand male with history of hypertension, chronic systolic congestive heart failure, chronic renal insufficiency with creatinine 2.00, CAD maintained on aspirin and Plavix, peripheral vascular disease status post right femoral-popliteal bypass 2014 and seizure disorder on Keppra but recently ran out of his medication several days before initial presentation to the hospital. Per chart review patient lives alone independent prior to admission and still driving . One level apartment. Patient presented to [facility]  with shaking of his right leg and a fall with head laceration that was sutured and will later discharged to home. For details of admission were not made available. Presented to [facility]11[facility]2015 with right leg shaking and altered mental status. Recent EEG negative. MRI of the brain showed enlargement of the region of left parietal signal abnormality was small amount of petechial enhancement concerning for neoplasm. Mild left parietal cortical diffusion abnormality reflecting recent seizure activity. Neurology consulted with workup presently ongoing and maintained on Keppra with the addition of Vimpat. Follow-up EEG is pending. Subcutaneous heparin for DVT prophylaxis. Decadron initiated for white matter edema. Remains on aspirin and Plavix as prior to admission. Tolerating a regular diet. Physical therapy evaluation completed 10/12/2015 with recommendations of physical medicine rehabilitation consult.   Review of Systems  Constitutional: Negative for fever and chills.  HENT: Negative for hearing loss.   Eyes: Negative for blurred vision and double vision.  Respiratory: Negative for cough and shortness of breath.   Cardiovascular: Negative for chest pain and leg swelling.    Gastrointestinal: Positive for constipation. Negative for nausea and vomiting.  Genitourinary: Positive for urgency. Negative for dysuria and hematuria.  Musculoskeletal: Positive for falls.  Skin: Negative for rash.  Neurological: Positive for seizures. Negative for headaches.  All other systems reviewed and are negative.  Past Medical History  Diagnosis Date  . Hypertension   . Anginal pain ([place])   . Peripheral vascular disease ([place])   . Chronic systolic CHF (congestive heart failure) [facility])     [facility]; Dr. Rich Reining  . Coronary artery disease     08/08/12 - patient denied CAD, but is followed for CHF  . 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 Heart murmur     "since birth"; small membraneous VSD by 01/2012 echo   Past Surgical History  Procedure Laterality Date  . Neck surgery    . Neck surgery    . Stones    . Gallstones    . Femoral-popliteal bypass graft Right 08/17/2012    Procedure: BYPASS GRAFT FEMORAL-POPLITEAL ARTERY;  Surgeon: Mal Misty, MD;  Location: [facility];  Service: Vascular;  Laterality: Right;  using non reversed Sapphenous vein with intraoperative arteriogram.  . Abdominal aortagram N/A 07/05/2012    Procedure: ABDOMINAL Maxcine Ham;  Surgeon: Conrad Keya Paha, MD;  Location: [facility] CATH LAB;  Service: Cardiovascular;  Laterality: N/A;   No pertinent family history. Social History:  reports that he quit smoking about 3 years ago. His smoking use included Cigarettes. He has never used smokeless tobacco. He reports that he does not drink alcohol or use illicit drugs. Allergies:  Allergies  Allergen Reactions  . Ace Inhibitors Other (See Comments)    Impaired  Kidney Function   Medications Prior to  Admission  Medication Sig Dispense Refill  . amLODipine (NORVASC) 10 MG tablet Take 10 mg by mouth daily.    Marland Kitchen aspirin EC 81 MG tablet Take 81 mg by mouth daily.    Marland Kitchen atorvastatin  (LIPITOR) 40 MG tablet Take 40 mg by mouth daily.    . cloNIDine (CATAPRES) 0.2 MG tablet Take 0.2 mg by mouth 2 (two) times daily.     . clopidogrel (PLAVIX) 75 MG tablet Take 75 mg by mouth daily.    Marland Kitchen gabapentin (NEURONTIN) 300 MG capsule Take 300 mg by mouth 3 (three) times daily.    . isosorbide mononitrate (IMDUR) 30 MG 24 hr tablet Take 90 mg by mouth daily.    Marland Kitchen labetalol (NORMODYNE) 200 MG tablet Take 200 mg by mouth 2 (two) times daily.    Marland Kitchen levETIRAcetam (KEPPRA) 250 MG tablet Take 500 mg by mouth 2 (two) times daily.    . nitroGLYCERIN (NITROSTAT) 0.4 MG SL tablet Place 0.4 mg under the tongue every 5 (five) minutes as needed for chest pain.    . pantoprazole (PROTONIX) 20 MG tablet Take 40 mg by mouth daily.      Home: Home Living Family/patient expects to be discharged to:: Private residence Living Arrangements: Alone Available Help at Discharge: Friend(s), Available PRN/intermittently Type of Home: Apartment Home Access: Level entry [place]: One level Bathroom Shower/Tub: Chiropodist: Standard Home Equipment: Environmental consultant - 2 wheels  Functional History: Prior Function Level of Independence: Independent Comments: Takes a sponge bath standing at his sink without assist.  Still driving, does the cooking and cleaning.  Ambulates without AD.   Functional Status:  Mobility: Bed Mobility Overal bed mobility: Needs Assistance Bed Mobility: Supine to Sit, Sit to Supine Supine to sit: Min guard, HOB elevated Sit to supine: Min guard General bed mobility comments: Poor coordination, requring increased time and cues.   Transfers Overall transfer level: Needs assistance Equipment used: Rolling walker (2 wheeled) Transfers: Sit to/from Stand Sit to Stand: Mod assist General transfer comment: Assist to steady and cues for hand placement.  Flexed posture upon standing which pt is able to slightly correct w/ cues. Ambulation/Gait Ambulation/Gait assistance:  Mod assist Ambulation Distance (Feet): 3 Feet Assistive device: Rolling walker (2 wheeled) Gait Pattern/deviations: Staggering right, Staggering left, Trunk flexed, Narrow base of support (side steps) General Gait Details: Side steps at bedside for pt/therapist safety.  Pt requires mod assist to steady due to impaired coordination all extremities.  Rt knee hyperextended w/ knee buckle if allowed to bend.  Gait velocity interpretation: <1.8 ft/sec, indicative of risk for recurrent falls    ADL:    Cognition: Cognition Overall Cognitive Status: No family/caregiver present to determine baseline cognitive functioning Orientation Level: Oriented to person, Oriented to place, Oriented to time, Oriented to situation Cognition Arousal/Alertness: Awake/alert Behavior During Therapy: [facility] for tasks assessed/performed Overall Cognitive Status: No family/caregiver present to determine baseline cognitive functioning  Blood pressure 152/63, pulse 82, temperature 98 F (36.7 C), temperature source Oral, resp. rate 18, height 5\' 4"  (1.626 m), weight 76.749 kg (169 lb 3.2 oz), SpO2 100 %. Physical Exam  Vitals reviewed. Constitutional: He appears well-developed and well-nourished.  HENT:  Head: Normocephalic.  Sutures to the scalp  Eyes: Conjunctivae and EOM are normal.  Neck: Normal range of motion. Neck supple. No thyromegaly present.  Cardiovascular: Normal rate and regular rhythm.   Respiratory: Effort normal and breath sounds normal. No respiratory distress.  GI: Soft. Bowel sounds are normal.  He exhibits no distension.  Musculoskeletal: He exhibits no edema or tenderness.  Neurological: He is alert.  Makes good eye contact with examiner.  He is able to provide his name age date of birth.  Follows simple commands.  Motor: B/l UE 5/5 proximal to distal LLE: 4+/5 proximal to distal RLE: 3-/5 proximal to distal with ?apraxia  Skin: Skin is warm and dry.  Psychiatric: He has a normal mood  and affect. His behavior is normal.    Results for orders placed or performed during the hospital encounter of 10/11/15 (from the past 24 hour(s))  CBC     Status: Abnormal   Collection Time: 10/13/15  4:56 AM  Result Value Ref Range   WBC 4.0 4.0 - 10.5 K/uL   RBC 3.49 (L) 4.22 - 5.81 MIL/uL   Hemoglobin 10.1 (L) 13.0 - 17.0 g/dL   HCT 32.1 (L) 39.0 - 52.0 %   MCV 92.0 78.0 - 100.0 fL   MCH 28.9 26.0 - 34.0 pg   MCHC 31.5 30.0 - 36.0 g/dL   RDW 13.8 11.5 - 15.5 %   Platelets 217 150 - 400 K/uL   Mr Jeri Cos Wo Contrast  10/11/2015  CLINICAL DATA:  Seizures. EXAM: MRI HEAD WITHOUT AND WITH CONTRAST TECHNIQUE: Multiplanar, multiecho pulse sequences of the brain and surrounding structures were obtained without and with intravenous contrast. CONTRAST:  30mL MULTIHANCE GADOBENATE DIMEGLUMINE 529 MG/ML IV SOLN COMPARISON:  Head CT 10/07/2015 and MRI 08/19/2015 FINDINGS: Confluent region of T2 hyperintensity/edema in the left parietal lobe and posterior cingulate gyrus has mildly progressed from the prior MRI, again with mild mass effect with sulcal effacement. Diffusion is facilitated, and this extends over approximately 7 mm. There is new left parietal cortical trace diffusion signal abnormality at the margins of this region with relatively normal ADC. Small foci of susceptibility artifact within this region are unchanged and compatible with remote hemorrhages. There is some mildly prominent leptomeningeal vascular enhancement in this region as well as some non masslike petechial parenchymal enhancement. Elsewhere, there is no evidence of acute infarct. No extra-axial fluid collection or midline shift. Slight mass effect on the left lateral ventricle from the left parietal abnormality, with the ventricles otherwise normal in size. Scattered, small foci of T2 hyperintensity elsewhere in the cerebral white matter bilaterally are unchanged and nonspecific but compatible with mild chronic small vessel  ischemic disease. No abnormal enhancement is identified elsewhere. Orbits are unremarkable. Mild mucosal thickening is noted in the paranasal sinuses, and there is a small chronic right mastoid effusion. Major intracranial vascular flow voids are unchanged. IMPRESSION: 1. Mild further enlargement of the region of left parietal signal abnormality with small amount of petechial enhancement. 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 Subacute infarct would be expected to have shown noticeable improvement in this timeframe, even if there had been some additional ischemia in the interim. Cerebritis could also give this appearance though does not fit the clinical presentation. 2. Mild left parietal cortical diffusion abnormality likely reflects recent seizure activity. Electronically Signed   By: Logan Bores M.D.   On: 10/11/2015 16:35   Dg Chest Portable 1 View  10/11/2015  CLINICAL DATA:  Seizure like activity, mid sternal chest pain, recent slightly productive cough for 1 week, history hypertension, CHF, coronary artery disease, VSD EXAM: PORTABLE CHEST 1 VIEW COMPARISON:  Portable exam 0033 hours compared  to 08/19/2015 FINDINGS: Normal heart size, mediastinal contours, and pulmonary vascularity. Lungs clear. No pleural effusion or pneumothorax. Bones unremarkable. IMPRESSION: No acute abnormalities. Electronically Signed   By: Lavonia Dana M.D.   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  Records reviewed and summated above.  1. Does the need for close, 24 hr/day medical supervision in concert with the patient's rehab needs make it unreasonable for this patient to be served in a less intensive setting? Yes  2. Co-Morbidities requiring supervision/potential complications: HTN (monitor and provide prns in  accordance with increased physical exertion and pain), chronic systolic congestive heart failure (Monitor in accordance with increased physical activity and avoid UE resistance exercise), chronic renal insufficiency (avoid nephrotoxic meds), CAD (cont meds), peripheral vascular disease (cont meds), seizure disorder (cont meds), ABLA (transfuse if necessary to ensure appropriate perfusion for increased activity tolerance) 3. Due to safety, skin/wound care, disease management and patient education, does the patient require 24 hr/day rehab nursing? Yes 4. Does the patient require coordinated care of a physician, rehab nurse, PT (1-2 hrs/day, 5 days/week) and OT (1-2 hrs/day, 5 days/week) to address physical and functional deficits in the context of the above medical diagnosis(es)? Yes Addressing deficits in the following areas: balance, endurance, locomotion, strength, transferring, toileting and psychosocial support 5. Can the patient actively participate in an intensive therapy program of at least 3 hrs of therapy per day at least 5 days per week? Yes 6. The potential for patient to make measurable gains while on inpatient rehab is excellent 7. Anticipated functional outcomes upon discharge from inpatient rehab are supervision and min assist  with PT, supervision and min assist with OT, n/a with SLP. 8. Estimated rehab length of stay to reach the above functional goals is: 12-17 days. 9. Does the patient have adequate social supports and living environment to accommodate these discharge functional goals? Potentially 10. Anticipated D/C setting: Home 11. Anticipated post D/C treatments: HH therapy and Home excercise program 12. Overall Rehab/Functional Prognosis: good  RECOMMENDATIONS: This patient's condition is appropriate for continued rehabilitative care in the following setting: CIR after completion of medical workup. workup   Patient has agreed to participate in recommended program. Yes Note  that insurance prior authorization may be required for reimbursement for recommended care.  Comment: Rehab Admissions Coordinator to follow up.  Delice Lesch, MD 10/13/2015

## 2015-10-13 NOTE — Progress Notes (Signed)
Subjective: States he is unaware of having any seizures, and is feeling good.  States his right leg shakes when he lifts it.  Exam: Filed Vitals:   10/13/15 0300 10/13/15 1203  BP: 152/63 169/76  Pulse: 82 86  Temp: 98 F (36.7 C) 98.3 F (36.8 C)  Resp: 18 20    HEENT-  Normocephalic, no lesions, without obvious abnormality.  Normal external eye.  Normal TM's bilaterally.  Normal  external ears. Normal external nose.  Normal pharynx. Poor dentition. Cardiovascular- S1, S2 normal, pulses palpable throughout   Lungs- chest clear, no wheezing, rales, normal symmetric air entry Abdomen- soft, non-tender; bowel sounds normal; no masses,  no organomegaly Extremities- less then 2 second capillary refill Lymph-no adenopathy palpable Musculoskeletal-no joint tenderness, deformity or swelling Skin-dry    Gen: In bed, NAD, eating dinner MS: Awake, alert, oriented, and able to give a clear and coherent history CN: II-XII intact Motor: Tone is normal. Bulk is normal. 5/5 strength bilateral upper extremities.  There is continuous clonic activity throughout the right leg. Left leg 5/5 strength Sensory: Symmetric bilaterally   Pertinent Labs/Diagnostics: Cr 1.93 MRI Brain: 1. Mild further enlargement of the region of left parietal signal abnormality with small amount of petechial enhancement. 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 Subacute infarct would be expected to have shown noticeable improvement in this timeframe, even if there had been some additional ischemia in the interim. Cerebritis could also give this appearance though does not fit the clinical presentation. 2. Mild left parietal cortical diffusion abnormality likely reflects recent seizure activity.  April Pugh, MSN, RN Neurology [phone]  Impression: 65 yo male with a history of seizures, though  he is not aware of this, but has been taking Keppra but ran out of it several days before his initial presentation [place] [facility]. He then began having shaking of his right leg and presented to [facility]. His family states that his right leg has been jerking nonstop since his initial presentation on 6/21. He continues with left leg shaking suggestive of possible partial status in the setting of MRI concerning for neoplasm.  Will add Onfi 10mg  po bid to current AED regimen.   Recommendations: 1) Neurosurgical consult 2) Cont. EEG overnight 3) Start Onfi 10mg  po bid    10/13/2015, 5:34 PM

## 2015-10-13 NOTE — Progress Notes (Signed)
VLTM EEG running educated nursing staff/ tested event button/ notified neuro

## 2015-10-13 NOTE — Progress Notes (Addendum)
Patient ID: Paul Mullins, male   DOB: 1950-05-06, 65 y.o.   MRN: [id number]  PROGRESS NOTE    Paul Mullins  [name] DOB: 22-Jul-1950 DOA: 10/11/2015  PCP: Alfonso Patten, MD   Brief Narrative:  65 -year-old male with past medical history of seizures, hypertension, coronary artery disease, chronic systolic CHF, status post right femoral-popliteal bypass in 2014. He presented [facility] the day prior to the admission for weakness and confusion but subsequently discharged home. He then presented back to the hospital because of complains of distal right leg shaking and worsening weakness with fall. He was seen by neurology in consultation. They recommended Keppra in addition to Vimpat. MRI with and without contrast showed white matter edema, concerning for neoplasm.   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 Irene Limbo of oncology for input on MRI findings, awaiting response  - Started decadron 4 mg IV Q 6 hours 10/12/2015, per neuro okay to stop so stopped today Addendum: 7 pm: spoke with neurosx - recommend optimize seizure meds, follow up MRI brain in near future to monitor this edema, no role in biopsy now as no focal lesion. Metastatic work up recommended as well but okay to see if renal function improves to normal  to dot contrast study as it will likely give highest yield.  Essential hypertension - Continue Norvasc 10 mg daily, clonidine 0.2 mg twice daily, isosorbide 90 mg daily, labetalol 200 mg twice daily  Dyslipidemia - Continue Lipitor 40 mg daily  Coronary artery disease, native artery without angina pectoris - Continue aspirin and Plavix  Chronic kidney disease stage III - Baseline creatinine 1.4 few months prior to this admission - Creatinine 2.05 on this admission and further up to 2.13 but fortunately improving to 1.93 this am  - Not on any nephrotoxic medications  Anemia of chronic kidney disease  - Hemoglobin stable at 10.1    DVT prophylaxis: Heparin subQ Code Status: full code  Family Communication: no family at the bedside this am Disposition Plan: home once work up for AMS completed    Consultants:   Neurology   Procedures:   EEG 6/26 - pending   Antimicrobials:   None    Subjective: Feels better.   Objective: Filed Vitals:   10/12/15 2100 10/13/15 0003 10/13/15 0300 10/13/15 1203  BP: 140/71 137/68 152/63 169/76  Pulse: 78 79 82 86  Temp: 98.7 F (37.1 C) 98.1 F (36.7 C) 98 F (36.7 C) 98.3 F (36.8 C)  TempSrc: Oral Oral Oral Oral  Resp: 18 17 18 20   Height:      Weight:   76.749 kg (169 lb 3.2 oz)   SpO2: 100% 100% 100% 100%    Intake/Output Summary (Last 24 hours) at 10/13/15 1421 Last data filed at 10/13/15 1334  Gross per 24 hour  Intake 4392.5 ml  Output    900 ml  Net 3492.5 ml   Filed Weights   10/11/15 1803 10/12/15 0523 10/13/15 0300  Weight: 71.714 kg (158 lb 1.6 oz) 73.982 kg (163 lb 1.6 oz) 76.749 kg (169 lb 3.2 oz)    Examination:  General exam: Appears calm and comfortable  Respiratory system: Clear to auscultation. Respiratory effort normal. Cardiovascular system: S1 & S2 heard, RRR. No JVD, murmurs, rubs, gallops or clicks. No pedal edema. Gastrointestinal system: Abdomen is nondistended, soft and nontender. No  organomegaly or masses felt. Normal bowel sounds heard. Central nervous system: Alert and oriented. No focal neurological deficits. Extremities: Symmetric 5 x 5 power. Skin: No rashes, lesions or ulcers Psychiatry: Judgement and insight appear normal. Mood & affect appropriate.   Data Reviewed: I have personally reviewed following labs and imaging studies  CBC:  Recent Labs Lab 10/11/15 1127 10/11/15 1653 10/12/15 0541 10/13/15 0456  WBC 5.6 5.0 4.8 4.0  NEUTROABS 2.6  --  1.7  --   HGB 12.8* 12.8* 11.0* 10.1*  HCT 39.1 39.2 34.9* 32.1*  MCV 90.3 91.0 92.3 92.0  PLT 222 219 197 [id number]   Basic Metabolic Panel:  Recent Labs Lab 10/11/15 1127 10/11/15 1653 10/12/15 0541 10/13/15 0456  NA 138  --  139 136  K 4.5  --  3.9 4.1  CL 105  --  110 106  CO2 24  --  21* 23  GLUCOSE 96  --  106* 139*  BUN 20  --  28* 24*  CREATININE 2.00* 2.05* 2.13* 1.93*  CALCIUM 9.0  --  7.9* 7.7*   GFR: Estimated Creatinine Clearance: 35.7 mL/min (by C-G formula based on Cr of 1.93). Liver Function Tests:  Recent Labs Lab 10/11/15 1127  AST 36  ALT 22  ALKPHOS 56  BILITOT 1.1  PROT 7.8  ALBUMIN 3.4*   No results for input(s): LIPASE, AMYLASE in the last 168 hours. No results for input(s): AMMONIA in the last 168 hours. Coagulation Profile: No results for input(s): INR, PROTIME in the last 168 hours. Cardiac Enzymes: No results for input(s): CKTOTAL, CKMB, CKMBINDEX, TROPONINI in the last 168 hours. BNP (last 3 results) No results for input(s): PROBNP in the last 8760 hours. HbA1C:  Recent Labs  10/11/15 1740  HGBA1C 5.5   CBG: No results for input(s): GLUCAP in the last 168 hours. Lipid Profile:  Recent Labs  10/12/15 0541  CHOL 129  HDL 34*  LDLCALC 69  TRIG 132  CHOLHDL 3.8   Thyroid Function Tests: No results for input(s): TSH, T4TOTAL, FREET4, T3FREE, THYROIDAB in the last 72 hours. Anemia Panel: No results for input(s): VITAMINB12, FOLATE, FERRITIN, TIBC,  IRON, RETICCTPCT in the last 72 hours. Urine analysis:    Component Value Date/Time   COLORURINE YELLOW 08/08/2012 [place] 08/08/2012 1258   LABSPEC 1.011 08/08/2012 1258   PHURINE 7.0 08/08/2012 1258   GLUCOSEU NEGATIVE 08/08/2012 1258   HGBUR NEGATIVE 08/08/2012 1258   BILIRUBINUR NEGATIVE 08/08/2012 1258   KETONESUR NEGATIVE 08/08/2012 1258   PROTEINUR NEGATIVE 08/08/2012 1258   UROBILINOGEN 0.2 08/08/2012 1258   NITRITE NEGATIVE 08/08/2012 1258   LEUKOCYTESUR NEGATIVE 08/08/2012 1258   Sepsis Labs: @LABRCNTIP (procalcitonin:4,lacticidven:4)   )No results found for this or any previous visit (from the past 240 hour(s)).    Radiology Studies: Mr Jeri [facility] Contrast 10/11/2015  1. Mild further enlargement of the region of left parietal signal abnormality with small amount of petechial  enhancement. 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 Subacute infarct would be expected to have shown noticeable improvement in this timeframe, even if there had been some additional ischemia in the interim. Cerebritis could also give this appearance though does not fit the clinical presentation. 2. Mild left parietal cortical diffusion abnormality likely reflects recent seizure activity.   Dg Chest Portable 1 View 10/11/2015 No acute abnormalities. Electronically Signed   By: Lavonia Dana M.D.   On: 10/11/2015 12:57      Scheduled Meds: . amLODipine  10 mg Oral Daily  . aspirin EC  81 mg Oral Daily  . atorvastatin  40 mg Oral q1800  . cloNIDine  0.2 mg Oral BID  . clopidogrel  75 mg Oral Daily  . dexamethasone  4 mg Intravenous Q6H  . gabapentin  300 mg Oral TID  . heparin  5,000 Units Subcutaneous BID  . isosorbide mononitrate  90 mg Oral Daily  . labetalol  200 mg Oral BID  . lacosamide (VIMPAT) IV  200 mg Intravenous Q12H  . levETIRAcetam  1,500 mg  Intravenous Q12H  . pantoprazole  40 mg Oral Daily   Continuous Infusions: . sodium chloride 75 mL/hr (10/13/15 0107)     LOS: 2 days    Time spent: 15 minutes Greater than 50% of the time spent on counseling and coordinating the care.   Leisa Lenz, MD [facility] Pager [phone]  If 7PM-7AM, please contact night-coverage [email or web address] Password [email or web address] 10/13/2015, 2:21 PM

## 2015-10-13 NOTE — Clinical Social Work Note (Signed)
Clinical Social Work Assessment  Patient Details  Name: Paul Mullins MRN: [id number] Date of Birth: 05-Jun-1950  Date of referral:  10/13/15               Reason for consult:  Facility Placement, Discharge Planning                Permission sought to share information with:  Facility Sport and exercise psychologist, Family Supports Permission granted to share information::  Yes, Verbal Permission Granted  Name::     Edward Jolly  Agency::  SNF's  Relationship::  Relative  Contact Information:  [phone]  Housing/Transportation Living arrangements for the past 2 months:  Apartment Source of Information:  Patient, Medical Team Patient Interpreter Needed:  None Criminal Activity/Legal Involvement Pertinent to Current Situation/Hospitalization:  No - Comment as needed Significant Relationships:  Other Family Members Lives with:  Self Do you feel safe going back to the place where you live?  Yes Need for family participation in patient care:  Yes (Comment)  Care giving concerns:  PT recommending CIR. Patient aware of SNF process as well.   Social Worker assessment / plan:  CSW met with patient. No supports at bedside. CSW introduced role and explained that discharge planning would be discussed. CSW discussed CIR recommendation as well as possible plan for SNF due to walking only 3 feet. Patient agreeable to SNF placement if needed. No further concerns. CSW encouraged patient to contact CSW as needed. CSW will continue to follow patient and facilitate discharge to SNF once medically stable if needed.  Employment status:  Retired Forensic scientist:  Medicare PT Recommendations:  Inpatient Alligator / Referral to community resources:  [place]  Patient/Family's Response to care:  Patient agreeable to SNF placement. Patient's relative supportive and involved in patient's care. Patient appreciated social work intervention.  Patient/Family's Understanding of  and Emotional Response to Diagnosis, Current Treatment, and Prognosis:  Patient understanding of need for rehab before returning home. He walked only 3 feet in PT.   Emotional Assessment Appearance:  Appears stated age Attitude/Demeanor/Rapport:  Other (Pleasant) Affect (typically observed):  Accepting, Appropriate, Calm, Pleasant Orientation:  Oriented to Self, Oriented to Place, Oriented to  Time, Oriented to Situation Alcohol / Substance use:  Never Used Psych involvement (Current and /or in the community):  No (Comment)  Discharge Needs  Concerns to be addressed:  Care Coordination Readmission within the last 30 days:  No Current discharge risk:  Dependent with Mobility, Lives alone Barriers to Discharge:  No Barriers Identified   Candie Chroman, LCSW 10/13/2015, 2:05 PM

## 2015-10-13 NOTE — NC FL2 (Signed)
[place] LEVEL OF CARE SCREENING TOOL     IDENTIFICATION  Patient Name: Paul Mullins Birthdate: Jan 03, 1951 Sex: male Admission Date (Current Location): 10/11/2015  [facility] and [place] Number:  Herbalist and Address:  [facility] [place] [address], [place], [place] [address]      Provider Number: [id number]  Attending Physician Name and Address:  Robbie Lis, MD  Relative Name and Phone Number:       Current Level of Care: Hospital Recommended Level of Care: [place] Prior Approval Number:    Date Approved/Denied:   PASRR Number: [id number]  Discharge Plan: SNF    Current Diagnoses: Patient Active Problem List   Diagnosis Date Noted  . Hypertensive urgency 10/11/2015  . HTN (hypertension), malignant 10/11/2015  . Status epilepticus ([place]) 10/11/2015  . AKI (acute kidney injury) ([place]) 10/11/2015  . CKD (chronic kidney disease) 10/11/2015  . 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 Pain in limb 09/04/2012  . Aftercare following surgery of the circulatory system, [place] 09/04/2012  . PVD (peripheral vascular disease) ([place]) 08/07/2012    Orientation RESPIRATION BLADDER Height & Weight     Self, Time, Situation, Place  Normal Incontinent, External catheter Weight: 169 lb 3.2 oz (76.749 kg) (bed scale with seizure pads) Height:  5\' 4"  (162.6 cm)  BEHAVIORAL SYMPTOMS/MOOD NEUROLOGICAL BOWEL NUTRITION STATUS   (None) Convulsions/Seizures (Status epilepticus) Continent Diet (Heart healthy)  AMBULATORY STATUS COMMUNICATION OF NEEDS Skin   Limited Assist Verbally Other (Comment) (Rash. Laceration medial head: Sutures.)                       Personal Care Assistance Level of Assistance  Bathing, Feeding, Dressing Bathing Assistance: Limited assistance Feeding assistance: Independent Dressing Assistance: Limited assistance     Functional Limitations Info   Sight, Hearing, Speech Sight Info: Adequate Hearing Info: Adequate Speech Info: Adequate    SPECIAL CARE FACTORS FREQUENCY  PT (By licensed PT), OT (By licensed OT), Blood pressure     PT Frequency: 5 x week OT Frequency: 5 x week            Contractures Contractures Info: Not present    Additional Factors Info  Code Status, Allergies Code Status Info: Full Allergies Info: Ace inhibitors           Current Medications (10/13/2015):  This is the current hospital active medication list Current Facility-Administered Medications  Medication Dose Route Frequency Provider Last Rate Last Dose  . 0.9 %  sodium chloride infusion  75 mL/hr Intravenous Continuous Maren Reamer, MD 75 mL/hr at 10/13/15 0107 75 mL/hr at 10/13/15 0107  . amLODipine (NORVASC) tablet 10 mg  10 mg Oral Daily Maren Reamer, MD   10 mg at 10/13/15 1032  . aspirin EC tablet 81 mg  81 mg Oral Daily Maren Reamer, MD   81 mg at 10/13/15 1032  . atorvastatin (LIPITOR) tablet 40 mg  40 mg Oral q1800 Maren Reamer, MD   40 mg at 10/12/15 1737  . cloNIDine (CATAPRES) tablet 0.2 mg  0.2 mg Oral BID Maren Reamer, MD   0.2 mg at 10/13/15 1032  . clopidogrel (PLAVIX) tablet 75 mg  75 mg Oral Daily Maren Reamer, MD   75 mg at 10/13/15 1032  . dexamethasone (DECADRON) injection 4 mg  4 mg Intravenous Q6H Robbie Lis, MD   4  mg at 10/13/15 0601  . gabapentin (NEURONTIN) capsule 300 mg  300 mg Oral TID Maren Reamer, MD   300 mg at 10/13/15 1032  . heparin injection 5,000 Units  5,000 Units Subcutaneous BID Maren Reamer, MD   5,000 Units at 10/13/15 1032  . hydrALAZINE (APRESOLINE) injection 10 mg  10 mg Intravenous Q6H PRN Maren Reamer, MD      . isosorbide mononitrate (IMDUR) 24 hr tablet 90 mg  90 mg Oral Daily Maren Reamer, MD   90 mg at 10/13/15 1032  . labetalol (NORMODYNE) tablet 200 mg  200 mg Oral BID Maren Reamer, MD   200 mg at 10/13/15 1032  . lacosamide (VIMPAT) 200 mg in  sodium chloride 0.9 % 25 mL IVPB  200 mg Intravenous Q12H Greta Doom, MD   200 mg at 10/13/15 0636  . levETIRAcetam (KEPPRA) 1,500 mg in sodium chloride 0.9 % 100 mL IVPB  1,500 mg Intravenous Q12H Maren Reamer, MD   1,500 mg at 10/13/15 0602  . LORazepam (ATIVAN) injection 1 mg  1 mg Intravenous Q15 min PRN Greta Doom, MD      . nitroGLYCERIN (NITROSTAT) SL tablet 0.4 mg  0.4 mg Sublingual Q5 min PRN Maren Reamer, MD      . pantoprazole (PROTONIX) EC tablet 40 mg  40 mg Oral Daily Maren Reamer, MD   40 mg at 10/13/15 1032     Discharge Medications: Please see discharge summary for a list of discharge medications.  Relevant Imaging Results:  Relevant Lab Results:   Additional Information SS#: [id number]  Candie Chroman, LCSW

## 2015-10-13 NOTE — Clinical Social Work Placement (Signed)
   CLINICAL SOCIAL WORK PLACEMENT  NOTE  Date:  10/13/2015  Patient Details  Name: Paul Mullins MRN: [id number] Date of Birth: 07/10/1950  Clinical Social Work is seeking post-discharge placement for this patient at the [place] level of care (*CSW will initial, date and re-position this form in  chart as items are completed):  Yes   Patient/family provided with [place] Work Department's list of facilities offering this level of care within the geographic area requested by the patient (or if unable, by the patient's family).  Yes   Patient/family informed of their freedom to choose among providers that offer the needed level of care, that participate in Medicare, Medicaid or managed care program needed by the patient, have an available bed and are willing to accept the patient.  Yes   Patient/family informed of [facility]'s ownership interest in [facility] and [facility], as well as of the fact that they are under no obligation to receive care at these facilities.  PASRR submitted to EDS on 10/13/15     PASRR number received on 10/13/15     Existing PASRR number confirmed on       FL2 transmitted to all facilities in geographic area requested by pt/family on 10/13/15     FL2 transmitted to all facilities within larger geographic area on       Patient informed that his/her managed care company has contracts with or will negotiate with certain facilities, including the following:            Patient/family informed of bed offers received.  Patient chooses bed at       Physician recommends and patient chooses bed at      Patient to be transferred to   on  .  Patient to be transferred to facility by       Patient family notified on   of transfer.  Name of family member notified:        PHYSICIAN       Additional Comment:    _______________________________________________ Candie Chroman, LCSW 10/13/2015, 2:21 PM

## 2015-10-13 NOTE — Progress Notes (Signed)
I will follow up with pt concerning possible inpt rehab admission pending bed availability when pt medically ready for d/c. [phone]

## 2015-10-14 DIAGNOSIS — R569 Unspecified convulsions: Secondary | ICD-10-CM

## 2015-10-14 LAB — CBC
HEMATOCRIT: 31.6 % — AB (ref 39.0–52.0)
HEMOGLOBIN: 10.1 g/dL — AB (ref 13.0–17.0)
MCH: 29.1 pg (ref 26.0–34.0)
MCHC: 32 g/dL (ref 30.0–36.0)
MCV: 91.1 fL (ref 78.0–100.0)
Platelets: 216 10*3/uL (ref 150–400)
RBC: 3.47 MIL/uL — ABNORMAL LOW (ref 4.22–5.81)
RDW: 13.8 % (ref 11.5–15.5)
WBC: 7.5 10*3/uL (ref 4.0–10.5)

## 2015-10-14 LAB — BASIC METABOLIC PANEL
Anion gap: 9 (ref 5–15)
BUN: 20 mg/dL (ref 6–20)
CALCIUM: 7.7 mg/dL — AB (ref 8.9–10.3)
CHLORIDE: 107 mmol/L (ref 101–111)
CO2: 23 mmol/L (ref 22–32)
CREATININE: 1.63 mg/dL — AB (ref 0.61–1.24)
GFR calc non Af Amer: 43 mL/min — ABNORMAL LOW (ref >60)
GFR, EST AFRICAN AMERICAN: 49 mL/min — AB (ref >60)
GLUCOSE: 121 mg/dL — AB (ref 65–99)
Potassium: 3.8 mmol/L (ref 3.5–5.1)
Sodium: 139 mmol/L (ref 135–145)

## 2015-10-14 MED ORDER — CLOBAZAM 10 MG PO TABS
20.0000 mg | ORAL_TABLET | Freq: Two times a day (BID) | ORAL | Status: DC
  Administered 2015-10-14 – 2015-10-20 (×12): 20 mg via ORAL
  Filled 2015-10-14 (×12): qty 2

## 2015-10-14 MED ORDER — SODIUM CHLORIDE 0.9 % IV SOLN
750.0000 mg | Freq: Two times a day (BID) | INTRAVENOUS | Status: DC
  Administered 2015-10-14 – 2015-10-15 (×2): 750 mg via INTRAVENOUS
  Filled 2015-10-14 (×4): qty 7.5

## 2015-10-14 MED ORDER — POLYETHYLENE GLYCOL 3350 17 G PO PACK
17.0000 g | PACK | Freq: Every day | ORAL | Status: DC
  Administered 2015-10-14 – 2015-10-20 (×5): 17 g via ORAL
  Filled 2015-10-14 (×6): qty 1

## 2015-10-14 NOTE — Care Management Important Message (Signed)
Important Message  Patient Details  Name: Paul Mullins MRN: [id number] Date of Birth: 03-07-1951   Medicare Important Message Given:  Yes    [name] 10/14/2015, 10:03 AM

## 2015-10-14 NOTE — Progress Notes (Signed)
Patient ID: Paul Mullins, male   DOB: 12-15-1950, 65 y.o.   MRN: [id number]  PROGRESS NOTE    Paul Mullins  [name] DOB: 04-21-1950 DOA: 10/11/2015  PCP: Alfonso Patten, MD   Brief Narrative:  14 -year-old male with past medical history of seizures, hypertension, coronary artery disease, chronic systolic CHF, status post right femoral-popliteal bypass in 2014. He presented [facility] the day prior to the admission for weakness and confusion but subsequently discharged home. He then presented back to the hospital because of complains of distal right leg shaking and worsening weakness with fall. He was seen by neurology in consultation. They recommended Keppra in addition to Vimpat. MRI with and without contrast showed white matter edema, concerning for neoplasm.   Assessment & Plan:   Principal Problem: Acute encephalopathy - Probably related to white matter edema, possible malignancy as noted on MRI of the brain with and without contrast - no clear mass noted on MRI, Appreciate Neurosurgery consult - improving - needs FU MRI in few weeks - PT evaluation, CIR consult appreciated  Seizures / questionable brain malignancy  - Continue Keppra/Vimpat -started on  Clobazam per Neurology - 24h EEG pending -see above regarding NSG consult -malignancy workup  Essential hypertension - Continue Norvasc 10 mg daily, clonidine 0.2 mg twice daily, isosorbide 90 mg daily, labetalol 200 mg twice daily  Dyslipidemia - Continue Lipitor 40 mg daily  Coronary artery disease, native artery without angina pectoris - Continue aspirin and Plavix  Chronic kidney disease stage III - Baseline creatinine 1.4 few months prior to this admission - Creatinine 2.05 on this admission and further up to 2.13 but fortunately improving to 1.93 this am  - Not on any nephrotoxic medications -improving  Anemia of chronic kidney disease  - Hemoglobin stable at 10.1  DVT prophylaxis:  Heparin subQ  Code Status: full code  Family Communication: no family at the bedside this am Disposition Plan: CIR vs SNF when cleared by Neurology   Consultants:   Neurology   Neurosurgery  Procedures:   EEG 6/26 - pending   Antimicrobials:   None    Subjective: Feels better.   Objective: Filed Vitals:   10/13/15 2042 10/14/15 0621 10/14/15 1048 10/14/15 1218  BP: 148/63 185/78 155/68 150/72  Pulse: 81 72 74 75  Temp: 98.4 F (36.9 C) 98.4 F (36.9 C) 98.2 F (36.8 C) 98 F (36.7 C)  TempSrc: Oral Oral Oral Oral  Resp: 18 20  20   Height:      Weight:  78.518 kg (173 lb 1.6 oz)    SpO2: 99% 100% 100% 97%    Intake/Output Summary (Last 24 hours) at 10/14/15 1429 Last data filed at 10/14/15 1350  Gross per 24 hour  Intake    820 ml  Output   2850 ml  Net  -2030 ml   Filed Weights   10/12/15 0523 10/13/15 0300 10/14/15 0621  Weight: 73.982 kg (163 lb 1.6 oz) 76.749 kg (169 lb 3.2 oz) 78.518 kg (173 lb 1.6 oz)    Examination:  General exam: Appears calm and comfortable  Respiratory system: Clear to auscultation. Respiratory effort normal. Cardiovascular system: S1 & S2 heard, RRR. No JVD, murmurs, rubs, gallops or clicks. No pedal edema. Gastrointestinal system: Abdomen is nondistended, soft and nontender. No organomegaly or masses felt. Normal bowel sounds heard. Central nervous system: Alert and oriented. No focal neurological deficits. Extremities: Symmetric 5 x 5 power. Skin: No rashes, lesions or ulcers Psychiatry:  Judgement and insight appear normal. Mood & affect appropriate.   Data Reviewed: I have personally reviewed following labs and imaging studies  CBC:  Recent Labs Lab 10/11/15 1127 10/11/15 1653 10/12/15 0541 10/13/15 0456 10/14/15 0550  WBC 5.6 5.0 4.8 4.0 7.5  NEUTROABS 2.6  --  1.7  --   --   HGB 12.8* 12.8* 11.0* 10.1* 10.1*  HCT 39.1 39.2 34.9* 32.1* 31.6*  MCV 90.3 91.0 92.3 92.0 91.1  PLT 222 219 197 217 [id number]   Basic  Metabolic Panel:  Recent Labs Lab 10/11/15 1127 10/11/15 1653 10/12/15 0541 10/13/15 0456 10/14/15 0550  NA 138  --  139 136 139  K 4.5  --  3.9 4.1 3.8  CL 105  --  110 106 107  CO2 24  --  21* 23 23  GLUCOSE 96  --  106* 139* 121*  BUN 20  --  28* 24* 20  CREATININE 2.00* 2.05* 2.13* 1.93* 1.63*  CALCIUM 9.0  --  7.9* 7.7* 7.7*   GFR: Estimated Creatinine Clearance: 42.8 mL/min (by C-G formula based on Cr of 1.63). Liver Function Tests:  Recent Labs Lab 10/11/15 1127  AST 36  ALT 22  ALKPHOS 56  BILITOT 1.1  PROT 7.8  ALBUMIN 3.4*   No results for input(s): LIPASE, AMYLASE in the last 168 hours. No results for input(s): AMMONIA in the last 168 hours. Coagulation Profile: No results for input(s): INR, PROTIME in the last 168 hours. Cardiac Enzymes: No results for input(s): CKTOTAL, CKMB, CKMBINDEX, TROPONINI in the last 168 hours. BNP (last 3 results) No results for input(s): PROBNP in the last 8760 hours. HbA1C:  Recent Labs  10/11/15 1740  HGBA1C 5.5   CBG: No results for input(s): GLUCAP in the last 168 hours. Lipid Profile:  Recent Labs  10/12/15 0541  CHOL 129  HDL 34*  LDLCALC 69  TRIG 132  CHOLHDL 3.8   Thyroid Function Tests: No results for input(s): TSH, T4TOTAL, FREET4, T3FREE, THYROIDAB in the last 72 hours. Anemia Panel: No results for input(s): VITAMINB12, FOLATE, FERRITIN, TIBC, IRON, RETICCTPCT in the last 72 hours. Urine analysis:    Component Value Date/Time   COLORURINE YELLOW 08/08/2012 [place] 08/08/2012 1258   LABSPEC 1.011 08/08/2012 1258   PHURINE 7.0 08/08/2012 1258   GLUCOSEU NEGATIVE 08/08/2012 1258   HGBUR NEGATIVE 08/08/2012 1258   BILIRUBINUR NEGATIVE 08/08/2012 1258   KETONESUR NEGATIVE 08/08/2012 1258   PROTEINUR NEGATIVE 08/08/2012 1258   UROBILINOGEN 0.2 08/08/2012 1258   NITRITE NEGATIVE 08/08/2012 1258   LEUKOCYTESUR NEGATIVE 08/08/2012 1258   Sepsis  Labs: @LABRCNTIP (procalcitonin:4,lacticidven:4)   )No results found for this or any previous visit (from the past 240 hour(s)).    Radiology Studies: Mr Jeri Cos [facility] Contrast 10/11/2015  1. Mild further enlargement of the region of left parietal signal abnormality with small amount of petechial enhancement. 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 Subacute infarct would be expected to have shown noticeable improvement in this timeframe, even if there had been some additional ischemia in the interim. Cerebritis could also give this appearance though does not fit the clinical presentation. 2. Mild left parietal cortical diffusion abnormality likely reflects recent seizure activity.   Dg Chest Portable 1 View 10/11/2015 No acute abnormalities. Electronically Signed   By: Lavonia Dana M.D.   On: 10/11/2015 12:57  Scheduled Meds: . amLODipine  10 mg Oral Daily  . aspirin EC  81 mg Oral Daily  . atorvastatin  40 mg Oral q1800  . cloBAZam  20 mg Oral BID  . cloNIDine  0.2 mg Oral BID  . clopidogrel  75 mg Oral Daily  . gabapentin  300 mg Oral TID  . heparin  5,000 Units Subcutaneous BID  . isosorbide mononitrate  90 mg Oral Daily  . labetalol  200 mg Oral BID  . lacosamide (VIMPAT) IV  200 mg Intravenous Q12H  . levETIRAcetam  750 mg Intravenous Q12H  . pantoprazole  40 mg Oral Daily  . polyethylene glycol  17 g Oral Daily   Continuous Infusions:     LOS: 3 days    Time spent: 35 minutes Greater than 50% of the time spent on counseling and coordinating the care.   Domenic Polite, MD [facility] Pager [phone]  If 7PM-7AM, please contact night-coverage [email or web address] Password [facility] 10/14/2015, 2:29 PM

## 2015-10-14 NOTE — Consult Note (Signed)
Reason for Consult:brain lesion Referring Physician: Ahmarion Mullins is an 65 y.o. male.  HPI: Paul Mullins is a 65 y.o. male with history of seizures, as well as hypertension, cad, ventral septal defect, chronic systolic chf, Likely ckd per records, prior history of severe claudication and rest ischemia status post right femoral popliteal bypass in 2014, was discharged yesterday from [facility] secondary to weakness and confusion. He complains of distal right leg shaking with increase weakness w/ fall a few days ago for which he was seen at [facility]. They sutured his head laceration, then discharged him to home yesterday. He went home with his leg still "twitching". He comes to our ED for further evaluation of the Leg twitching. Patient used to be on Keppra but he ran out of his medicine a few days ago, and several other medications he could not name. He states he is hungry. Denies sp/sob/doe.  He has been admitted to the Medicine Service and his seizures have been better controlled.  MRI shows left frontal lesion, which appears to be enlarging from a scan one month prior, but is diffuse and not enhancing, mainly evident on T 2 weighted images.    Past Medical History  Diagnosis Date  . Hypertension   . Anginal pain ([place])   . Peripheral vascular disease ([place])   . Chronic systolic CHF (congestive heart failure) [facility])     [facility]; Dr. Rich Reining  . Coronary artery disease     08/08/12 - patient denied CAD, but is followed for CHF  . 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 Heart murmur     "since birth"; small membraneous VSD by 01/2012 echo    Past Surgical History  Procedure Laterality Date  . Neck surgery    . Neck surgery    . Stones    . Gallstones    . Femoral-popliteal bypass graft Right 08/17/2012    Procedure: BYPASS GRAFT FEMORAL-POPLITEAL ARTERY;   Surgeon: Mal Misty, MD;  Location: [place];  Service: Vascular;  Laterality: Right;  using non reversed Sapphenous vein with intraoperative arteriogram.  . Abdominal aortagram N/A 07/05/2012    Procedure: ABDOMINAL Maxcine Ham;  Surgeon: Conrad Waiohinu, MD;  Location: [facility] CATH LAB;  Service: Cardiovascular;  Laterality: N/A;    No family history on file.  Social History:  reports that he quit smoking about 3 years ago. His smoking use included Cigarettes. He has never used smokeless tobacco. He reports that he does not drink alcohol or use illicit drugs.  Allergies:  Allergies  Allergen Reactions  . Ace Inhibitors Other (See Comments)    Impaired  Kidney Function    Medications: I have reviewed the patient's current medications.  Results for orders placed or performed during the hospital encounter of 10/11/15 (from the past 48 hour(s))  CBC with Differential/Platelet     Status: Abnormal   Collection Time: 10/12/15  5:41 AM  Result Value Ref Range   WBC 4.8 4.0 - 10.5 K/uL   RBC 3.78 (L) 4.22 - 5.81 MIL/uL   Hemoglobin 11.0 (L) 13.0 - 17.0 g/dL   HCT 34.9 (L) 39.0 - 52.0 %   MCV 92.3 78.0 - 100.0 fL   MCH 29.1 26.0 - 34.0 pg   MCHC 31.5 30.0 - 36.0 g/dL   RDW 13.8 11.5 - 15.5 %   Platelets 197 150 - 400 K/uL  Neutrophils Relative % 36 %   Neutro Abs 1.7 1.7 - 7.7 K/uL   Lymphocytes Relative 35 %   Lymphs Abs 1.7 0.7 - 4.0 K/uL   Monocytes Relative 6 %   Monocytes Absolute 0.3 0.1 - 1.0 K/uL   Eosinophils Relative 23 %   Eosinophils Absolute 1.1 (H) 0.0 - 0.7 K/uL   Basophils Relative 0 %   Basophils Absolute 0.0 0.0 - 0.1 K/uL  Basic metabolic panel     Status: Abnormal   Collection Time: 10/12/15  5:41 AM  Result Value Ref Range   Sodium 139 135 - 145 mmol/L   Potassium 3.9 3.5 - 5.1 mmol/L   Chloride 110 101 - 111 mmol/L   CO2 21 (L) 22 - 32 mmol/L   Glucose, Bld 106 (H) 65 - 99 mg/dL   BUN 28 (H) 6 - 20 mg/dL   Creatinine, Ser 2.13 (H) 0.61 - 1.24 mg/dL   Calcium  7.9 (L) 8.9 - 10.3 mg/dL   GFR calc non Af Amer 31 (L) >60 mL/min   GFR calc Af Amer 36 (L) >60 mL/min    Comment: (NOTE) The eGFR has been calculated using the CKD EPI equation. This calculation has not been validated in all clinical situations. eGFR's persistently <60 mL/min signify possible Chronic Kidney Disease.    Anion gap 8 5 - 15  Lipid panel     Status: Abnormal   Collection Time: 10/12/15  5:41 AM  Result Value Ref Range   Cholesterol 129 0 - 200 mg/dL   Triglycerides 132 <150 mg/dL   HDL 34 (L) >40 mg/dL   Total CHOL/HDL Ratio 3.8 RATIO   VLDL 26 0 - 40 mg/dL   LDL Cholesterol 69 0 - 99 mg/dL    Comment:        Total Cholesterol/HDL:CHD Risk Coronary Heart Disease Risk Table                     Men   Women  1/2 Average Risk   3.4   3.3  Average Risk       5.0   4.4  2 X Average Risk   9.6   7.1  3 X Average Risk  23.4   11.0        Use the calculated Patient Ratio above and the CHD Risk Table to determine the patient's CHD Risk.        ATP III CLASSIFICATION (LDL):  <100     mg/dL   Optimal  100-129  mg/dL   Near or Above                    Optimal  130-159  mg/dL   Borderline  160-189  mg/dL   High  >190     mg/dL   Very High   CBC     Status: Abnormal   Collection Time: 10/13/15  4:56 AM  Result Value Ref Range   WBC 4.0 4.0 - 10.5 K/uL   RBC 3.49 (L) 4.22 - 5.81 MIL/uL   Hemoglobin 10.1 (L) 13.0 - 17.0 g/dL   HCT 32.1 (L) 39.0 - 52.0 %   MCV 92.0 78.0 - 100.0 fL   MCH 28.9 26.0 - 34.0 pg   MCHC 31.5 30.0 - 36.0 g/dL   RDW 13.8 11.5 - 15.5 %   Platelets 217 150 - 400 K/uL  Basic metabolic panel     Status: Abnormal   Collection Time: 10/13/15  4:56 AM  Result Value Ref Range   Sodium 136 135 - 145 mmol/L   Potassium 4.1 3.5 - 5.1 mmol/L   Chloride 106 101 - 111 mmol/L   CO2 23 22 - 32 mmol/L   Glucose, Bld 139 (H) 65 - 99 mg/dL   BUN 24 (H) 6 - 20 mg/dL   Creatinine, Ser 1.93 (H) 0.61 - 1.24 mg/dL   Calcium 7.7 (L) 8.9 - 10.3 mg/dL   GFR  calc non Af Amer 35 (L) >60 mL/min   GFR calc Af Amer 40 (L) >60 mL/min    Comment: (NOTE) The eGFR has been calculated using the CKD EPI equation. This calculation has not been validated in all clinical situations. eGFR's persistently <60 mL/min signify possible Chronic Kidney Disease.    Anion gap 7 5 - 15    No results found.  Review of Systems - Negative except as above    Blood pressure 148/63, pulse 81, temperature 98.4 F (36.9 C), temperature source Oral, resp. rate 18, height 5' 4"  (1.626 m), weight 76.749 kg (169 lb 3.2 oz), SpO2 99 %. Physical Exam  Constitutional: He is oriented to person, place, and time. He appears well-developed and well-nourished.  HENT:  Head: Normocephalic and atraumatic.  Eyes: EOM are normal. Pupils are equal, round, and reactive to light.  Neck: Normal range of motion. Neck supple.  Cardiovascular: Normal rate and regular rhythm.   Respiratory: Effort normal and breath sounds normal.  Musculoskeletal: Normal range of motion.  Neurological: He is alert and oriented to person, place, and time. He has normal reflexes. No cranial nerve deficit or sensory deficit. GCS eye subscore is 4. GCS verbal subscore is 5. GCS motor subscore is 6.  Mild right hemiparesis with pronator drift.  Patient denies headache.  Psychiatric: He has a normal mood and affect. His speech is normal and behavior is normal.    Assessment/Plan: Patient has seizures and abnormal brain MRI with lesion suggestive of neoplasm.  I have recommeded control of seizures, metastatic workup, possibly repeating MRI with possible biopsy depending on results of other imaging.  Peggyann Shoals, MD 10/14/2015, 1:20 AM

## 2015-10-14 NOTE — Progress Notes (Signed)
Physical Therapy Treatment Patient Details Name: Paul Mullins MRN: [id number] DOB: [birth year] Today's Date: 10/14/2015    History of Present Illness Pt is a 64 y/o M who presented w/ c/o distal Rt LE shaking and worsening weakness w/ fall.  MRI showed white matter edema, concerning for neoplasm.  Of note, pt ran out of his Keppra a few days prior to admission.  Pt's PMH includes neck surgery, Rt fem-pop bypass, PVD, CHF.    PT Comments    Paul Mullins made good progress today, ambulating 8 ft w/ RW and min assist to steady.  Pt continues to demonstrate impaired coordination; however, less severe today and able to participate safely in physical activities documented below.  Therapeutic exercises completed at end of session w/ focus on coordination of Bil LEs and UEs.  Pt is very motivated for OOB activity.  Pt will benefit from continued skilled PT services to increase functional independence and safety.   Follow Up Recommendations  CIR;Supervision for mobility/OOB     Equipment Recommendations  Other (comment) (TBD at next venue of care)    Recommendations for Other Services OT consult;Rehab consult     Precautions / Restrictions Precautions Precautions: Fall;Other (comment) Precaution Comments: Continuous EEG; Seizure Restrictions Weight Bearing Restrictions: No    Mobility  Bed Mobility Overal bed mobility: Needs Assistance Bed Mobility: Supine to Sit     Supine to sit: Min guard;HOB elevated     General bed mobility comments: Poor coordination, requring increased time and cues.    Transfers Overall transfer level: Needs assistance Equipment used: Rolling walker (2 wheeled) Transfers: Sit to/from [facility] Sit to Stand: Min assist;+2 safety/equipment;+2 physical assistance Stand pivot transfers: Min assist       General transfer comment: Assist to steady and cues for hand placement. Better functional activation of Rt quad today w/ less clonic  activity.  Ambulation/Gait Ambulation/Gait assistance: Min assist;+2 safety/equipment Ambulation Distance (Feet): 8 Feet Assistive device: Rolling walker (2 wheeled) Gait Pattern/deviations: Ataxic;Decreased stride length;Staggering right;Staggering left;Antalgic   Gait velocity interpretation: Below normal speed for age/gender General Gait Details: Ataxic and unsteady requiring min assist to steady.  Cues for upright posture and forward gaze.     Stairs            Wheelchair Mobility    Modified Rankin (Stroke Patients Only)       Balance Overall balance assessment: Needs assistance Sitting-balance support: No upper extremity supported;Feet supported Sitting balance-Leahy Scale: Fair Sitting balance - Comments: Close min guard for pt's safety   Standing balance support: Bilateral upper extremity supported;During functional activity Standing balance-Leahy Scale: Poor Standing balance comment: Relies on RW and physical assist for support                    Cognition Arousal/Alertness: Awake/alert Behavior During Therapy: WFL for tasks assessed/performed Overall Cognitive Status: No family/caregiver present to determine baseline cognitive functioning                      Exercises General Exercises - Lower Extremity Ankle Circles/Pumps: AROM;Both;10 reps;Supine Straight Leg Raises: AROM;Both;5 reps;Supine Other Exercises Other Exercises: Moving target coordination exercise for Bil LEs and UEs while pt sitting in recliner chair.    General Comments        Pertinent Vitals/Pain Pain Assessment: No/denies pain    Home Living                      Prior  Function            PT Goals (current goals can now be found in the care plan section) Acute Rehab PT Goals Patient Stated Goal: "I need to get out of this bed" PT Goal Formulation: With patient Time For Goal Achievement: 10/26/15 Potential to Achieve Goals: Good Progress towards PT  goals: Progressing toward goals    Frequency  Min 3X/week    PT Plan Current plan remains appropriate    Co-evaluation             End of Session Equipment Utilized During Treatment: Gait belt Activity Tolerance: Patient tolerated treatment well Patient left: in chair;with call bell/phone within reach;with chair alarm set     Time: [id number] PT Time Calculation (min) (ACUTE ONLY): 18 min  Charges:  $Therapeutic Exercise: 8-22 mins                    G Codes:      Collie Siad PT, DPT  Pager: [phone] Phone: [phone] 10/14/2015, 5:13 PM

## 2015-10-14 NOTE — Progress Notes (Signed)
1530 Report received from  7am-7p RN > pt with EEG electrodes attached tto skull well secured in place . Camera eeg  Focused to pt while siitng on chair . Pt fully awake alert and oriented . Coherent . No complaint presnted

## 2015-10-14 NOTE — Progress Notes (Signed)
LTM EEG maint complete. No skin breakdown. Will continue to monitor

## 2015-10-14 NOTE — Progress Notes (Signed)
Subjective: States his right leg is not jerking as much, just has tapping of his foot now.  Exam: Filed Vitals:   10/13/15 2042 10/14/15 0621  BP: 148/63 185/78  Pulse: 81 72  Temp: 98.4 F (36.9 C) 98.4 F (36.9 C)  Resp: 18 20    HEENT-  Normocephalic, no lesions, without obvious abnormality.  Normal external eye.  Normal TM's bilaterally.  Normal external ears. Normal external nose.  Normal pharynx. Poor dentition. Cardiovascular- S1, S2 normal, pulses palpable throughout   Lungs- chest clear, no wheezing, rales, normal symmetric air entry Abdomen- soft, non-tender; bowel sounds normal; no masses,  no organomegaly Extremities- less then 2 second capillary refill and no edema Lymph-no adenopathy palpable Musculoskeletal-no joint tenderness, deformity or swelling Skin-dry    Gen: In bed, NAD, finishing breakfast MS: Awake, alert, oriented CN: II-XII intact Motor:Tone is normal. Bulk is normal. 5/5 strength bilateral upper extremities, Left leg 5/5 strength, Right leg 4/5 strength. Continuous myoclonic beating of Right foot Sensory: Sensory intact, subjectively diminished RLE   Pertinent Labs/Diagnostics: Cr 1.63, trending down  April Pugh, MSN, RN Neurology [phone]  Impression:  65 yo male with a history of seizures, ran out of home regimen of Keppra prior to hospitalization. He then began having shaking of his right leg and presented to [facility]. His family states that his right leg has been jerking nonstop since his initial presentation on 6/21. Started on Onfi 10mg  po yesterday and placed on continuous EEG.  Shaking of right leg has improved to myoclonic beating of right foot. Will increase Onfi to 20mg  po bid, and decrease Keppra to 750mg  IV bid due to poor renal function.  Review of EEG prior to official read suggests   Recommendations: 1)Increase Onfi 20mg  po bid 2) Decrease Keppra to 750mg  IV bid due to renal clearance  3) continuee EEG monitoring     10/14/2015, 10:05 AM   Seen and examined with NP. Agree with assessment and plan

## 2015-10-14 NOTE — Procedures (Signed)
Continuous Video-EEG Monitoring Report  Patient: Paul Mullins, [name]     EEG No.ID: [id number]     DOB: 1950-05-15 Age: 65 Room#3E 39  MED REC NO: [id number] Gender: Male   TECH: [place]     Physician: Winfield Cunas     Referring Physician: Lunette Stands     Report Date: 10/14/2015      Study Duration: 10/13/2015 19:22 to 10/14/2015 07:30 CPT Code:  [id number] Diagnosis:  Seizures (R56.9)  History: This is a 65 year old male presenting with seizures.  Video-EEG monitoring was performed to evaluate for seizures.  Technical Details:  Long-term video-EEG monitoring was performed using standard setting per the guidelines.  Briefly, a minimum of 21 electrodes were placed on scalp according to the International 10-20 or/and 10-10 Systems.  Supplemental electrodes were placed as needed.  Single EKG electrode was also used to detect cardiac arrhythmia.  Patient's behavior was continuously recorded on video simultaneously with EEG.  A minimum of 16 channels were used for data display.  Each epoch of study was reviewed manually daily and as needed using standard referential and bipolar montages.    EEG Description:  Diffuse beta activity was present, likely due to medication effect (such as benzodiazepine).  No clear posterior dominant rhythm was seen during wakefulness.  Intermittent generalized polymorphic delta slowing was present.  No focal, lateralized or periodic abnormality was seen.  No epileptiform discharges or seizures were in evidence.    Impression:  This is an abnormal EEG due to the presence of intermittent generalized polymorphic delta slowing, suggesting mild to moderate encephalopathy, but medication effect (such as benzodiazepine) could not be excluded.  Absence of interictal epileptiform discharges does not rule out a diagnosis of epilepsy.             [place] Physician: Winfield Cunas, MD, PhD

## 2015-10-14 NOTE — Progress Notes (Signed)
We reviewed MRI's as far back as March, 2016.  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  We would recommend stereotactic craniotomy and biopsy to determine diagnosis.  Metastatic workup is reasonable to pursue, but is most likely to be negative.

## 2015-10-14 NOTE — Clinical Social Work Note (Signed)
CSW provided patient with list of bed offers. Will discuss choice likely tomorrow.  [name], [place]

## 2015-10-15 DIAGNOSIS — D62 Acute posthemorrhagic anemia: Secondary | ICD-10-CM

## 2015-10-15 DIAGNOSIS — I5022 Chronic systolic (congestive) heart failure: Secondary | ICD-10-CM

## 2015-10-15 DIAGNOSIS — I160 Hypertensive urgency: Secondary | ICD-10-CM

## 2015-10-15 DIAGNOSIS — N179 Acute kidney failure, unspecified: Secondary | ICD-10-CM

## 2015-10-15 LAB — BASIC METABOLIC PANEL
Anion gap: 6 (ref 5–15)
BUN: 19 mg/dL (ref 6–20)
CALCIUM: 7.4 mg/dL — AB (ref 8.9–10.3)
CO2: 24 mmol/L (ref 22–32)
CREATININE: 1.79 mg/dL — AB (ref 0.61–1.24)
Chloride: 107 mmol/L (ref 101–111)
GFR, EST AFRICAN AMERICAN: 44 mL/min — AB (ref >60)
GFR, EST NON AFRICAN AMERICAN: 38 mL/min — AB (ref >60)
Glucose, Bld: 111 mg/dL — ABNORMAL HIGH (ref 65–99)
Potassium: 3.6 mmol/L (ref 3.5–5.1)
SODIUM: 137 mmol/L (ref 135–145)

## 2015-10-15 LAB — CBC
HEMATOCRIT: 32.7 % — AB (ref 39.0–52.0)
Hemoglobin: 10.3 g/dL — ABNORMAL LOW (ref 13.0–17.0)
MCH: 29.9 pg (ref 26.0–34.0)
MCHC: 31.5 g/dL (ref 30.0–36.0)
MCV: 95.1 fL (ref 78.0–100.0)
PLATELETS: 227 10*3/uL (ref 150–400)
RBC: 3.44 MIL/uL — ABNORMAL LOW (ref 4.22–5.81)
RDW: 13.9 % (ref 11.5–15.5)
WBC: 5.7 10*3/uL (ref 4.0–10.5)

## 2015-10-15 MED ORDER — LEVETIRACETAM 750 MG PO TABS
750.0000 mg | ORAL_TABLET | Freq: Two times a day (BID) | ORAL | Status: DC
  Administered 2015-10-15 – 2015-10-20 (×10): 750 mg via ORAL
  Filled 2015-10-15 (×12): qty 1

## 2015-10-15 NOTE — Progress Notes (Signed)
Short note:  -Patient tolerated increase of Onfi and decrease in Keppra to his renal dose.Please continue current doses.   -EEG only shows slowing and no seizures. Still having mild myoclonus in his R toes, mild and he is not bothered by this. Will d/c EEG.  Please call back with any questions

## 2015-10-15 NOTE — Progress Notes (Signed)
Patient ID: Paul Mullins, male   DOB: [birth year], 65 y.o.   MRN: [id number]  PROGRESS NOTE    Paul Mullins  Paul Mullins DOB: 08-Jun-1950 DOA: 10/11/2015  PCP: Paul Patten, MD   Brief Narrative:  65 -year-old male with past medical history of seizures, hypertension, coronary artery disease, chronic systolic CHF, status post right femoral-popliteal bypass in 2014. He presented [facility] the day prior to the admission for weakness and confusion but subsequently discharged home. He then presented back to the hospital because of complains of distal right leg shaking and worsening weakness with fall. He was seen by neurology in consultation. They recommended Keppra in addition to Vimpat. MRI with and without contrast showed white matter edema, concerning for neoplasm.   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  - Continue Keppra/Vimpat -started on  Clobazam per Neurology, meds being titrated - 24h EEG pending -see above regarding NSG consult -if repeat MRI concerning for mass vs mets will need malignancy workup then  Essential hypertension - Continue Norvasc 10 mg daily, clonidine 0.2 mg twice daily, isosorbide 90 mg daily, labetalol 200 mg twice daily  Dyslipidemia - Continue Lipitor 40 mg daily  Coronary artery disease, native artery without angina pectoris - Continue aspirin and Plavix  Chronic kidney disease stage III - Baseline creatinine 1.4 few months prior to this admission - Creatinine 2.05 on this admission and further up to 2.13 but fortunately improving to 1.93 this am  - Not on any nephrotoxic medications - now in  1.6-1.7range  Anemia of chronic kidney disease  - Hemoglobin stable  DVT prophylaxis: Heparin subQ  Code Status: full code  Family Communication: no family at the bedside this am Disposition Plan: CIR vs SNF when cleared by Neurology, ? tomorrow   Consultants:   Neurology   Neurosurgery  Procedures:   EEG 6/26 - pending   Antimicrobials:   None    Subjective: Feels better.   Objective: Filed Vitals:   10/14/15 2112 10/15/15 0445 10/15/15 1000 10/15/15 1156  BP: 139/64 125/65 133/65 142/71  Pulse: 74 74 90 84  Temp: 98.1 F (36.7 C) 98.1 F (36.7 C) 98.1 F (36.7 C) 98.9 F (37.2 C)  TempSrc: Oral Oral Oral Oral  Resp: 19 18  16   Height:      Weight:      SpO2: 100% 100% 100% 99%    Intake/Output Summary (Last 24 hours) at 10/15/15 1250 Last data filed at 10/15/15 0550  Gross per 24 hour  Intake    720 ml  Output    701 ml  Net     19 ml   Filed Weights   10/12/15 0523 10/13/15 0300 10/14/15 0621  Weight: 73.982 kg (163 lb 1.6 oz) 76.749 kg (169 lb 3.2 oz) 78.518 kg (173 lb 1.6 oz)    Examination:  General exam: Appears calm and comfortable  Respiratory system: Clear to auscultation. Respiratory effort normal. Cardiovascular system: S1 & S2 heard, RRR. No JVD, murmurs, rubs, gallops or clicks. No pedal edema. Gastrointestinal system: Abdomen is nondistended, soft and nontender. No organomegaly or masses felt. Normal bowel sounds heard. Central nervous system: Alert and oriented. No focal neurological deficits. Extremities: Symmetric  5 x 5 power. Skin: No rashes, lesions or ulcers Psychiatry: Judgement and insight appear normal. Mood & affect appropriate.   Data Reviewed: I have personally reviewed following labs and imaging studies  CBC:  Recent Labs Lab 10/11/15 1127 10/11/15 1653 10/12/15 0541 10/13/15 0456 10/14/15 0550 10/15/15 0254  WBC 5.6 5.0 4.8 4.0 7.5 5.7  NEUTROABS 2.6  --  1.7  --   --   --   HGB 12.8* 12.8* 11.0* 10.1*  10.1* 10.3*  HCT 39.1 39.2 34.9* 32.1* 31.6* 32.7*  MCV 90.3 91.0 92.3 92.0 91.1 95.1  PLT 222 219 197 217 216 [id number]   Basic Metabolic Panel:  Recent Labs Lab 10/11/15 1127 10/11/15 1653 10/12/15 0541 10/13/15 0456 10/14/15 0550 10/15/15 0254  NA 138  --  139 136 139 137  K 4.5  --  3.9 4.1 3.8 3.6  CL 105  --  110 106 107 107  CO2 24  --  21* 23 23 24   GLUCOSE 96  --  106* 139* 121* 111*  BUN 20  --  28* 24* 20 19  CREATININE 2.00* 2.05* 2.13* 1.93* 1.63* 1.79*  CALCIUM 9.0  --  7.9* 7.7* 7.7* 7.4*   GFR: Estimated Creatinine Clearance: 38.9 mL/min (by C-G formula based on Cr of 1.79). Liver Function Tests:  Recent Labs Lab 10/11/15 1127  AST 36  ALT 22  ALKPHOS 56  BILITOT 1.1  PROT 7.8  ALBUMIN 3.4*   No results for input(s): LIPASE, AMYLASE in the last 168 hours. No results for input(s): AMMONIA in the last 168 hours. Coagulation Profile: No results for input(s): INR, PROTIME in the last 168 hours. Cardiac Enzymes: No results for input(s): CKTOTAL, CKMB, CKMBINDEX, TROPONINI in the last 168 hours. BNP (last 3 results) No results for input(s): PROBNP in the last 8760 hours. HbA1C: No results for input(s): HGBA1C in the last 72 hours. CBG: No results for input(s): GLUCAP in the last 168 hours. Lipid Profile: No results for input(s): CHOL, HDL, LDLCALC, TRIG, CHOLHDL, LDLDIRECT in the last 72 hours. Thyroid Function Tests: No results for input(s): TSH, T4TOTAL, FREET4, T3FREE, THYROIDAB in the last 72 hours. Anemia Panel: No results for input(s): VITAMINB12, FOLATE, FERRITIN, TIBC, IRON, RETICCTPCT in the last 72 hours. Urine analysis:    Component Value Date/Time   COLORURINE YELLOW 08/08/2012 [place] 08/08/2012 1258   LABSPEC 1.011 08/08/2012 1258   PHURINE 7.0 08/08/2012 1258   GLUCOSEU NEGATIVE 08/08/2012 1258   HGBUR NEGATIVE 08/08/2012 1258   BILIRUBINUR NEGATIVE 08/08/2012 1258   KETONESUR NEGATIVE 08/08/2012 1258   PROTEINUR  NEGATIVE 08/08/2012 1258   UROBILINOGEN 0.2 08/08/2012 1258   NITRITE NEGATIVE 08/08/2012 1258   LEUKOCYTESUR NEGATIVE 08/08/2012 1258   Sepsis Labs: @LABRCNTIP (procalcitonin:4,lacticidven:4)   )No results found for this or any previous visit (from the past 240 hour(s)).    Radiology Studies: Mr Jeri Cos [facility] Contrast 10/11/2015  1. Mild further enlargement of the region of left parietal signal abnormality with small amount of petechial enhancement. 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 Subacute infarct would be expected to have shown noticeable improvement in this timeframe, even if there had been some additional ischemia in the interim. Cerebritis could also give this appearance though does not fit the clinical presentation. 2. Mild left parietal cortical diffusion abnormality likely reflects recent seizure activity.   Dg Chest  Portable 1 View 10/11/2015 No acute abnormalities. Electronically Signed   By: Lavonia Dana M.D.   On: 10/11/2015 12:57      Scheduled Meds: . amLODipine  10 mg Oral Daily  . aspirin EC  81 mg Oral Daily  . atorvastatin  40 mg Oral q1800  . cloBAZam  20 mg Oral BID  . cloNIDine  0.2 mg Oral BID  . clopidogrel  75 mg Oral Daily  . gabapentin  300 mg Oral TID  . heparin  5,000 Units Subcutaneous BID  . isosorbide mononitrate  90 mg Oral Daily  . labetalol  200 mg Oral BID  . lacosamide (VIMPAT) IV  200 mg Intravenous Q12H  . levETIRAcetam  750 mg Intravenous Q12H  . pantoprazole  40 mg Oral Daily  . polyethylene glycol  17 g Oral Daily   Continuous Infusions:     LOS: 4 days    Time spent: 35 minutes Greater than 50% of the time spent on counseling and coordinating the care.   Domenic Polite, MD [facility] Pager [phone]  If 7PM-7AM, please contact night-coverage [email or web address] Password [facility] 10/15/2015, 12:50 PM

## 2015-10-15 NOTE — Clinical Social Work Note (Addendum)
CSW sent message to [facility] admissions coordinator asking about disposition plan. Patient has chosen [facility] if he goes to SNF due to proximity to home. He lives in [place]. He plans to move in with his sister-in-law pending discharge from rehab.  [name], CSW [phone]  10:43 am CIR will reassess patient today after 1:00 to determine eligibility but feel that overall he will need SNF due to condition. CSW will have SNF start insurance auth and will cancel if he can go to CIR once medically stable for discharge. CSW called admissions coordinator at [facility] and left voicemail that patient had accepted bed offer and to go ahead and start auth.  [name], [place] [phone]  2:41 pm Patient declined CIR and they have signed off. CSW left another voicemail with admissions coordinator at [facility] to check status of starting insurance authorization. CSW spoke with front desk and admissions coordinator is at lunch and she advised to call back in another 20-30 mins.  [name], [place] [phone]  4:07 pm CSW attempted calling admissions coordinator and got voicemail again. Did not leave message.  [name], [place]

## 2015-10-15 NOTE — Procedures (Signed)
Continuous Video-EEG Monitoring Report  Patient: Paul Mullins, Paul Mullins     EEG No.ID: [id number]     DOB: 1950/06/29 Age: 65 Room#3E 6  MED REC NO: [id number] Gender: Male   TECH: [place]     Physician: Winfield Cunas     Referring Physician: Lunette Stands     Report Date: 10/15/2015      Study Duration:10/14/2015 07:30 to 10/15/2015 07:30 CPT [id number] Diagnosis:Seizures (R56.9)  History: This is a 65 year old male presenting with seizures. Video-EEG monitoring was performed to evaluate for seizures.  Technical Details: Long-term video-EEG monitoring was performed using standard setting per the guidelines. Briefly, a minimum of 21 electrodes were placed on scalp according to the International 10-20 or/and 10-10 Systems. Supplemental electrodes were placed as needed. Single EKG electrode was also used to detect cardiac arrhythmia. Patient's behavior was continuously recorded on video simultaneously with EEG. A minimum of 16 channels were used for data display. Each epoch of study was reviewed manually daily and as needed using standard referential and bipolar montages.   EEG Description: Diffuse beta activity was present, likely due to medication effect (such as benzodiazepine). No clear posterior dominant rhythm was seen during wakefulness. Intermittent generalized polymorphic delta slowing was present. No focal, lateralized or periodic abnormality was seen. No epileptiform discharges or seizures were in evidence.   Impression: This is an abnormal EEG due to the presence of intermittent generalized polymorphic delta slowing, suggesting mild to moderate encephalopathy, but medication effect (such as benzodiazepine) could not be excluded. Absence of interictal epileptiform discharges does not rule out a diagnosis of epilepsy.In comparison to yesterday's study, there is no significant change.                                                        [place] Physician: Winfield Cunas, MD, PhD

## 2015-10-15 NOTE — Progress Notes (Signed)
VLTM EEG complete/ no skin breakdown. Results pending

## 2015-10-15 NOTE — Progress Notes (Signed)
I met with pt at bedside to discuss his plans for rehab. He realizes he can not return home alone. His plans are to eventually d/c home with his sister in law in [place] who can provide care to him. I discussed a possible admission to inpt rehab her at [facility] and the benefits for close medical management and intense therapy. He prefers to d/c close to family in [facility] as he has planned with SW. I will alert RN CM and SW. We will sign off. [phone]

## 2015-10-15 NOTE — ED Provider Notes (Signed)
CSN: [id number]     Arrival date & time 10/11/15  1054 History   First MD Initiated Contact with Patient 10/11/15 1108     Chief Complaint  Patient presents with  . Fall  . Leg Pain     (Consider location/radiation/quality/duration/timing/severity/associated sxs/prior Treatment) HPI Patient has multiple complaints and right leg pain. States he cannot control his leg. States that he wasn't recently admitted to [facility] hospital. Has sutures to forehead and dried blood all over shirt. Repetitive questions and statements. Attempted to assist to stand to urinate and patient will not stand on right leg. Past Medical History  Diagnosis Date  . Hypertension   . Anginal pain ([place])   . Peripheral vascular disease ([place])   . Chronic systolic CHF (congestive heart failure) [facility])     [facility]; Dr. Rich Reining  . Coronary artery disease     08/08/12 - patient denied CAD, but is followed for CHF  . 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 Heart murmur     "since birth"; small membraneous VSD by 01/2012 echo   Past Surgical History  Procedure Laterality Date  . Neck surgery    . Neck surgery    . Stones    . Gallstones    . Femoral-popliteal bypass graft Right 08/17/2012    Procedure: BYPASS GRAFT FEMORAL-POPLITEAL ARTERY;  Surgeon: Mal Misty, MD;  Location: [place];  Service: Vascular;  Laterality: Right;  using non reversed Sapphenous vein with intraoperative arteriogram.  . Abdominal aortagram N/A 07/05/2012    Procedure: ABDOMINAL Maxcine Ham;  Surgeon: Conrad Lockeford, MD;  Location: [facility] CATH LAB;  Service: Cardiovascular;  Laterality: N/A;   No family history on file. Social History  Substance Use Topics  . Smoking status: Former Smoker    Types: Cigarettes    Quit date: 06/09/2012  . Smokeless tobacco: Never Used  . Alcohol Use: No    Review of Systems  Neurological: Positive for tremors and  weakness. Negative for facial asymmetry and speech difficulty.  All other systems reviewed and are negative.     Allergies  Ace inhibitors  Home Medications   Prior to Admission medications   Medication Sig Start Date End Date Taking? Authorizing Provider  amLODipine (NORVASC) 10 MG tablet Take 10 mg by mouth daily.   Yes Historical Provider, MD  aspirin EC 81 MG tablet Take 81 mg by mouth daily.   Yes Historical Provider, MD  atorvastatin (LIPITOR) 40 MG tablet Take 40 mg by mouth daily.   Yes Historical Provider, MD  cloNIDine (CATAPRES) 0.2 MG tablet Take 0.2 mg by mouth 2 (two) times daily.  05/22/13  Yes Historical Provider, MD  clopidogrel (PLAVIX) 75 MG tablet Take 75 mg by mouth daily.   Yes Historical Provider, MD  gabapentin (NEURONTIN) 300 MG capsule Take 300 mg by mouth 3 (three) times daily.   Yes Historical Provider, MD  isosorbide mononitrate (IMDUR) 30 MG 24 hr tablet Take 90 mg by mouth daily.   Yes Historical Provider, MD  labetalol (NORMODYNE) 200 MG tablet Take 200 mg by mouth 2 (two) times daily.   Yes Historical Provider, MD  levETIRAcetam (KEPPRA) 250 MG tablet Take 500 mg by mouth 2 (two) times daily.   Yes Historical Provider, MD  nitroGLYCERIN (NITROSTAT) 0.4 MG SL tablet Place 0.4 mg under the tongue every 5 (five) minutes as needed for chest pain.   Yes Historical  Provider, MD  pantoprazole (PROTONIX) 20 MG tablet Take 40 mg by mouth daily.   Yes Historical Provider, MD   BP 125/65 mmHg  Pulse 74  Temp(Src) 98.1 F (36.7 C) (Oral)  Resp 18  Ht 5\' 4"  (1.626 m)  Wt 173 lb 1.6 oz (78.518 kg)  BMI 29.70 kg/m2  SpO2 100% Physical Exam  Constitutional: He is oriented to person, place, and time. He appears well-developed and well-nourished. No distress.  HENT:  Head: Normocephalic and atraumatic.  Eyes: Pupils are equal, round, and reactive to light.  Neck: Normal range of motion.  Cardiovascular: Normal rate and intact distal pulses.   Pulmonary/Chest: No  respiratory distress.  Abdominal: Normal appearance. He exhibits no distension.  Musculoskeletal: Normal range of motion.  Neurological: He is alert and oriented to person, place, and time. He displays tremor (Rhythmic right lower extremity contractions consistent with partial seizure activity.). A cranial nerve deficit is present. GCS eye subscore is 4. GCS verbal subscore is 5. GCS motor subscore is 6.  Skin: Skin is warm and dry. No rash noted.  Psychiatric: He has a normal mood and affect. His behavior is normal.  Nursing note and vitals reviewed.   ED Course  Procedures (including critical care time) CRITICAL CARE Performed by: Leonard Schwartz L Total critical care time: 45 minutes Critical care time was exclusive of separately billable procedures and treating other patients. Critical care was necessary to treat or prevent imminent or life-threatening deterioration. Critical care was time spent personally by me on the following activities: development of treatment plan with patient and/or surrogate as well as nursing, discussions with consultants, evaluation of patient's response to treatment, examination of patient, obtaining history from patient or surrogate, ordering and performing treatments and interventions, ordering and review of laboratory studies, ordering and review of radiographic studies, pulse oximetry and re-evaluation of patient's condition.  Labs Review   Medications  ondansetron (ZOFRAN) injection 4 mg (not administered)  atorvastatin (LIPITOR) tablet 40 mg (40 mg Oral Given 10/14/15 1907)  gabapentin (NEURONTIN) capsule 300 mg (300 mg Oral Given 10/14/15 2241)  isosorbide mononitrate (IMDUR) 24 hr tablet 90 mg (90 mg Oral Given 10/14/15 1050)  labetalol (NORMODYNE) tablet 200 mg (200 mg Oral Given 10/14/15 2241)  pantoprazole (PROTONIX) EC tablet 40 mg (40 mg Oral Given 10/14/15 1050)  cloNIDine (CATAPRES) tablet 0.2 mg (0.2 mg Oral Given 10/14/15 2241)  clopidogrel  (PLAVIX) tablet 75 mg (75 mg Oral Given 10/14/15 1050)  amLODipine (NORVASC) tablet 10 mg (10 mg Oral Given 10/14/15 1050)  nitroGLYCERIN (NITROSTAT) SL tablet 0.4 mg (not administered)  aspirin EC tablet 81 mg (81 mg Oral Given 10/14/15 1050)  heparin injection 5,000 Units (5,000 Units Subcutaneous Given 10/14/15 2240)  hydrALAZINE (APRESOLINE) injection 10 mg (not administered)  LORazepam (ATIVAN) injection 1 mg (not administered)  diphenhydrAMINE (BENADRYL) 50 MG/ML injection (0 mg  Hold 10/11/15 1933)  lacosamide (VIMPAT) 200 mg in sodium chloride 0.9 % 25 mL IVPB (200 mg Intravenous Given 10/15/15 0622)  polyethylene glycol (MIRALAX / GLYCOLAX) packet 17 g (17 g Oral Given 10/14/15 1049)  levETIRAcetam (KEPPRA) 750 mg in sodium chloride 0.9 % 100 mL IVPB (750 mg Intravenous Given 10/15/15 0550)  cloBAZam (ONFI) tablet 20 mg (20 mg Oral Given 10/14/15 2240)  fosPHENYtoin (CEREBYX) 1,222 mg PE in sodium chloride 0.9 % 50 mL IVPB (0 mg PE/kg  61.1 kg Intravenous Stopped 10/11/15 1627)  metoprolol (LOPRESSOR) injection 5 mg (5 mg Intravenous Given 10/11/15 1615)  gadobenate dimeglumine (MULTIHANCE) injection  6 mL (6 mLs Intravenous Contrast Given 10/11/15 1533)  diphenhydrAMINE (BENADRYL) injection 25 mg (25 mg Intravenous Given 10/11/15 1643)  lacosamide (VIMPAT) 200 mg in sodium chloride 0.9 % 25 mL IVPB (200 mg Intravenous Given 10/11/15 1902)    Results for orders placed or performed during the hospital encounter of 10/11/15  CBC with Differential/Platelet  Result Value Ref Range   WBC 5.6 4.0 - 10.5 K/uL   RBC 4.33 4.22 - 5.81 MIL/uL   Hemoglobin 12.8 (L) 13.0 - 17.0 g/dL   HCT 39.1 39.0 - 52.0 %   MCV 90.3 78.0 - 100.0 fL   MCH 29.6 26.0 - 34.0 pg   MCHC 32.7 30.0 - 36.0 g/dL   RDW 13.5 11.5 - 15.5 %   Platelets 222 150 - 400 K/uL   Neutrophils Relative % 47 %   Neutro Abs 2.6 1.7 - 7.7 K/uL   Lymphocytes Relative 26 %   Lymphs Abs 1.5 0.7 - 4.0 K/uL   Monocytes Relative 8 %   Monocytes  Absolute 0.5 0.1 - 1.0 K/uL   Eosinophils Relative 19 %   Eosinophils Absolute 1.1 (H) 0.0 - 0.7 K/uL   Basophils Relative 0 %   Basophils Absolute 0.0 0.0 - 0.1 K/uL  Comprehensive metabolic panel  Result Value Ref Range   Sodium 138 135 - 145 mmol/L   Potassium 4.5 3.5 - 5.1 mmol/L   Chloride 105 101 - 111 mmol/L   CO2 24 22 - 32 mmol/L   Glucose, Bld 96 65 - 99 mg/dL   BUN 20 6 - 20 mg/dL   Creatinine, Ser 2.00 (H) 0.61 - 1.24 mg/dL   Calcium 9.0 8.9 - 10.3 mg/dL   Total Protein 7.8 6.5 - 8.1 g/dL   Albumin 3.4 (L) 3.5 - 5.0 g/dL   AST 36 15 - 41 U/L   ALT 22 17 - 63 U/L   Alkaline Phosphatase 56 38 - 126 U/L   Total Bilirubin 1.1 0.3 - 1.2 mg/dL   GFR calc non Af Amer 33 (L) >60 mL/min   GFR calc Af Amer 39 (L) >60 mL/min   Anion gap 9 5 - 15  CBC  Result Value Ref Range   WBC 5.0 4.0 - 10.5 K/uL   RBC 4.31 4.22 - 5.81 MIL/uL   Hemoglobin 12.8 (L) 13.0 - 17.0 g/dL   HCT 39.2 39.0 - 52.0 %   MCV 91.0 78.0 - 100.0 fL   MCH 29.7 26.0 - 34.0 pg   MCHC 32.7 30.0 - 36.0 g/dL   RDW 13.4 11.5 - 15.5 %   Platelets 219 150 - 400 K/uL  Creatinine, serum  Result Value Ref Range   Creatinine, Ser 2.05 (H) 0.61 - 1.24 mg/dL   GFR calc non Af Amer 32 (L) >60 mL/min   GFR calc Af Amer 37 (L) >60 mL/min  CBC with Differential/Platelet  Result Value Ref Range   WBC 4.8 4.0 - 10.5 K/uL   RBC 3.78 (L) 4.22 - 5.81 MIL/uL   Hemoglobin 11.0 (L) 13.0 - 17.0 g/dL   HCT 34.9 (L) 39.0 - 52.0 %   MCV 92.3 78.0 - 100.0 fL   MCH 29.1 26.0 - 34.0 pg   MCHC 31.5 30.0 - 36.0 g/dL   RDW 13.8 11.5 - 15.5 %   Platelets 197 150 - 400 K/uL   Neutrophils Relative % 36 %   Neutro Abs 1.7 1.7 - 7.7 K/uL   Lymphocytes Relative 35 %   Lymphs  Abs 1.7 0.7 - 4.0 K/uL   Monocytes Relative 6 %   Monocytes Absolute 0.3 0.1 - 1.0 K/uL   Eosinophils Relative 23 %   Eosinophils Absolute 1.1 (H) 0.0 - 0.7 K/uL   Basophils Relative 0 %   Basophils Absolute 0.0 0.0 - 0.1 K/uL  Basic metabolic panel   Result Value Ref Range   Sodium 139 135 - 145 mmol/L   Potassium 3.9 3.5 - 5.1 mmol/L   Chloride 110 101 - 111 mmol/L   CO2 21 (L) 22 - 32 mmol/L   Glucose, Bld 106 (H) 65 - 99 mg/dL   BUN 28 (H) 6 - 20 mg/dL   Creatinine, Ser 2.13 (H) 0.61 - 1.24 mg/dL   Calcium 7.9 (L) 8.9 - 10.3 mg/dL   GFR calc non Af Amer 31 (L) >60 mL/min   GFR calc Af Amer 36 (L) >60 mL/min   Anion gap 8 5 - 15  Hemoglobin A1c  Result Value Ref Range   Hgb A1c MFr Bld 5.5 4.8 - 5.6 %   Mean Plasma Glucose 111 mg/dL  Lipid panel  Result Value Ref Range   Cholesterol 129 0 - 200 mg/dL   Triglycerides 132 <150 mg/dL   HDL 34 (L) >40 mg/dL   Total CHOL/HDL Ratio 3.8 RATIO   VLDL 26 0 - 40 mg/dL   LDL Cholesterol 69 0 - 99 mg/dL  CBC  Result Value Ref Range   WBC 4.0 4.0 - 10.5 K/uL   RBC 3.49 (L) 4.22 - 5.81 MIL/uL   Hemoglobin 10.1 (L) 13.0 - 17.0 g/dL   HCT 32.1 (L) 39.0 - 52.0 %   MCV 92.0 78.0 - 100.0 fL   MCH 28.9 26.0 - 34.0 pg   MCHC 31.5 30.0 - 36.0 g/dL   RDW 13.8 11.5 - 15.5 %   Platelets 217 150 - 400 K/uL  Basic metabolic panel  Result Value Ref Range   Sodium 136 135 - 145 mmol/L   Potassium 4.1 3.5 - 5.1 mmol/L   Chloride 106 101 - 111 mmol/L   CO2 23 22 - 32 mmol/L   Glucose, Bld 139 (H) 65 - 99 mg/dL   BUN 24 (H) 6 - 20 mg/dL   Creatinine, Ser 1.93 (H) 0.61 - 1.24 mg/dL   Calcium 7.7 (L) 8.9 - 10.3 mg/dL   GFR calc non Af Amer 35 (L) >60 mL/min   GFR calc Af Amer 40 (L) >60 mL/min   Anion gap 7 5 - 15  CBC  Result Value Ref Range   WBC 7.5 4.0 - 10.5 K/uL   RBC 3.47 (L) 4.22 - 5.81 MIL/uL   Hemoglobin 10.1 (L) 13.0 - 17.0 g/dL   HCT 31.6 (L) 39.0 - 52.0 %   MCV 91.1 78.0 - 100.0 fL   MCH 29.1 26.0 - 34.0 pg   MCHC 32.0 30.0 - 36.0 g/dL   RDW 13.8 11.5 - 15.5 %   Platelets 216 150 - 400 K/uL  Basic metabolic panel  Result Value Ref Range   Sodium 139 135 - 145 mmol/L   Potassium 3.8 3.5 - 5.1 mmol/L   Chloride 107 101 - 111 mmol/L   CO2 23 22 - 32 mmol/L   Glucose,  Bld 121 (H) 65 - 99 mg/dL   BUN 20 6 - 20 mg/dL   Creatinine, Ser 1.63 (H) 0.61 - 1.24 mg/dL   Calcium 7.7 (L) 8.9 - 10.3 mg/dL   GFR calc non Af Amer 43 (  L) >60 mL/min   GFR calc Af Amer 49 (L) >60 mL/min   Anion gap 9 5 - 15  CBC  Result Value Ref Range   WBC 5.7 4.0 - 10.5 K/uL   RBC 3.44 (L) 4.22 - 5.81 MIL/uL   Hemoglobin 10.3 (L) 13.0 - 17.0 g/dL   HCT 32.7 (L) 39.0 - 52.0 %   MCV 95.1 78.0 - 100.0 fL   MCH 29.9 26.0 - 34.0 pg   MCHC 31.5 30.0 - 36.0 g/dL   RDW 13.9 11.5 - 15.5 %   Platelets 227 150 - 400 K/uL  Basic metabolic panel  Result Value Ref Range   Sodium 137 135 - 145 mmol/L   Potassium 3.6 3.5 - 5.1 mmol/L   Chloride 107 101 - 111 mmol/L   CO2 24 22 - 32 mmol/L   Glucose, Bld 111 (H) 65 - 99 mg/dL   BUN 19 6 - 20 mg/dL   Creatinine, Ser 1.79 (H) 0.61 - 1.24 mg/dL   Calcium 7.4 (L) 8.9 - 10.3 mg/dL   GFR calc non Af Amer 38 (L) >60 mL/min   GFR calc Af Amer 44 (L) >60 mL/min   Anion gap 6 5 - 15   Mr Jeri Cos Wo Contrast  10/11/2015  CLINICAL DATA:  Seizures. EXAM: MRI HEAD WITHOUT AND WITH CONTRAST TECHNIQUE: Multiplanar, multiecho pulse sequences of the brain and surrounding structures were obtained without and with intravenous contrast. CONTRAST:  63mL MULTIHANCE GADOBENATE DIMEGLUMINE 529 MG/ML IV SOLN COMPARISON:  Head CT 10/07/2015 and MRI 08/19/2015 FINDINGS: Confluent region of T2 hyperintensity/edema in the left parietal lobe and posterior cingulate gyrus has mildly progressed from the prior MRI, again with mild mass effect with sulcal effacement. Diffusion is facilitated, and this extends over approximately 7 mm. There is new left parietal cortical trace diffusion signal abnormality at the margins of this region with relatively normal ADC. Small foci of susceptibility artifact within this region are unchanged and compatible with remote hemorrhages. There is some mildly prominent leptomeningeal vascular enhancement in this region as well as some non masslike  petechial parenchymal enhancement. Elsewhere, there is no evidence of acute infarct. No extra-axial fluid collection or midline shift. Slight mass effect on the left lateral ventricle from the left parietal abnormality, with the ventricles otherwise normal in size. Scattered, small foci of T2 hyperintensity elsewhere in the cerebral white matter bilaterally are unchanged and nonspecific but compatible with mild chronic small vessel ischemic disease. No abnormal enhancement is identified elsewhere. Orbits are unremarkable. Mild mucosal thickening is noted in the paranasal sinuses, and there is a small chronic right mastoid effusion. Major intracranial vascular flow voids are unchanged. IMPRESSION: 1. Mild further enlargement of the region of left parietal signal abnormality with small amount of petechial enhancement. 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 Subacute infarct would be expected to have shown noticeable improvement in this timeframe, even if there had been some additional ischemia in the interim. Cerebritis could also give this appearance though does not fit the clinical presentation. 2. Mild left parietal cortical diffusion abnormality likely reflects recent seizure activity. Electronically Signed   By: Logan Bores M.D.   On: 10/11/2015 16:35   Dg Chest Portable 1 View  10/11/2015  CLINICAL DATA:  Seizure like activity, mid sternal chest pain, recent slightly productive cough for 1 week, history hypertension, CHF, coronary artery disease,  VSD EXAM: PORTABLE CHEST 1 VIEW COMPARISON:  Portable exam 0033 hours compared to 08/19/2015 FINDINGS: Normal heart size, mediastinal contours, and pulmonary vascularity. Lungs clear. No pleural effusion or pneumothorax. Bones unremarkable. IMPRESSION: No acute abnormalities. Electronically Signed   By: Lavonia Dana M.D.   On: 10/11/2015 12:57       EKG Interpretation   Date/Time:  Sunday October 11 2015 15:54:59 EDT Ventricular Rate:  75 PR Interval:    QRS Duration: 94 QT Interval:  403 QTC Calculation: 451 R Axis:   71 Text Interpretation:  Sinus rhythm Atrial premature complexes Consider  left ventricular hypertrophy since last tracing no significant change  Confirmed by BELFI  MD, MELANIE ([id number]) on 10/12/2015 9:04:50 AM     I did consult neurology came and saw the patient emergency room.  He recommended admission to the hospital.  We started antiepileptic medicines in the ED.  Patient was also treated for hypertension. MDM   Final diagnoses:  Hypertensive urgency  Seizure [facility])        Leonard Schwartz, MD 10/15/15 [phone]

## 2015-10-16 LAB — CBC
HCT: 33.2 % — ABNORMAL LOW (ref 39.0–52.0)
Hemoglobin: 10.4 g/dL — ABNORMAL LOW (ref 13.0–17.0)
MCH: 30.2 pg (ref 26.0–34.0)
MCHC: 31.3 g/dL (ref 30.0–36.0)
MCV: 96.5 fL (ref 78.0–100.0)
PLATELETS: 252 10*3/uL (ref 150–400)
RBC: 3.44 MIL/uL — ABNORMAL LOW (ref 4.22–5.81)
RDW: 14.1 % (ref 11.5–15.5)
WBC: 5.9 10*3/uL (ref 4.0–10.5)

## 2015-10-16 LAB — BASIC METABOLIC PANEL
Anion gap: 8 (ref 5–15)
BUN: 21 mg/dL — ABNORMAL HIGH (ref 6–20)
CALCIUM: 7.4 mg/dL — AB (ref 8.9–10.3)
CO2: 25 mmol/L (ref 22–32)
Chloride: 106 mmol/L (ref 101–111)
Creatinine, Ser: 1.85 mg/dL — ABNORMAL HIGH (ref 0.61–1.24)
GFR calc Af Amer: 42 mL/min — ABNORMAL LOW (ref >60)
GFR, EST NON AFRICAN AMERICAN: 37 mL/min — AB (ref >60)
Glucose, Bld: 86 mg/dL (ref 65–99)
Potassium: 3.6 mmol/L (ref 3.5–5.1)
SODIUM: 139 mmol/L (ref 135–145)

## 2015-10-16 NOTE — Progress Notes (Signed)
Subjective: Patient reports "My leg quit jumping so I'm happy about that"  Objective: Vital signs in last 24 hours: Temp:  [98.1 F (36.7 C)-98.9 F (37.2 C)] 98.1 F (36.7 C) (06/30 0540) Pulse Rate:  [84-90] 89 (06/30 0540) Resp:  [16-18] 18 (06/30 0540) BP: (122-153)/(56-71) 153/60 mmHg (06/30 0540) SpO2:  [99 %-100 %] 100 % (06/30 0540) Weight:  [80.468 kg (177 lb 6.4 oz)] 80.468 kg (177 lb 6.4 oz) (06/30 0540)  Intake/Output from previous day: 06/29 0701 - 06/30 0700 In: 1520 [P.O.:1160; IV Piggyback:360] Out: 800 [Urine:800] Intake/Output this shift: Total I/O In: 240 [P.O.:240] Out: 200 [Urine:200]  Alert, conversant, sitting in chair. Recalls two ER visits, but does not remember falls or seizure activity. PEARL 28mm. No drift. Tongue protrudes midline. Good strength all extremities.   Lab Results:  Recent Labs  10/15/15 0254 10/16/15 0554  WBC 5.7 5.9  HGB 10.3* 10.4*  HCT 32.7* 33.2*  PLT 227 252   BMET  Recent Labs  10/15/15 0254 10/16/15 0554  NA 137 139  K 3.6 3.6  CL 107 106  CO2 24 25  GLUCOSE 111* 86  BUN 19 21*  CREATININE 1.79* 1.85*  CALCIUM 7.4* 7.4*    Studies/Results: No results found.  Assessment/Plan:   LOS: 5 days  Will need craniotomy for biopsy of enlarging lesion. Dr. Vertell Limber will discuss with Medicine re: f/u inpatient vs. outpatient   Shankar Silber, Aaron Edelman 10/16/2015, 9:56 AM

## 2015-10-16 NOTE — Progress Notes (Signed)
Physical Therapy Treatment Patient Details Name: Paul Mullins MRN: [id number] DOB: December 16, 1950 Today's Date: 10/16/2015    History of Present Illness Pt is a 65 y/o M who presented w/ c/o distal Rt LE shaking and worsening weakness w/ fall.  MRI showed white matter edema, concerning for neoplasm.  Of note, pt ran out of his Keppra a few days prior to admission.  Pt's PMH includes neck surgery, Rt fem-pop bypass, PVD, CHF.    PT Comments    The patient is much improved in ambulation today. Ambulated x 90' x 2. The patient plans to go to SNF. Continue PT while in acute care.  Follow Up Recommendations  SNF;Supervision/Assistance - 24 hour     Equipment Recommendations  None recommended by PT    Recommendations for Other Services       Precautions / Restrictions Precautions Precautions: Fall;Other (comment) Precaution Comments: ; Seizure    Mobility  Bed Mobility               General bed mobility comments: in recliner  Transfers   Equipment used: Rolling walker (2 wheeled) Transfers: Sit to/from Stand Sit to Stand: Supervision         General transfer comment: slow to ride from the recliner, cues for hand position  Ambulation/Gait Ambulation/Gait assistance: Min assist Ambulation Distance (Feet): 90 Feet (x 2) Assistive device: Rolling walker (2 wheeled) Gait Pattern/deviations: Step-to pattern;Decreased step length - right;Decreased dorsiflexion - right     General Gait Details: right foot drop, decreased control of swing , at times the right foot swings into the RW, no ataxia noted   Stairs            Wheelchair Mobility    Modified Rankin (Stroke Patients Only)       Balance Overall balance assessment: Needs assistance Sitting-balance support: No upper extremity supported;Feet supported Sitting balance-Leahy Scale: Good     Standing balance support: During functional activity;Bilateral upper extremity supported Standing balance-Leahy  Scale: Fair Standing balance comment: much improved in balance                    Cognition Arousal/Alertness: Awake/alert Behavior During Therapy: WFL for tasks assessed/performed Overall Cognitive Status: Within Functional Limits for tasks assessed                      Exercises      General Comments        Pertinent Vitals/Pain Pain Assessment: No/denies pain    Home Living                      Prior Function            PT Goals (current goals can now be found in the care plan section) Progress towards PT goals: Progressing toward goals    Frequency  Min 3X/week    PT Plan Current plan remains appropriate    Co-evaluation             End of Session Equipment Utilized During Treatment: Gait belt Activity Tolerance: Patient tolerated treatment well Patient left: in chair;with call bell/phone within reach;with chair alarm set     Time: [id number] PT Time Calculation (min) (ACUTE ONLY): 35 min  Charges:  $Gait Training: 23-37 mins                    G Codes:      Claretha Cooper 10/16/2015, 12:45 PM Santiago Glad  [place] PT [phone]

## 2015-10-16 NOTE — Progress Notes (Signed)
   10/16/15 1500  Clinical Encounter Type  Visited With Patient and family together  Visit Type Other (Comment) (AD)  Referral From Nurse  Consult/Referral To [facility] met with patient and family to explain the AD.  Patient will review further and fill out. Roe Coombs 10/16/2015

## 2015-10-16 NOTE — Progress Notes (Signed)
Patient ID: Paul Mullins, male   DOB: 1951/04/12, 65 y.o.   MRN: [id number]  PROGRESS NOTE    Swanson Kostoff  [name] DOB: Feb 24, 1951 DOA: 10/11/2015  PCP: Alfonso Patten, MD   Brief Narrative:  50 -year-old male with past medical history of seizures, hypertension, coronary artery disease, chronic systolic CHF, status post right femoral-popliteal bypass in 2014. He presented [facility] the day prior to the admission for weakness and confusion but subsequently discharged home. He then presented back to the hospital because of complains of distal right leg shaking and worsening weakness with fall. He was seen by neurology in consultation. They recommended Keppra in addition to Vimpat. MRI with and without contrast showed white matter edema, concerning for neoplasm.   Assessment & Plan:   Principal Problem: Encephalopathy -due to seizures, possible malignancy as noted on MRI of the brain with and without contrast - Appreciate Neurosurgery consult, ? 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 - i called and asked his sister Ms.Cletus Gash (Home:[phone] or Cell:[phone]) to come to the hospital to discuss further with Dr.Stern -Please note: he had a CT chest Abd and pelvis on 10/07/15 at [facility] in [place]: In Care Everywhere:  (IMPRESSION: 1. No acute thoracic or abdominal process. 2. Mediastinal and bilateral inguinal adenopathy, partially improved from earlier studies, possibly reactive but nonspecific. 3. Atherosclerosis, including Aortic Atherosclerosis)  Essential hypertension - Continue Norvasc  10 mg daily, clonidine 0.2 mg twice daily, isosorbide 90 mg daily, labetalol 200 mg twice daily  Dyslipidemia - Continue Lipitor 40 mg daily  Coronary artery disease, native artery without angina pectoris - Continue aspirin and Plavix  Chronic kidney disease stage III - Baseline creatinine 1.4 few months prior to this admission - Creatinine 2.05 on this admission and further up to 2.13 but fortunately improving to 1.93 this am  - Not on any nephrotoxic medications - now in 1.6-1.7range  Anemia of chronic kidney disease  - Hemoglobin stable  DVT prophylaxis: Heparin subQ  Code Status: full code  Family Communication: no family at the bedside this am Disposition Plan: SNF depending on Neurosurgery  Consultants:   Neurology   Neurosurgery  Procedures:   EEG 6/26 - pending   Antimicrobials:   None    Subjective: Feels better., no complaints  Objective: Filed Vitals:   10/15/15 1000 10/15/15 1156 10/15/15 2015 10/16/15 0540  BP: 133/65 142/71 122/56 153/60  Pulse: 90 84 84 89  Temp: 98.1 F (36.7 C) 98.9 F (37.2 C) 98.9 F (37.2 C) 98.1 F (36.7 C)  TempSrc: Oral Oral Oral Oral  Resp:  16 18 18   Height:      Weight:    80.468 kg (177 lb 6.4 oz)  SpO2: 100% 99% 100% 100%    Intake/Output Summary (Last 24 hours) at 10/16/15 1148 Last data filed at 10/16/15 1125  Gross per 24 hour  Intake   1520 ml  Output   1301 ml  Net    219 ml   Filed Weights   10/13/15 0300 10/14/15 0621 10/16/15 0540  Weight: 76.749 kg (169 lb 3.2 oz) 78.518 kg (173 lb 1.6 oz) 80.468 kg (177 lb 6.4 oz)    Examination:  General exam:  Appears calm and comfortable  Respiratory system: Clear to auscultation. Respiratory effort normal. Cardiovascular system: S1 & S2 heard, RRR. No JVD, murmurs, rubs, gallops or clicks. 1 plus edema. Gastrointestinal system: Abdomen is nondistended, soft and nontender. No organomegaly or masses felt. Normal bowel sounds heard. Central nervous  system: Alert and oriented. No focal neurological deficits. Extremities: Symmetric 5 x 5 power. Skin: No rashes, lesions or ulcers Psychiatry: Judgement and insight appear normal. Mood & affect appropriate.   Data Reviewed: I have personally reviewed following labs and imaging studies  CBC:  Recent Labs Lab 10/11/15 1127  10/12/15 0541 10/13/15 0456 10/14/15 0550 10/15/15 0254 10/16/15 0554  WBC 5.6  < > 4.8 4.0 7.5 5.7 5.9  NEUTROABS 2.6  --  1.7  --   --   --   --   HGB 12.8*  < > 11.0* 10.1* 10.1* 10.3* 10.4*  HCT 39.1  < > 34.9* 32.1* 31.6* 32.7* 33.2*  MCV 90.3  < > 92.3 92.0 91.1 95.1 96.5  PLT 222  < > 197 217 216 227 252  < > = values in this interval not displayed. Basic Metabolic Panel:  Recent Labs Lab 10/12/15 0541 10/13/15 0456 10/14/15 0550 10/15/15 0254 10/16/15 0554  NA 139 136 139 137 139  K 3.9 4.1 3.8 3.6 3.6  CL 110 106 107 107 106  CO2 21* 23 23 24 25   GLUCOSE 106* 139* 121* 111* 86  BUN 28* 24* 20 19 21*  CREATININE 2.13* 1.93* 1.63* 1.79* 1.85*  CALCIUM 7.9* 7.7* 7.7* 7.4* 7.4*   GFR: Estimated Creatinine Clearance: 38.1 mL/min (by C-G formula based on Cr of 1.85). Liver Function Tests:  Recent Labs Lab 10/11/15 1127  AST 36  ALT 22  ALKPHOS 56  BILITOT 1.1  PROT 7.8  ALBUMIN 3.4*   No results for input(s): LIPASE, AMYLASE in the last 168 hours. No results for input(s): AMMONIA in the last 168 hours. Coagulation Profile: No results for input(s): INR, PROTIME in the last 168 hours. Cardiac Enzymes: No results for input(s): CKTOTAL, CKMB, CKMBINDEX, TROPONINI in the last 168 hours. BNP (last 3 results) No results for input(s): PROBNP in the last 8760 hours. HbA1C: No results for input(s): HGBA1C in the last 72 hours. CBG: No results for input(s): GLUCAP in the last 168 hours. Lipid Profile: No results for input(s): CHOL, HDL, LDLCALC, TRIG, CHOLHDL, LDLDIRECT in the last 72 hours. Thyroid Function Tests: No results for  input(s): TSH, T4TOTAL, FREET4, T3FREE, THYROIDAB in the last 72 hours. Anemia Panel: No results for input(s): VITAMINB12, FOLATE, FERRITIN, TIBC, IRON, RETICCTPCT in the last 72 hours. Urine analysis:    Component Value Date/Time   COLORURINE YELLOW 08/08/2012 [place] 08/08/2012 1258   LABSPEC 1.011 08/08/2012 1258   PHURINE 7.0 08/08/2012 1258   GLUCOSEU NEGATIVE 08/08/2012 1258   HGBUR NEGATIVE 08/08/2012 1258   BILIRUBINUR NEGATIVE 08/08/2012 1258   KETONESUR NEGATIVE 08/08/2012 1258   PROTEINUR NEGATIVE 08/08/2012 1258   UROBILINOGEN 0.2 08/08/2012 1258   NITRITE NEGATIVE 08/08/2012 1258   LEUKOCYTESUR NEGATIVE 08/08/2012 1258   Sepsis Labs: @LABRCNTIP (procalcitonin:4,lacticidven:4)   )No results found for this or any previous visit (from the past 240 hour(s)).    Radiology Studies: Mr Jeri Cos [facility] Contrast 10/11/2015  1. Mild further enlargement of the region of left parietal signal abnormality with small amount of petechial enhancement. Given the mild progression from last month's MRI as well as an appearance which is largely that of  white matter edema, this is most concerning for neoplasm. Subacute infarct would be expected to have shown noticeable improvement in this timeframe, even if there had been some additional ischemia in the interim. Cerebritis could also give this appearance though does not fit the clinical presentation. 2. Mild left parietal cortical diffusion abnormality likely reflects recent seizure activity.   Dg Chest Portable 1 View 10/11/2015 No acute abnormalities. Electronically Signed   By: Lavonia Dana M.D.   On: 10/11/2015 12:57      Scheduled Meds: . amLODipine  10 mg Oral Daily  . aspirin EC  81 mg Oral Daily  . atorvastatin  40 mg Oral q1800  . cloBAZam  20 mg Oral BID  . cloNIDine  0.2 mg Oral BID  . clopidogrel  75 mg Oral Daily  . gabapentin  300 mg Oral TID  . heparin  5,000 Units Subcutaneous BID  . isosorbide mononitrate   90 mg Oral Daily  . labetalol  200 mg Oral BID  . lacosamide (VIMPAT) IV  200 mg Intravenous Q12H  . levETIRAcetam  750 mg Oral Q12H  . pantoprazole  40 mg Oral Daily  . polyethylene glycol  17 g Oral Daily   Continuous Infusions:     LOS: 5 days    Time spent: 35 minutes Greater than 50% of the time spent on counseling and coordinating the care.   Domenic Polite, MD [facility] Pager [phone]  If 7PM-7AM, please contact night-coverage [email or web address] Password [email or web address] 10/16/2015, 11:48 AM

## 2015-10-16 NOTE — Care Management Important Message (Signed)
Important Message  Patient Details  Name: Paul Mullins MRN: [id number] Date of Birth: February 05, 1951   Medicare Important Message Given:  Yes    Timothy Trudell Abena 10/16/2015, 11:22 AM

## 2015-10-16 NOTE — Clinical Social Work Note (Addendum)
CSW called and left another voicemail for admissions coordinator at [facility] regarding status of starting [place]. Will give until 12:00 today to call me back or patient will have to choose another facility due to not being able to contact her.  [name], [place] [phone]  9:34 am Received voicemail from Clayton, admissions coordinator at [facility]. She stated that she was out of the office yesterday but when they received the original referral, patient had Medicare A & B. She will check for out of network benefits with [facility] as they are not in network with them. CSW called back and left voicemail on Angela's cell phone.   [name], [place] [phone]  10:12 am Patient does not have out-of-network benefits. Will discuss other SNF options with him and possible LOG.  [name], CSW [phone]  10:38 am CSW met with patient to discuss other options. Patient chose [facility]. CSW emphasized that this is short-term rehab and then he would return home with his sister-in-law. CSW sent message to admissions coordinator at [facility] [place], and asked about possible fast track for [facility].  [name], [place] [phone]  11:11 am Per RN, patient's family want him to go to [facility] SNF due to proximity to home. CSW left voicemail with admissions coordinator, Ivery Quale asking if they accept LOG's.   [name], [place] [phone]  11:48 am Spoke with MD. Neurosurgery is deciding whether to do brain biopsy inpatient or outpatient. They will speak with sister-in-law today and that will determine whether he discharges today or not. [facility] notified.  [name], [place]  12:49 pm Derenda Mis, admissions coordinator at [facility] SNF returning voicemail. They do not accept LOG's and would not be able to get [facility] auth until the middle of next week due to the July 4th holiday. Discussed neurosurgery decision to do brain  biopsy inpatient or outpatient and she said that they would not take him if he is having an outpatient surgery anyway because they would have to pay for it. CSW thanked Ms. Oden for call.  [name], [place] [phone]  3:08 pm Patient's sister and nephew at bedside. Patient's nephew inquired about becoming HCPOA. Patient verbalized to CSW that he wants this to happen. CSW obtained contact information for sister and nephew and put in chart. The other contact was an old girlfriend possibly. Discussed SNF process and insurance authorization with nephew.  [name], [place]

## 2015-10-17 MED ORDER — FUROSEMIDE 20 MG PO TABS
20.0000 mg | ORAL_TABLET | Freq: Every day | ORAL | Status: DC
  Administered 2015-10-17 – 2015-10-20 (×4): 20 mg via ORAL
  Filled 2015-10-17 (×4): qty 1

## 2015-10-17 NOTE — Progress Notes (Signed)
Patient ID: Paul Mullins, male   DOB: [birth year], 65 y.o.   MRN: [id number] Patient doing well with no significant headache  Awake alert oriented moves all extremities well  Dr. Vertell Limber to reevaluate on Monday and talk about biopsy

## 2015-10-17 NOTE — Progress Notes (Addendum)
Patient ID: Paul Mullins, male   DOB: Mar 29, 1951, 65 y.o.   MRN: [id number]  PROGRESS NOTE    Paul Mullins  [name] DOB: 1950/11/18 DOA: 10/11/2015  PCP: Paul Patten, MD   Brief Narrative:  65 -year-old male with past medical history of seizures, hypertension, coronary artery disease, chronic systolic CHF, status post right femoral-popliteal bypass in 2014. [facility] the day prior to the admission for weakness and confusion but subsequently discharged home. He then presented back to the hospital because of complains of distal right leg shaking and worsening weakness with fall. He was seen by neurology in consultation. They recommended Keppra in addition to Vimpat. MRI with and without contrast showed white matter edema, concerning for neoplasm.   Assessment & Plan:   Principal Problem: Encephalopathy - due to seizures, possible malignancy as noted on MRI of the brain with and without contrast - Appreciate Neurosurgery consult, ? 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 - i called and asked his sister Ms.Paul Mullins (Home:[phone] or Cell:[phone]) to come to the hospital to discuss further with Dr.Stern, pt and sister seemed to agreeable with recommendations for biopsy, but she needs to d/w Dr.Stern regarding risks etc. -Please note: he had a CT chest Abd and pelvis on 10/07/15 at [facility] in [place]: In Care Everywhere:  (IMPRESSION: 1. No acute thoracic or abdominal process. 2. Mediastinal and bilateral inguinal adenopathy, partially improved from earlier studies, possibly  reactive but nonspecific. 3. Atherosclerosis, including Aortic Atherosclerosis)  Essential hypertension - Continue Norvasc 10 mg daily, clonidine 0.2 mg twice daily, isosorbide 90 mg daily, labetalol 200 mg twice daily  Dyslipidemia - Continue Lipitor 40 mg daily  Coronary artery disease, native artery without angina pectoris - Continue aspirin and Plavix  Chronic kidney disease stage III - Baseline creatinine 1.4 few months prior to this admission - Creatinine 2.05 on this admission and further up to 2.13 but fortunately improving to 1.93 this am  - Not on any nephrotoxic medications - now in 1.6-1.7range  Anemia of chronic kidney disease  - Hemoglobin stable  Chronic systolic CHF -resume PO lasix  DVT prophylaxis: Heparin subQ  Code Status: full code  Family Communication: no family at the bedside, called and d/w sister Ms.Paul Mullins Disposition Plan: SNF depending on Neurosurgery plans  Consultants:   Neurology   Neurosurgery  Procedures:   EEG 6/26 - pending   Antimicrobials:   None    Subjective: Feels better., no complaints  Objective: Filed Vitals:   10/16/15 0540 10/16/15 2029 10/17/15 0613 10/17/15 1141  BP: 153/60 144/62 157/64 110/69  Pulse: 89 92 86 93  Temp: 98.1 F (36.7 C) 98.4 F (36.9 C) 98.4 F (36.9 C) 98.2 F (36.8 C)  TempSrc: Oral Oral Oral Oral  Resp: 18 18 16 18   Height:      Weight: 80.468 kg (177 lb 6.4 oz)  81.647 kg (180 lb)   SpO2: 100% 100% 98% 100%    Intake/Output Summary (Last 24 hours) at 10/17/15 1253 Last data filed at 10/17/15 0900  Gross per 24 hour  Intake    870 ml  Output   1775 ml  Net   -905 ml   [employer]  10/14/15 0621 10/16/15 0540 10/17/15 0613  Weight: 78.518 kg (173 lb 1.6 oz) 80.468 kg (177 lb 6.4 oz) 81.647 kg (180 lb)    Examination:  General exam: Appears calm and comfortable  Respiratory system: Clear to auscultation. Respiratory effort normal. Cardiovascular system: S1 & S2 heard,  RRR. No JVD, murmurs, rubs, gallops or clicks. 1 plus edema. Gastrointestinal system: Abdomen is nondistended, soft and nontender. No organomegaly or masses felt. Normal bowel sounds heard. Central nervous system: Alert and oriented. No focal neurological deficits. Extremities: Symmetric 5 x 5 power. Skin: No rashes, lesions or ulcers Psychiatry: Judgement and insight appear normal. Mood & affect appropriate.   Data Reviewed: I have personally reviewed following labs and imaging studies  CBC:  Recent Labs Lab 10/11/15 1127  10/12/15 0541 10/13/15 0456 10/14/15 0550 10/15/15 0254 10/16/15 0554  WBC 5.6  < > 4.8 4.0 7.5 5.7 5.9  NEUTROABS 2.6  --  1.7  --   --   --   --   HGB 12.8*  < > 11.0* 10.1* 10.1* 10.3* 10.4*  HCT 39.1  < > 34.9* 32.1* 31.6* 32.7* 33.2*  MCV 90.3  < > 92.3 92.0 91.1 95.1 96.5  PLT 222  < > 197 217 216 227 252  < > = values in this interval not displayed. Basic Metabolic Panel:  Recent Labs Lab 10/12/15 0541 10/13/15 0456 10/14/15 0550 10/15/15 0254 10/16/15 0554  NA 139 136 139 137 139  K 3.9 4.1 3.8 3.6 3.6  CL 110 106 107 107 106  CO2 21* 23 23 24 25   GLUCOSE 106* 139* 121* 111* 86  BUN 28* 24* 20 19 21*  CREATININE 2.13* 1.93* 1.63* 1.79* 1.85*  CALCIUM 7.9* 7.7* 7.7* 7.4* 7.4*   GFR: Estimated Creatinine Clearance: 38.4 mL/min (by C-G formula based on Cr of 1.85). Liver Function Tests:  Recent Labs Lab 10/11/15 1127  AST 36  ALT 22  ALKPHOS 56  BILITOT 1.1  PROT 7.8  ALBUMIN 3.4*   No results for input(s): LIPASE, AMYLASE in the last 168 hours. No results for input(s): AMMONIA in the last 168 hours. Coagulation Profile: No results for input(s): INR, PROTIME in the last 168 hours. Cardiac Enzymes: No results for input(s): CKTOTAL, CKMB, CKMBINDEX, TROPONINI in the last 168 hours. BNP (last 3 results) No results for input(s): PROBNP in the last 8760 hours. HbA1C: No results for input(s): HGBA1C in the last 72 hours. CBG: No  results for input(s): GLUCAP in the last 168 hours. Lipid Profile: No results for input(s): CHOL, HDL, LDLCALC, TRIG, CHOLHDL, LDLDIRECT in the last 72 hours. Thyroid Function Tests: No results for input(s): TSH, T4TOTAL, FREET4, T3FREE, THYROIDAB in the last 72 hours. Anemia Panel: No results for input(s): VITAMINB12, FOLATE, FERRITIN, TIBC, IRON, RETICCTPCT in the last 72 hours. Urine analysis:    Component Value Date/Time   COLORURINE YELLOW 08/08/2012 [place] 08/08/2012 1258   LABSPEC 1.011 08/08/2012 1258   PHURINE 7.0 08/08/2012 1258   GLUCOSEU NEGATIVE 08/08/2012 1258   HGBUR NEGATIVE 08/08/2012 1258   BILIRUBINUR NEGATIVE 08/08/2012 1258   KETONESUR NEGATIVE 08/08/2012 1258   PROTEINUR NEGATIVE 08/08/2012 1258   UROBILINOGEN 0.2 08/08/2012 1258   NITRITE NEGATIVE 08/08/2012 1258   LEUKOCYTESUR NEGATIVE 08/08/2012 1258   Sepsis Labs: @LABRCNTIP (procalcitonin:4,lacticidven:4)   )No results found for this or any previous visit (from the past 240 hour(s)).    Radiology Studies: Mr Jeri Cos [facility] Contrast 10/11/2015  1. Mild further enlargement of  the region of left parietal signal abnormality with small amount of petechial enhancement. 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 Subacute infarct would be expected to have shown noticeable improvement in this timeframe, even if there had been some additional ischemia in the interim. Cerebritis could also give this appearance though does not fit the clinical presentation. 2. Mild left parietal cortical diffusion abnormality likely reflects recent seizure activity.   Dg Chest Portable 1 View 10/11/2015 No acute abnormalities. Electronically Signed   By: Lavonia Dana M.D.   On: 10/11/2015 12:57      Scheduled Meds: . amLODipine  10 mg Oral Daily  . aspirin EC  81 mg Oral Daily  . atorvastatin  40 mg  Oral q1800  . cloBAZam  20 mg Oral BID  . cloNIDine  0.2 mg Oral BID  . clopidogrel  75 mg Oral Daily  . gabapentin  300 mg Oral TID  . heparin  5,000 Units Subcutaneous BID  . isosorbide mononitrate  90 mg Oral Daily  . labetalol  200 mg Oral BID  . lacosamide (VIMPAT) IV  200 mg Intravenous Q12H  . levETIRAcetam  750 mg Oral Q12H  . pantoprazole  40 mg Oral Daily  . polyethylene glycol  17 g Oral Daily   Continuous Infusions:     LOS: 6 days    Time spent: 25 minutes Greater than 50% of the time spent on counseling and coordinating the care.   Domenic Polite, MD [facility] Pager [phone]  If 7PM-7AM, please contact night-coverage [email or web address] Password [facility] 10/17/2015, 12:53 PM

## 2015-10-18 NOTE — Progress Notes (Signed)
Patient ID: Paul Mullins, male   DOB: 03/29/1951, 65 y.o.   MRN: [id number]  PROGRESS NOTE    Jonath Derrington  [id number] DOB: September 21, 1950 DOA: 10/11/2015  PCP: Alfonso Patten, MD   Brief Narrative:  65 -year-old male with past medical history of seizures, hypertension, coronary artery disease, chronic systolic CHF, status post right femoral-popliteal bypass in 2014. [facility] the day prior to the admission for weakness and confusion but subsequently discharged home. He then presented back to the hospital because of complains of distal right leg shaking and worsening weakness with fall. He was seen by neurology in consultation. They recommended Keppra in addition to Vimpat. MRI with and without contrast showed white matter edema, concerning for neoplasm.   Assessment & Plan:   Principal Problem: Encephalopathy - due to seizures, possible malignancy as noted on MRI of the brain with and without contrast - Appreciate Neurosurgery consult, ? 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 - i called and updated pts sister Ms.Warren ([phone] or [phone]).. pt and sister seemed to agreeable with recommendations for biopsy if needed, but she needs to d/w Dr.Stern further -Please note: he had a CT chest Abd and pelvis on 10/07/15 at [facility] in [place]: In Care Everywhere:  (IMPRESSION: 1. No acute thoracic or abdominal process. 2. Mediastinal and bilateral inguinal adenopathy, partially improved from earlier studies, possibly reactive but nonspecific. 3. Atherosclerosis, including Aortic  Atherosclerosis) -remains stable and improved now from a seizure standpoint  Essential hypertension - Continue Norvasc 10 mg daily, clonidine 0.2 mg twice daily, isosorbide 90 mg daily, labetalol 200 mg twice daily  Dyslipidemia - Continue Lipitor 40 mg daily  Coronary artery disease, native artery without angina pectoris - Continue aspirin and Plavix  Chronic kidney disease stage III - Baseline creatinine 1.4 few months prior to this admission - Creatinine 2.05 on this admission and further up to 2.13 but fortunately improving to 1.93 this am  - Not on any nephrotoxic medications - now in 1.6-1.7range  Anemia of chronic kidney disease  - Hemoglobin stable  Chronic systolic CHF -resumed PO lasix  DVT prophylaxis: Heparin subQ  Code Status: full code  Family Communication: no family at the bedside, called and d/w sister Ms.Cletus Gash Disposition Plan: SNF depending on Neurosurgery plans  Consultants:   Neurology   Neurosurgery  Procedures:   EEG 6/26 - pending   Antimicrobials:   None    Subjective: Feels better., no complaints  Objective: Filed Vitals:   10/17/15 0613 10/17/15 1141 10/17/15 1955 10/18/15 0546  BP: 157/64 110/69 136/59 146/68  Pulse: 86 93 89 91  Temp: 98.4 F (36.9 C) 98.2 F (36.8 C) 97.8 F (36.6 C) 98.5 F (36.9 C)  TempSrc: Oral Oral Oral Oral  Resp: 16 18 18 16   Height:      Weight: 81.647 kg (180 lb)   85.458 kg (188 lb 6.4 oz)  SpO2: 98% 100% 100% 98%    Intake/Output Summary (Last 24 hours) at 10/18/15 1256 Last data filed at 10/18/15 0900  Gross per 24 hour  Intake   1200 ml  Output   2925 ml  Net  -1725 ml   Filed Weights   10/16/15 0540  10/17/15 0613 10/18/15 0546  Weight: 80.468 kg (177 lb 6.4 oz) 81.647 kg (180 lb) 85.458 kg (188 lb 6.4 oz)    Examination:  General exam: Appears calm and comfortable, no distress, AAOx2 Respiratory system: Clear to auscultation. Respiratory effort normal. Cardiovascular system:  S1 & S2 heard, RRR. No JVD, murmurs, rubs, gallops or clicks. 1 plus edema. Gastrointestinal system: Abdomen is nondistended, soft and nontender. No organomegaly or masses felt. Normal bowel sounds heard. Central nervous system: Alert and oriented. No focal neurological deficits. Extremities: Symmetric 5 x 5 power. Skin: No rashes, lesions or ulcers Psychiatry: Judgement and insight appear normal. Mood & affect appropriate.   Data Reviewed: I have personally reviewed following labs and imaging studies  CBC:  Recent Labs Lab 10/12/15 0541 10/13/15 0456 10/14/15 0550 10/15/15 0254 10/16/15 0554  WBC 4.8 4.0 7.5 5.7 5.9  NEUTROABS 1.7  --   --   --   --   HGB 11.0* 10.1* 10.1* 10.3* 10.4*  HCT 34.9* 32.1* 31.6* 32.7* 33.2*  MCV 92.3 92.0 91.1 95.1 96.5  PLT 197 217 216 227 [id number]   Basic Metabolic Panel:  Recent Labs Lab 10/12/15 0541 10/13/15 0456 10/14/15 0550 10/15/15 0254 10/16/15 0554  NA 139 136 139 137 139  K 3.9 4.1 3.8 3.6 3.6  CL 110 106 107 107 106  CO2 21* 23 23 24 25   GLUCOSE 106* 139* 121* 111* 86  BUN 28* 24* 20 19 21*  CREATININE 2.13* 1.93* 1.63* 1.79* 1.85*  CALCIUM 7.9* 7.7* 7.7* 7.4* 7.4*   GFR: Estimated Creatinine Clearance: 39.2 mL/min (by C-G formula based on Cr of 1.85). Liver Function Tests: No results for input(s): AST, ALT, ALKPHOS, BILITOT, PROT, ALBUMIN in the last 168 hours. No results for input(s): LIPASE, AMYLASE in the last 168 hours. No results for input(s): AMMONIA in the last 168 hours. Coagulation Profile: No results for input(s): INR, PROTIME in the last 168 hours. Cardiac Enzymes: No results for input(s): CKTOTAL, CKMB, CKMBINDEX, TROPONINI in the last 168 hours. BNP (last 3 results) No results for input(s): PROBNP in the last 8760 hours. HbA1C: No results for input(s): HGBA1C in the last 72 hours. CBG: No results for input(s): GLUCAP in the last 168 hours. Lipid Profile: No results for input(s): CHOL, HDL, LDLCALC, TRIG,  CHOLHDL, LDLDIRECT in the last 72 hours. Thyroid Function Tests: No results for input(s): TSH, T4TOTAL, FREET4, T3FREE, THYROIDAB in the last 72 hours. Anemia Panel: No results for input(s): VITAMINB12, FOLATE, FERRITIN, TIBC, IRON, RETICCTPCT in the last 72 hours. Urine analysis:    Component Value Date/Time   COLORURINE YELLOW 08/08/2012 [place] 08/08/2012 1258   LABSPEC 1.011 08/08/2012 1258   PHURINE 7.0 08/08/2012 1258   GLUCOSEU NEGATIVE 08/08/2012 1258   HGBUR NEGATIVE 08/08/2012 1258   BILIRUBINUR NEGATIVE 08/08/2012 1258   KETONESUR NEGATIVE 08/08/2012 1258   PROTEINUR NEGATIVE 08/08/2012 1258   UROBILINOGEN 0.2 08/08/2012 1258   NITRITE NEGATIVE 08/08/2012 1258   LEUKOCYTESUR NEGATIVE 08/08/2012 1258   Sepsis Labs: @LABRCNTIP (procalcitonin:4,lacticidven:4)   )No results found for this or any previous visit (from the past 240 hour(s)).    Radiology Studies: Mr Jeri Cos [facility] Contrast 10/11/2015  1. Mild further enlargement of the region of left parietal signal abnormality with small amount of petechial enhancement. 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 Subacute infarct would be expected to  have shown noticeable improvement in this timeframe, even if there had been some additional ischemia in the interim. Cerebritis could also give this appearance though does not fit the clinical presentation. 2. Mild left parietal cortical diffusion abnormality likely reflects recent seizure activity.   Dg Chest Portable 1 View 10/11/2015 No acute abnormalities. Electronically Signed   By: Lavonia Dana M.D.   On: 10/11/2015 12:57      Scheduled Meds: . amLODipine  10 mg Oral Daily  . aspirin EC  81 mg Oral Daily  . atorvastatin  40 mg Oral q1800  . cloBAZam  20 mg Oral BID  . cloNIDine  0.2 mg Oral BID  . clopidogrel  75 mg Oral Daily  . furosemide  20  mg Oral Daily  . gabapentin  300 mg Oral TID  . heparin  5,000 Units Subcutaneous BID  . isosorbide mononitrate  90 mg Oral Daily  . labetalol  200 mg Oral BID  . lacosamide (VIMPAT) IV  200 mg Intravenous Q12H  . levETIRAcetam  750 mg Oral Q12H  . pantoprazole  40 mg Oral Daily  . polyethylene glycol  17 g Oral Daily   Continuous Infusions:     LOS: 7 days    Time spent: 25 minutes Greater than 50% of the time spent on counseling and coordinating the care.   Domenic Polite, MD [facility] Pager [phone]  If 7PM-7AM, please contact night-coverage [email or web address] Password [facility] 10/18/2015, 12:56 PM

## 2015-10-19 ENCOUNTER — Inpatient Hospital Stay (HOSPITAL_COMMUNITY): Payer: Medicare HMO

## 2015-10-19 MED ORDER — GADOBENATE DIMEGLUMINE 529 MG/ML IV SOLN
12.0000 mL | Freq: Once | INTRAVENOUS | Status: AC | PRN
  Administered 2015-10-19: 12 mL via INTRAVENOUS

## 2015-10-19 MED ORDER — LACOSAMIDE 50 MG PO TABS
200.0000 mg | ORAL_TABLET | Freq: Two times a day (BID) | ORAL | Status: DC
  Administered 2015-10-19 – 2015-10-20 (×2): 200 mg via ORAL
  Filled 2015-10-19 (×3): qty 4

## 2015-10-19 NOTE — Clinical Social Work Note (Addendum)
Per admissions coordinator at [facility], insurance authorization has been started and family has finished admission paperwork.  [name], [name] [phone]  3:08 pm Per [facility]'s admissions coordinator, PT needs to see patient again ASAP for insurance authorization. MD paged.  [name], [place]

## 2015-10-19 NOTE — Progress Notes (Signed)
Patient ID: Paul Mullins, male   DOB: [birth year], 65 y.o.   MRN: [id number]  PROGRESS NOTE    Paul Mullins  [name] DOB: [birth year] DOA: 10/11/2015  PCP: Alfonso Patten, MD   Brief Narrative:  21 -year-old male with past medical history of seizures, hypertension, coronary artery disease, chronic systolic CHF, status post right femoral-popliteal bypass in 2014. [facility] day prior to the admission for weakness and confusion but subsequently discharged home. He then presented back to the hospital because of complains of distal right leg shaking and worsening weakness with fall. He was seen by neurology in consultation. They recommended Keppra in addition to Vimpat. MRI with and without contrast showed white matter edema, concerning for neoplasm.   Assessment & Plan:   Principal Problem: Encephalopathy - due to seizures, possible malignancy as noted on MRI of the brain with and without contrast - Appreciate Neurosurgery consult, ? Infiltrative primary tumor, recommended craniotomy/biopsy, I d/w pt and sister multiple times and they want to proceed with Biopsy, await timing per Dr.Stern, pls Note he is on ASA/Plavix which will need to be held prior - sister is next of kin: Ms.Warren ([phone] or Cell:[phone]). - mentation improved - PT evaluation, SNF at discharge, depending on NSG plans  Seizures / questionable brain malignancy  - Continue Keppra/Vimpat -started on  Clobazam per Neurology, finally seizures controlled -Please note: he had a CT chest Abd and pelvis on 10/07/15 at [facility] in [place]: In Care Everywhere:  (IMPRESSION: 1. No acute thoracic or abdominal process. 2. Mediastinal and bilateral inguinal adenopathy, partially improved from earlier studies, possibly reactive but nonspecific. 3. Atherosclerosis, including Aortic Atherosclerosis) -remains stable and improved now from a seizure  standpoint  Essential hypertension - Continue Norvasc 10 mg daily, clonidine 0.2 mg twice daily, isosorbide 90 mg daily, labetalol 200 mg twice daily  Dyslipidemia - Continue Lipitor 40 mg daily  Coronary artery disease, native artery without angina pectoris - On aspirin and Plavix  Chronic kidney disease stage III - Baseline creatinine 1.4 few months prior to this admission - Creatinine 2.05 on this admission and further up to 2.13 but fortunately improving to 1.93 this am  - Not on any nephrotoxic medications - now in 1.6-1.7range  Anemia of chronic kidney disease  - Hemoglobin stable  Chronic systolic CHF -resumed PO lasix  DVT prophylaxis: Heparin subQ  Code Status: full code  Family Communication: no family at the bedside, called and d/w sister Ms.Cletus Gash Disposition Plan: SNF depending on Neurosurgery plans  Consultants:   Neurology   Neurosurgery  Procedures:   EEG 6/26 - pending   Antimicrobials:   None    Subjective: Feels better., no complaints  Objective: Filed Vitals:   10/18/15 2019 10/19/15 0653 10/19/15 0903 10/19/15 0916  BP: 135/60 158/64 150/64 150/64  Pulse: 89 95 93 93  Temp: 97.6 F (36.4 C) 98.4 F (36.9 C) 97.8 F (36.6 C)   TempSrc: Oral Oral Oral   Resp: 18 20 19    Height:      Weight:  81.92 kg (180 lb 9.6 oz)    SpO2: 98% 100% 100%     Intake/Output Summary (Last 24 hours) at 10/19/15 1110 Last data filed at 10/19/15 1010  Gross per 24 hour  Intake   1650 ml  Output   1350 ml  Net    300 ml   Filed Weights   10/17/15 0613 10/18/15 0546 10/19/15 0653  Weight: 81.647  kg (180 lb) 85.458 kg (188 lb 6.4 oz) 81.92 kg (180 lb 9.6 oz)    Examination:  General exam: Appears calm and comfortable, no distress, AAOx2 Respiratory system: Clear to auscultation. Respiratory effort normal. Cardiovascular system: S1 & S2 heard, RRR. No JVD, murmurs, rubs, gallops or clicks. 1 plus edema. Gastrointestinal system: Abdomen is  nondistended, soft and nontender. No organomegaly or masses felt. Normal bowel sounds heard. Central nervous system: Alert and oriented. No focal neurological deficits. Extremities: Symmetric 5 x 5 power. Skin: No rashes, lesions or ulcers Psychiatry: Judgement and insight appear normal. Mood & affect appropriate.   Data Reviewed: I have personally reviewed following labs and imaging studies  CBC:  Recent Labs Lab 10/13/15 0456 10/14/15 0550 10/15/15 0254 10/16/15 0554  WBC 4.0 7.5 5.7 5.9  HGB 10.1* 10.1* 10.3* 10.4*  HCT 32.1* 31.6* 32.7* 33.2*  MCV 92.0 91.1 95.1 96.5  PLT 217 216 227 [id number]   Basic Metabolic Panel:  Recent Labs Lab 10/13/15 0456 10/14/15 0550 10/15/15 0254 10/16/15 0554  NA 136 139 137 139  K 4.1 3.8 3.6 3.6  CL 106 107 107 106  CO2 23 23 24 25   GLUCOSE 139* 121* 111* 86  BUN 24* 20 19 21*  CREATININE 1.93* 1.63* 1.79* 1.85*  CALCIUM 7.7* 7.7* 7.4* 7.4*   GFR: Estimated Creatinine Clearance: 38.5 mL/min (by C-G formula based on Cr of 1.85). Liver Function Tests: No results for input(s): AST, ALT, ALKPHOS, BILITOT, PROT, ALBUMIN in the last 168 hours. No results for input(s): LIPASE, AMYLASE in the last 168 hours. No results for input(s): AMMONIA in the last 168 hours. Coagulation Profile: No results for input(s): INR, PROTIME in the last 168 hours. Cardiac Enzymes: No results for input(s): CKTOTAL, CKMB, CKMBINDEX, TROPONINI in the last 168 hours. BNP (last 3 results) No results for input(s): PROBNP in the last 8760 hours. HbA1C: No results for input(s): HGBA1C in the last 72 hours. CBG: No results for input(s): GLUCAP in the last 168 hours. Lipid Profile: No results for input(s): CHOL, HDL, LDLCALC, TRIG, CHOLHDL, LDLDIRECT in the last 72 hours. Thyroid Function Tests: No results for input(s): TSH, T4TOTAL, FREET4, T3FREE, THYROIDAB in the last 72 hours. Anemia Panel: No results for input(s): VITAMINB12, FOLATE, FERRITIN, TIBC, IRON,  RETICCTPCT in the last 72 hours. Urine analysis:    Component Value Date/Time   COLORURINE YELLOW 08/08/2012 [place] 08/08/2012 1258   LABSPEC 1.011 08/08/2012 1258   PHURINE 7.0 08/08/2012 1258   GLUCOSEU NEGATIVE 08/08/2012 1258   HGBUR NEGATIVE 08/08/2012 1258   BILIRUBINUR NEGATIVE 08/08/2012 1258   KETONESUR NEGATIVE 08/08/2012 1258   PROTEINUR NEGATIVE 08/08/2012 1258   UROBILINOGEN 0.2 08/08/2012 1258   NITRITE NEGATIVE 08/08/2012 1258   LEUKOCYTESUR NEGATIVE 08/08/2012 1258   Sepsis Labs: @LABRCNTIP (procalcitonin:4,lacticidven:4)   )No results found for this or any previous visit (from the past 240 hour(s)).    Radiology Studies: Mr Jeri Cos [facility] Contrast 10/11/2015  1. Mild further enlargement of the region of left parietal signal abnormality with small amount of petechial enhancement. 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 Subacute infarct would be expected to have shown noticeable improvement in this timeframe, even if there had been some additional ischemia in the interim. Cerebritis could also give this appearance though does not fit the clinical presentation. 2. Mild left parietal cortical diffusion abnormality likely  reflects recent seizure activity.   Dg Chest Portable 1 View 10/11/2015 No acute abnormalities. Electronically Signed   By: Lavonia Dana M.D.   On: 10/11/2015 12:57      Scheduled Meds: . amLODipine  10 mg Oral Daily  . aspirin EC  81 mg Oral Daily  . atorvastatin  40 mg Oral q1800  . cloBAZam  20 mg Oral BID  . cloNIDine  0.2 mg Oral BID  . clopidogrel  75 mg Oral Daily  . furosemide  20 mg Oral Daily  . gabapentin  300 mg Oral TID  . heparin  5,000 Units Subcutaneous BID  . isosorbide mononitrate  90 mg Oral Daily  . labetalol  200 mg Oral BID  . lacosamide  200 mg Oral Q12H  . levETIRAcetam  750 mg Oral Q12H  .  pantoprazole  40 mg Oral Daily  . polyethylene glycol  17 g Oral Daily   Continuous Infusions:     LOS: 8 days    Time spent: 25 minutes Greater than 50% of the time spent on counseling and coordinating the care.   Domenic Polite, MD [facility] Pager [phone]  If 7PM-7AM, please contact night-coverage [email or web address] Password [email or web address] 10/19/2015, 11:10 AM

## 2015-10-19 NOTE — Progress Notes (Signed)
Patient ID: Paul Mullins, male   DOB: [birth year], 65 y.o.   MRN: [id number]  Spoke with pt, sister Paul Mullins, and nephew Paul Mullins about situation and Dr. Melven Sartorius recommendations. BrainLab MRI can be scheduled for today or tomorrow, followed by d/c to SNF per Medicine.  Office f/u in the next couple weeks to discuss surgery. Surgery within the next 2-3 weeks for left parietal craniotomy for biopsy of lesion. (Unable to remove "a tumor" as the lesion is infiltrative). Biopsy results will determine treatment options. Pt & family verbalize understanding and agree to proceed with plan.  Order entered for MRI Brainlab protocol.   Verdis Prime RN BSN

## 2015-10-19 NOTE — Progress Notes (Signed)
Physical Therapy Treatment Patient Details Name: Paul Mullins MRN: [id number] DOB: Apr 18, 1951 Today's Date: 10/19/2015    History of Present Illness Pt is a 65 y/o M who presented w/ c/o distal Rt LE shaking and worsening weakness w/ fall.  MRI showed white matter edema, concerning for neoplasm.  Of note, pt ran out of his Keppra a few days prior to admission.  Pt's PMH includes neck surgery, Rt fem-pop bypass, PVD, CHF.    PT Comments    Pt OOB in recliner feeling "okay".  Assisted with amb a limited distance due to weakness and fatigue.  Very unsteady gait with poor self corrective balance and shuffled feet.  HIGH FALL RISK. Pt will need ST Rehab at SNF.  Follow Up Recommendations  SNF     Equipment Recommendations  None recommended by PT    Recommendations for Other Services       Precautions / Restrictions Precautions Precautions: Fall;Other (comment) Precaution Comments: ; Seizure Restrictions Weight Bearing Restrictions: No    Mobility  Bed Mobility               General bed mobility comments: OOB in recliner  Transfers Overall transfer level: Needs assistance Equipment used: Rolling walker (2 wheeled) Transfers: Sit to/from Stand Sit to Stand: Min assist         General transfer comment: required increased assist and VC's on safety with proper hand placement.    Ambulation/Gait Ambulation/Gait assistance: Min assist;Mod assist Ambulation Distance (Feet): 28 Feet Assistive device: Rolling walker (2 wheeled) Gait Pattern/deviations: Step-to pattern;Decreased step length - left;Trunk flexed Gait velocity: decreased   General Gait Details: very unsteady gait with decreased stance time R LE and poor forward flex posture.  Shuffled gait.  HIGH FALL RISK.  Limited activity tolerance.     Stairs            Wheelchair Mobility    Modified Rankin (Stroke Patients Only)       Balance                                     Cognition   Behavior During Therapy: WFL for tasks assessed/performed Overall Cognitive Status: Within Functional Limits for tasks assessed                      Exercises      General Comments        Pertinent Vitals/Pain Pain Assessment: No/denies pain    Home Living                      Prior Function            PT Goals (current goals can now be found in the care plan section) Progress towards PT goals: Progressing toward goals    Frequency       PT Plan Current plan remains appropriate    Co-evaluation             End of Session Equipment Utilized During Treatment: Gait belt Activity Tolerance: Patient limited by fatigue Patient left: in chair;with call bell/phone within reach;with chair alarm set     Time: [id number] PT Time Calculation (min) (ACUTE ONLY): 33 min  Charges:  $Therapeutic Activity: 8-22 mins                    G Codes:      Cecille Rubin  Brandon Melnick  PTA [facility]  Acute  Rehab Pager      [phone]

## 2015-10-19 NOTE — Progress Notes (Signed)
Patient ID: Paul Mullins, male   DOB: 1950-08-10, 65 y.o.   MRN: [id number] Pt sitting up in chair eating crackers, conversant.  During our discussion of plan for brain biopsy, he reports burning skin irritation, stating bumps appear whenever he scratches his skin. In discussing family, he reports a sister lives in [place] and [place] have visited Saturday. Other family lives in [place]. Nurse reports family did not show for scheduled meeting to discuss plans.  Will discuss with Dr. Vertell Limber inpatient vs. outpatient  brain lesion follow up.    Verdis Prime RN BSN

## 2015-10-19 NOTE — Progress Notes (Signed)
   10/19/15 1100  Clinical Encounter Type  Visited With Patient and family together  Visit Type Follow-up  Referral From Social work  Consult/Referral To Stage manager and notarization of AD. Roe Coombs 10/19/2015

## 2015-10-19 NOTE — Care Management Important Message (Signed)
Important Message  Patient Details  Name: Paul Mullins MRN: [id number] Date of Birth: 01-Dec-1950   Medicare Important Message Given:  Yes    Paul Mullins 10/19/2015, 10:52 AM

## 2015-10-19 NOTE — Progress Notes (Signed)
   10/19/15 0900  Clinical Encounter Type  Visited With Patient  Visit Type Follow-up  Referral From Chaplain  Consult/Referral To Chaplain  Stress Factors  Patient Stress Factors Loss of control  CHP visited with patient and offered ministry of presence, emotional support and prayer.  Patient was very sad over loss of girlfriend in April and brother in June. CHP will follow up again later today. Roe Coombs 10/19/2015

## 2015-10-19 NOTE — Clinical Social Work Note (Signed)
Patient has insurance authorization to discharge tomorrow.  [name], [place]

## 2015-10-20 MED ORDER — LACOSAMIDE 200 MG PO TABS
200.0000 mg | ORAL_TABLET | Freq: Two times a day (BID) | ORAL | Status: DC
Start: 2015-10-20 — End: 2015-11-11

## 2015-10-20 MED ORDER — FUROSEMIDE 20 MG PO TABS: 20.0000 mg | ORAL_TABLET | Freq: Every day | ORAL | Status: DC

## 2015-10-20 MED ORDER — POLYETHYLENE GLYCOL 3350 17 G PO PACK: 17.0000 g | PACK | Freq: Every day | ORAL | Status: DC | PRN

## 2015-10-20 MED ORDER — LEVETIRACETAM 750 MG PO TABS: 750.0000 mg | ORAL_TABLET | Freq: Two times a day (BID) | ORAL | Status: DC

## 2015-10-20 MED ORDER — CLOBAZAM 20 MG PO TABS
20.0000 mg | ORAL_TABLET | Freq: Two times a day (BID) | ORAL | Status: DC
Start: 2015-10-20 — End: 2015-11-11

## 2015-10-20 NOTE — Progress Notes (Signed)
Report called to RN at [facility]. Pt trans  Via ambulance- no seizure activity noted, pt in no distress

## 2015-10-20 NOTE — Progress Notes (Addendum)
Pts. Grandson, Damorius Deatra Canter, here with questions about pts. Care received during visit. Family wanting to know where pt. Discharged to stating he is pts. Healthcare POA. POA documents provided on this day 10/20/2015. Family member unable to provide identification to go along with POA. Staff unable to provide family member with requested documents at this time and instructed to return to medical records tomorrow for requested documents.

## 2015-10-20 NOTE — Clinical Social Work Placement (Signed)
   CLINICAL SOCIAL WORK PLACEMENT  NOTE  Date:  10/20/2015  Patient Details  Name: Paul Mullins MRN: [id number] Date of Birth: 1951-02-18  Clinical Social Work is seeking post-discharge placement for this patient at the [place] level of care (*CSW will initial, date and re-position this form in  chart as items are completed):  Yes   Patient/family provided with [facility] Work Department's list of facilities offering this level of care within the geographic area requested by the patient (or if unable, by the patient's family).  Yes   Patient/family informed of their freedom to choose among providers that offer the needed level of care, that participate in Medicare, Medicaid or managed care program needed by the patient, have an available bed and are willing to accept the patient.  Yes   Patient/family informed of [facility]'s ownership interest in [facility] and [facility], as well as of the fact that they are under no obligation to receive care at these facilities.  PASRR submitted to EDS on 10/13/15     PASRR number received on 10/13/15     Existing PASRR number confirmed on       FL2 transmitted to all facilities in geographic area requested by pt/family on 10/13/15     FL2 transmitted to all facilities within larger geographic area on       Patient informed that his/her managed care company has contracts with or will negotiate with certain facilities, including the following:        Yes   Patient/family informed of bed offers received.  Patient chooses bed at [facility]     Physician recommends and patient chooses bed at      Patient to be transferred to [facility] on 10/20/15.  Patient to be transferred to facility by [facility]     Patient family notified on 10/20/15 of transfer.  Name of family member notified:  Patient told [facility]       Additional Comment:     _______________________________________________ Benard Halsted, [place] 10/20/2015, 12:06 PM

## 2015-10-20 NOTE — Discharge Summary (Addendum)
Physician Discharge Summary  Paul Mullins [name] DOB: 06/12/1950 DOA: 10/11/2015  PCP: Alfonso Patten, MD  Admit date: 10/11/2015 Discharge date: 10/20/2015  Time spent: 45 minutes  Recommendations for Outpatient Follow-up:  1. Dr.Oluwaseun Bruyere Vertell Limber in 2 weeks for scheduling Craniotomy and Brain Biopsy 2. 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   Hypertensive urgency     PVD (peripheral vascular disease) (HCC)   Status epilepticus (HCC)   AKI (acute kidney injury) ([place])   CKD (chronic kidney disease)   Seizure (HCC)   Chronic diastolic congestive heart failure (HCC)   Anemia of chronic disease   Neoplasm   Discharge Condition: stable  Diet recommendation:heart healthy  Filed Weights   10/18/15 0546 10/19/15 0653 10/20/15 0446  Weight: 85.458 kg (188 lb 6.4 oz) 81.92 kg (180 lb 9.6 oz) 81.784 kg (180 lb 4.8 oz)    History of present illness:  1 -year-old male with past medical history of seizures, hypertension, coronary artery disease, chronic systolic CHF, status post right femoral-popliteal bypass in 2014. [facility] the day prior to the admission for weakness and confusion but subsequently discharged home. He then presented back to the hospital because of complains of distal right leg shaking and worsening weakness with fall. MRI with and without contrast showed white matter edema, concerning for neoplasm.  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 - sister is next of kin: Ms.Warren ([id number] or [id number]). - mentation improved - PT evaluation completed, [facility]  Seizures  -due to above malignancy - followed by neurology this admission, his home Keppra dose was increased from 500 to 750 twice a day he was also started on Vimpat and clobazam per neurology, finally seizures controlled on this regimen, he was primarily having partial seizures involving his right lower extremity- -remains stable and improved now from a seizure standpoint  Essential hypertension - Continue Norvasc 10 mg daily, clonidine 0.2 mg twice daily, isosorbide 90 mg daily, labetalol 200 mg twice daily  Dyslipidemia - Continue Lipitor 40 mg daily  Coronary artery disease, native artery without angina pectoris - On aspirin and Plavix  Chronic kidney disease stage III - Baseline creatinine 1.4 few months prior to this admission - Creatinine 2.05 on this admission and further up to 2.13  - Not on any nephrotoxic medications - now stable in 1.6-1.7range  Anemia of chronic kidney disease  - Hemoglobin stable  Chronic diastolic CHF -resumed PO lasix- 20mg  stable  Consultations:  Neurology  Neurosurgery Dr.Stern  Discharge Exam: Filed Vitals:   10/20/15 0446 10/20/15 1150  BP: 143/64 155/72  Pulse: 91 96  Temp: 98 F (36.7 C) 98.5 F (36.9 C)  Resp: 18 20    General: AAOx3 Cardiovascular: S1S2/RRR Respiratory: CTAB  Discharge Instructions   Discharge Instructions    Diet - low sodium heart healthy    Complete by:  As directed      Increase activity slowly  Complete by:  As directed           Current Discharge Medication List    START taking these medications   Details  cloBAZam (ONFI) 20 MG tablet Take 1 tablet (20 mg total) by mouth 2 (two) times daily.    furosemide (LASIX) 20 MG tablet Take 1 tablet (20 mg total) by mouth  daily.    lacosamide (VIMPAT) 200 MG TABS tablet Take 1 tablet (200 mg total) by mouth every 12 (twelve) hours.    polyethylene glycol (MIRALAX / GLYCOLAX) packet Take 17 g by mouth daily as needed. Qty: 14 each, Refills: 0      CONTINUE these medications which have CHANGED   Details  levETIRAcetam (KEPPRA) 750 MG tablet Take 1 tablet (750 mg total) by mouth 2 (two) times daily.      CONTINUE these medications which have NOT CHANGED   Details  amLODipine (NORVASC) 10 MG tablet Take 10 mg by mouth daily.    aspirin EC 81 MG tablet Take 81 mg by mouth daily.    atorvastatin (LIPITOR) 40 MG tablet Take 40 mg by mouth daily.    cloNIDine (CATAPRES) 0.2 MG tablet Take 0.2 mg by mouth 2 (two) times daily.     clopidogrel (PLAVIX) 75 MG tablet Take 75 mg by mouth daily.    gabapentin (NEURONTIN) 300 MG capsule Take 300 mg by mouth 3 (three) times daily.    isosorbide mononitrate (IMDUR) 30 MG 24 hr tablet Take 90 mg by mouth daily.    labetalol (NORMODYNE) 200 MG tablet Take 200 mg by mouth 2 (two) times daily.    nitroGLYCERIN (NITROSTAT) 0.4 MG SL tablet Place 0.4 mg under the tongue every 5 (five) minutes as needed for chest pain.    pantoprazole (PROTONIX) 20 MG tablet Take 40 mg by mouth daily.       Allergies  Allergen Reactions  . Ace Inhibitors Other (See Comments)    Impaired  Kidney Function   Follow-up Information    Follow up with [facility].   Specialty:  [place] information:   [place] [phone]      Follow up with Peggyann Shoals, MD In 2 weeks.   Specialty:  Neurosurgery   Why:  for scheduling Craniotomy and brain biopsy   Contact information:   [address] [place] [address] [phone]        The results of significant diagnostics from this hospitalization (including imaging, microbiology, ancillary and laboratory) are listed  below for reference.    Significant Diagnostic Studies: Mr Kizzie Fantasia Contrast  11-15-15  CLINICAL DATA:  Continued surveillance LEFT parietal lesion. Surgical planning. EXAM: MRI HEAD WITHOUT AND WITH CONTRAST TECHNIQUE: Multiplanar, multiecho pulse sequences of the brain and surrounding structures were obtained without and with intravenous contrast. CONTRAST:  72mL MULTIHANCE GADOBENATE DIMEGLUMINE 529 MG/ML IV SOLN COMPARISON:  Multiple priors, including previous MRI examinations from 2016. FINDINGS: Moderate-sized area of T2 and FLAIR hyperintensity, involving the LEFT parietal cortex and subcortical white matter, straddling the PCA and MCA vascular territories, no significant restriction, developing vasogenic edema, gyriform swelling, petechial hemorrhage, with a paucity of enhancement. Primary brain tumor is favored. The prolonged time course and progressive worsening of the imaging findings are not consistent with vascular disease or metastasis. Abnormal T2 signal cross-section measures 31 x 48 mm on image 17 series 5. No significant change most recent MR of  10/11/2015. 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 Electronically Signed   By: Staci Righter M.D.   On: 10/19/2015 20:14   Mr Jeri Cos [id number] Contrast  10/11/2015  CLINICAL DATA:  Seizures. EXAM: MRI HEAD WITHOUT AND WITH CONTRAST TECHNIQUE: Multiplanar, multiecho pulse sequences of the brain and surrounding structures were obtained without and with intravenous contrast. CONTRAST:  44mL MULTIHANCE GADOBENATE DIMEGLUMINE 529 MG/ML IV SOLN COMPARISON:  Head CT 10/07/2015 and MRI 08/19/2015 FINDINGS: Confluent region of T2 hyperintensity/edema in the left parietal lobe and posterior cingulate gyrus has mildly progressed from the prior MRI, again with mild mass effect with sulcal effacement. Diffusion is facilitated, and this extends over approximately 7 mm. There is new left parietal cortical trace diffusion signal  abnormality at the margins of this region with relatively normal ADC. Small foci of susceptibility artifact within this region are unchanged and compatible with remote hemorrhages. There is some mildly prominent leptomeningeal vascular enhancement in this region as well as some non masslike petechial parenchymal enhancement. Elsewhere, there is no evidence of acute infarct. No extra-axial fluid collection or midline shift. Slight mass effect on the left lateral ventricle from the left parietal abnormality, with the ventricles otherwise normal in size. Scattered, small foci of T2 hyperintensity elsewhere in the cerebral white matter bilaterally are unchanged and nonspecific but compatible with mild chronic small vessel ischemic disease. No abnormal enhancement is identified elsewhere. Orbits are unremarkable. Mild mucosal thickening is noted in the paranasal sinuses, and there is a small chronic right mastoid effusion. Major intracranial vascular flow voids are unchanged. IMPRESSION: 1. Mild further enlargement of the region of left parietal signal abnormality with small amount of petechial enhancement. 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 Subacute infarct would be expected to have shown noticeable improvement in this timeframe, even if there had been some additional ischemia in the interim. Cerebritis could also give this appearance though does not fit the clinical presentation. 2. Mild left parietal cortical diffusion abnormality likely reflects recent seizure activity. Electronically Signed   By: Logan Bores M.D.   On: 10/11/2015 16:35   Dg Chest Portable 1 View  10/11/2015  CLINICAL DATA:  Seizure like activity, mid sternal chest pain, recent slightly productive cough for 1 week, history hypertension, CHF, coronary artery disease, VSD EXAM: PORTABLE CHEST 1 VIEW COMPARISON:  Portable exam  0033 hours compared to 08/19/2015 FINDINGS: Normal heart size, mediastinal contours, and pulmonary vascularity. Lungs clear. No pleural effusion or pneumothorax. Bones unremarkable. IMPRESSION: No acute abnormalities. Electronically Signed   By: Lavonia Dana M.D.   On: 10/11/2015 12:57    Microbiology: No results found for this or any previous visit (from the past 240 hour(s)).   Labs: Basic Metabolic Panel:  Recent Labs Lab 10/14/15 0550 10/15/15 0254 10/16/15 0554  NA 139 137 139  K 3.8 3.6 3.6  CL 107 107 106  CO2 23 24 25   GLUCOSE 121* 111* 86  BUN 20 19 21*  CREATININE 1.63* 1.79* 1.85*  CALCIUM 7.7* 7.4* 7.4*   Liver Function Tests: No results for input(s): AST, ALT, ALKPHOS, BILITOT, PROT, ALBUMIN in the last 168 hours. No results for input(s): LIPASE, AMYLASE in the last 168 hours. No results for input(s): AMMONIA in the last 168 hours. CBC:  Recent Labs Lab 10/14/15 0550 10/15/15 0254 10/16/15 0554  WBC 7.5 5.7 5.9  HGB 10.1* 10.3* 10.4*  HCT 31.6* 32.7* 33.2*  MCV 91.1 95.1 96.5  PLT 216 227 252   Cardiac Enzymes: No results for input(s): CKTOTAL, CKMB, CKMBINDEX, TROPONINI in the last 168 hours. BNP: BNP (last 3 results) No results for input(s): BNP in the last 8760 hours.  ProBNP (last 3 results) No results for input(s): PROBNP in the last 8760 hours.  CBG: No results for input(s): GLUCAP in the last 168 hours.     SignedDomenic Polite MD.  [facility] 10/20/2015, 11:52 AM

## 2015-10-20 NOTE — Progress Notes (Signed)
Patient will DC to: [facility] Anticipated DC date: 10/20/15 Family notified: N/A Transport by: Veleta Miners   Per MD patient ready for DC to [facility]. RN, patient, patient's family, and facility notified of DC. RN given number for report. DC packet on chart. Ambulance transport requested for patient.   CSW signing off.  Cedric Fishman, [place] Social Worker [phone]

## 2015-10-20 NOTE — Progress Notes (Signed)
Physical Therapy Treatment Patient Details Name: Paul Mullins MRN: [id number] DOB: [birth year] Today's Date: 10/20/2015    History of Present Illness Pt is a 65 y/o M who presented w/ c/o distal Rt LE shaking and worsening weakness w/ fall.  MRI showed white matter edema, concerning for neoplasm.  Of note, pt ran out of his Keppra a few days prior to admission.  Pt's PMH includes neck surgery, Rt fem-pop bypass, PVD, CHF.    PT Comments    Patient progressing slowly due to weakness on L and poor coordination.  Patient seems well aware of his limitations this session and that he is discharging to [facility] this afternoon.  Will benefit from SNF rehab to progress mobility/ambulation and independence prior to d/c home.   Follow Up Recommendations  SNF     Equipment Recommendations  None recommended by PT    Recommendations for Other Services       Precautions / Restrictions Precautions Precautions: Fall Precaution Comments: seizure    Mobility  Bed Mobility Overal bed mobility: Needs Assistance       Supine to sit: Min assist     General bed mobility comments: slow, but able to get legs off bed, min A for scooting out to EOB  Transfers Overall transfer level: Needs assistance Equipment used: Rolling walker (2 wheeled) Transfers: Sit to/from Stand Sit to Stand: Mod assist         General transfer comment: lifting assist up from EOB, difficulty getting R leg under him until standing with noted edema  Ambulation/Gait Ambulation/Gait assistance: Min assist Ambulation Distance (Feet): 3 Feet Assistive device: Rolling walker (2 wheeled) Gait Pattern/deviations: Shuffle;Step-to pattern     General Gait Details: difficulty progressing R LE with stand step to chair with RW; increased time to move L foot sliding on floor and assist for balance, cues for technique   Stairs            Wheelchair Mobility    Modified Rankin (Stroke Patients Only)        Balance Overall balance assessment: Needs assistance     Sitting balance - Comments: bilateral UE support leaning back with assist for urinal while urinating   Standing balance support: Bilateral upper extremity supported Standing balance-Leahy Scale: Poor Standing balance comment: UE support needed in standing                    Cognition Arousal/Alertness: Awake/alert Behavior During Therapy: WFL for tasks assessed/performed Overall Cognitive Status: Within Functional Limits for tasks assessed                      Exercises General Exercises - Lower Extremity Ankle Circles/Pumps: PROM;Left;5 reps;Supine    General Comments        Pertinent Vitals/Pain Pain Assessment: Faces Faces Pain Scale: Hurts little more Pain Location: R leg Pain Descriptors / Indicators: Sore Pain Intervention(s): Monitored during session;Repositioned    Home Living                      Prior Function            PT Goals (current goals can now be found in the care plan section) Progress towards PT goals: Progressing toward goals (limited )    Frequency  Min 3X/week    PT Plan Current plan remains appropriate    Co-evaluation             End of Session Equipment Utilized  During Treatment: Gait belt Activity Tolerance: Patient limited by fatigue Patient left: with call bell/phone within reach;with chair alarm set     Time: 1151-1209 PT Time Calculation (min) (ACUTE ONLY): 18 min  Charges:  $Therapeutic Activity: 8-22 mins                    G Codes:      Reginia Naas November 02, 2015, 12:24 PM  Magda Kiel, [place] 11/02/2015

## 2015-10-28 ENCOUNTER — Other Ambulatory Visit (HOSPITAL_COMMUNITY): Payer: Self-pay | Admitting: Neurosurgery

## 2015-10-28 DIAGNOSIS — G939 Disorder of brain, unspecified: Secondary | ICD-10-CM

## 2015-11-02 ENCOUNTER — Ambulatory Visit (HOSPITAL_COMMUNITY)
Admission: RE | Admit: 2015-11-02 | Discharge: 2015-11-02 | Disposition: A | Discharge status: Home / Self Care | Payer: Medicare HMO | Source: Ambulatory Visit | Attending: Neurosurgery | Admitting: Neurosurgery

## 2015-11-02 DIAGNOSIS — M7989 Other specified soft tissue disorders: Secondary | ICD-10-CM | POA: Diagnosis not present

## 2015-11-02 DIAGNOSIS — G939 Disorder of brain, unspecified: Secondary | ICD-10-CM

## 2015-11-02 DIAGNOSIS — I130 Hypertensive heart and chronic kidney disease with heart failure and stage 1 through stage 4 chronic kidney disease, or unspecified chronic kidney disease: Secondary | ICD-10-CM | POA: Diagnosis not present

## 2015-11-02 MED ORDER — IOPAMIDOL (ISOVUE-300) INJECTION 61%
INTRAVENOUS | Status: AC
  Administered 2015-11-02: 60 mL
  Filled 2015-11-02: qty 75

## 2015-11-03 ENCOUNTER — Other Ambulatory Visit: Payer: Self-pay | Admitting: Neurosurgery

## 2015-11-04 ENCOUNTER — Emergency Department (HOSPITAL_BASED_OUTPATIENT_CLINIC_OR_DEPARTMENT_OTHER)
Admit: 2015-11-04 | Discharge: 2015-11-04 | Disposition: A | Discharge status: Home / Self Care | Payer: Medicare HMO | Attending: Emergency Medicine | Admitting: Emergency Medicine

## 2015-11-04 ENCOUNTER — Other Ambulatory Visit: Payer: Self-pay

## 2015-11-04 ENCOUNTER — Encounter (HOSPITAL_COMMUNITY): Payer: Self-pay | Admitting: *Deleted

## 2015-11-04 ENCOUNTER — Emergency Department (HOSPITAL_COMMUNITY): Payer: Medicare HMO

## 2015-11-04 ENCOUNTER — Inpatient Hospital Stay (HOSPITAL_COMMUNITY)
Admission: EM | Admit: 2015-11-04 | Discharge: 2015-11-11 | DRG: 291 | Disposition: A | Discharge status: Home Health | Payer: Medicare HMO | Attending: Internal Medicine | Admitting: Internal Medicine

## 2015-11-04 DIAGNOSIS — G939 Disorder of brain, unspecified: Secondary | ICD-10-CM | POA: Diagnosis not present

## 2015-11-04 DIAGNOSIS — Z87891 Personal history of nicotine dependence: Secondary | ICD-10-CM

## 2015-11-04 DIAGNOSIS — I5032 Chronic diastolic (congestive) heart failure: Secondary | ICD-10-CM | POA: Diagnosis present

## 2015-11-04 DIAGNOSIS — I10 Essential (primary) hypertension: Secondary | ICD-10-CM | POA: Diagnosis present

## 2015-11-04 DIAGNOSIS — N183 Chronic kidney disease, stage 3 unspecified: Secondary | ICD-10-CM

## 2015-11-04 DIAGNOSIS — R600 Localized edema: Secondary | ICD-10-CM | POA: Diagnosis not present

## 2015-11-04 DIAGNOSIS — D631 Anemia in chronic kidney disease: Secondary | ICD-10-CM | POA: Diagnosis present

## 2015-11-04 DIAGNOSIS — N179 Acute kidney failure, unspecified: Secondary | ICD-10-CM | POA: Diagnosis not present

## 2015-11-04 DIAGNOSIS — Z8249 Family history of ischemic heart disease and other diseases of the circulatory system: Secondary | ICD-10-CM

## 2015-11-04 DIAGNOSIS — I739 Peripheral vascular disease, unspecified: Secondary | ICD-10-CM | POA: Diagnosis present

## 2015-11-04 DIAGNOSIS — I5043 Acute on chronic combined systolic (congestive) and diastolic (congestive) heart failure: Secondary | ICD-10-CM

## 2015-11-04 DIAGNOSIS — N189 Chronic kidney disease, unspecified: Secondary | ICD-10-CM | POA: Insufficient documentation

## 2015-11-04 DIAGNOSIS — D496 Neoplasm of unspecified behavior of brain: Secondary | ICD-10-CM | POA: Diagnosis present

## 2015-11-04 DIAGNOSIS — Z7902 Long term (current) use of antithrombotics/antiplatelets: Secondary | ICD-10-CM

## 2015-11-04 DIAGNOSIS — D499 Neoplasm of unspecified behavior of unspecified site: Secondary | ICD-10-CM | POA: Diagnosis present

## 2015-11-04 DIAGNOSIS — G40909 Epilepsy, unspecified, not intractable, without status epilepticus: Secondary | ICD-10-CM | POA: Diagnosis present

## 2015-11-04 DIAGNOSIS — E875 Hyperkalemia: Secondary | ICD-10-CM | POA: Diagnosis not present

## 2015-11-04 DIAGNOSIS — I5033 Acute on chronic diastolic (congestive) heart failure: Secondary | ICD-10-CM | POA: Diagnosis not present

## 2015-11-04 DIAGNOSIS — Z833 Family history of diabetes mellitus: Secondary | ICD-10-CM

## 2015-11-04 DIAGNOSIS — Z9114 Patient's other noncompliance with medication regimen: Secondary | ICD-10-CM

## 2015-11-04 DIAGNOSIS — G9389 Other specified disorders of brain: Secondary | ICD-10-CM | POA: Insufficient documentation

## 2015-11-04 DIAGNOSIS — Z79899 Other long term (current) drug therapy: Secondary | ICD-10-CM

## 2015-11-04 DIAGNOSIS — Z7982 Long term (current) use of aspirin: Secondary | ICD-10-CM

## 2015-11-04 DIAGNOSIS — M7989 Other specified soft tissue disorders: Secondary | ICD-10-CM

## 2015-11-04 DIAGNOSIS — D649 Anemia, unspecified: Secondary | ICD-10-CM | POA: Diagnosis present

## 2015-11-04 DIAGNOSIS — I130 Hypertensive heart and chronic kidney disease with heart failure and stage 1 through stage 4 chronic kidney disease, or unspecified chronic kidney disease: Principal | ICD-10-CM | POA: Diagnosis present

## 2015-11-04 LAB — I-STAT TROPONIN, ED: Troponin i, poc: 0.03 ng/mL (ref 0.00–0.08)

## 2015-11-04 LAB — URINALYSIS, ROUTINE W REFLEX MICROSCOPIC
BILIRUBIN URINE: NEGATIVE
GLUCOSE, UA: NEGATIVE mg/dL
HGB URINE DIPSTICK: NEGATIVE
Ketones, ur: NEGATIVE mg/dL
Leukocytes, UA: NEGATIVE
Nitrite: NEGATIVE
PH: 6.5 (ref 5.0–8.0)
Protein, ur: NEGATIVE mg/dL
SPECIFIC GRAVITY, URINE: 1.007 (ref 1.005–1.030)

## 2015-11-04 LAB — COMPREHENSIVE METABOLIC PANEL
ALBUMIN: 3.3 g/dL — AB (ref 3.5–5.0)
ALK PHOS: 85 U/L (ref 38–126)
ALT: 39 U/L (ref 17–63)
ANION GAP: 6 (ref 5–15)
AST: 30 U/L (ref 15–41)
BILIRUBIN TOTAL: 0.2 mg/dL — AB (ref 0.3–1.2)
BUN: 26 mg/dL — ABNORMAL HIGH (ref 6–20)
CALCIUM: 8.6 mg/dL — AB (ref 8.9–10.3)
CO2: 28 mmol/L (ref 22–32)
Chloride: 106 mmol/L (ref 101–111)
Creatinine, Ser: 2.18 mg/dL — ABNORMAL HIGH (ref 0.61–1.24)
GFR calc Af Amer: 35 mL/min — ABNORMAL LOW (ref >60)
GFR calc non Af Amer: 30 mL/min — ABNORMAL LOW (ref >60)
Glucose, Bld: 94 mg/dL (ref 65–99)
Potassium: 3.8 mmol/L (ref 3.5–5.1)
SODIUM: 140 mmol/L (ref 135–145)
Total Protein: 7.6 g/dL (ref 6.5–8.1)

## 2015-11-04 LAB — CBC
HEMATOCRIT: 29.5 % — AB (ref 39.0–52.0)
HEMOGLOBIN: 9.2 g/dL — AB (ref 13.0–17.0)
MCH: 30.6 pg (ref 26.0–34.0)
MCHC: 31.2 g/dL (ref 30.0–36.0)
MCV: 98 fL (ref 78.0–100.0)
Platelets: 212 10*3/uL (ref 150–400)
RBC: 3.01 MIL/uL — AB (ref 4.22–5.81)
RDW: 15.3 % (ref 11.5–15.5)
WBC: 5.7 10*3/uL (ref 4.0–10.5)

## 2015-11-04 LAB — BRAIN NATRIURETIC PEPTIDE: B Natriuretic Peptide: 86.3 pg/mL (ref 0.0–100.0)

## 2015-11-04 LAB — POC OCCULT BLOOD, ED: Fecal Occult Bld: NEGATIVE

## 2015-11-04 MED ORDER — ATORVASTATIN CALCIUM 40 MG PO TABS
40.0000 mg | ORAL_TABLET | Freq: Every evening | ORAL | Status: DC
  Administered 2015-11-04 – 2015-11-10 (×7): 40 mg via ORAL
  Filled 2015-11-04 (×7): qty 1

## 2015-11-04 MED ORDER — CHLORHEXIDINE GLUCONATE 0.12 % MT SOLN
15.0000 mL | Freq: Two times a day (BID) | OROMUCOSAL | Status: DC
  Administered 2015-11-04 – 2015-11-11 (×14): 15 mL via OROMUCOSAL
  Filled 2015-11-04 (×12): qty 15

## 2015-11-04 MED ORDER — LACOSAMIDE 50 MG PO TABS
200.0000 mg | ORAL_TABLET | Freq: Two times a day (BID) | ORAL | Status: DC
  Administered 2015-11-04 – 2015-11-11 (×14): 200 mg via ORAL
  Filled 2015-11-04 (×14): qty 4

## 2015-11-04 MED ORDER — ACETAMINOPHEN 325 MG PO TABS
650.0000 mg | ORAL_TABLET | Freq: Four times a day (QID) | ORAL | Status: DC | PRN
  Administered 2015-11-05: 650 mg via ORAL
  Filled 2015-11-04: qty 2

## 2015-11-04 MED ORDER — LEVETIRACETAM 750 MG PO TABS
750.0000 mg | ORAL_TABLET | Freq: Two times a day (BID) | ORAL | Status: DC
  Administered 2015-11-04 – 2015-11-11 (×14): 750 mg via ORAL
  Filled 2015-11-04 (×14): qty 1

## 2015-11-04 MED ORDER — CETYLPYRIDINIUM CHLORIDE 0.05 % MT LIQD
7.0000 mL | Freq: Two times a day (BID) | OROMUCOSAL | Status: DC
  Administered 2015-11-05 – 2015-11-10 (×5): 7 mL via OROMUCOSAL

## 2015-11-04 MED ORDER — ACETAMINOPHEN 650 MG RE SUPP: 650.0000 mg | Freq: Four times a day (QID) | RECTAL | Status: DC | PRN

## 2015-11-04 MED ORDER — ASPIRIN EC 81 MG PO TBEC
81.0000 mg | DELAYED_RELEASE_TABLET | Freq: Every day | ORAL | Status: DC
  Administered 2015-11-05 – 2015-11-11 (×7): 81 mg via ORAL
  Filled 2015-11-04 (×7): qty 1

## 2015-11-04 MED ORDER — SODIUM CHLORIDE 0.9% FLUSH
3.0000 mL | Freq: Two times a day (BID) | INTRAVENOUS | Status: DC
  Administered 2015-11-04 – 2015-11-11 (×11): 3 mL via INTRAVENOUS

## 2015-11-04 MED ORDER — ENOXAPARIN SODIUM 40 MG/0.4ML ~~LOC~~ SOLN
40.0000 mg | SUBCUTANEOUS | Status: DC
  Administered 2015-11-04 – 2015-11-06 (×3): 40 mg via SUBCUTANEOUS
  Filled 2015-11-04 (×3): qty 0.4

## 2015-11-04 MED ORDER — CLONIDINE HCL 0.1 MG PO TABS
0.2000 mg | ORAL_TABLET | Freq: Two times a day (BID) | ORAL | Status: DC
  Administered 2015-11-04 – 2015-11-11 (×14): 0.2 mg via ORAL
  Filled 2015-11-04 (×14): qty 2

## 2015-11-04 MED ORDER — CLOBAZAM 10 MG PO TABS
20.0000 mg | ORAL_TABLET | Freq: Two times a day (BID) | ORAL | Status: DC
  Administered 2015-11-04 – 2015-11-11 (×14): 20 mg via ORAL
  Filled 2015-11-04 (×14): qty 2

## 2015-11-04 MED ORDER — LEVETIRACETAM 750 MG PO TABS
750.0000 mg | ORAL_TABLET | Freq: Two times a day (BID) | ORAL | Status: DC
  Filled 2015-11-04: qty 1

## 2015-11-04 MED ORDER — POLYETHYLENE GLYCOL 3350 17 G PO PACK: 17.0000 g | PACK | Freq: Every day | ORAL | Status: DC | PRN

## 2015-11-04 MED ORDER — FUROSEMIDE 10 MG/ML IJ SOLN
40.0000 mg | Freq: Two times a day (BID) | INTRAMUSCULAR | Status: DC
  Administered 2015-11-05 – 2015-11-06 (×4): 40 mg via INTRAVENOUS
  Filled 2015-11-04 (×4): qty 4

## 2015-11-04 MED ORDER — AMLODIPINE BESYLATE 10 MG PO TABS
10.0000 mg | ORAL_TABLET | Freq: Every day | ORAL | Status: DC
  Administered 2015-11-05 – 2015-11-11 (×7): 10 mg via ORAL
  Filled 2015-11-04 (×7): qty 1

## 2015-11-04 MED ORDER — LABETALOL HCL 100 MG PO TABS
200.0000 mg | ORAL_TABLET | Freq: Two times a day (BID) | ORAL | Status: DC
  Administered 2015-11-04 – 2015-11-11 (×14): 200 mg via ORAL
  Filled 2015-11-04 (×14): qty 2

## 2015-11-04 MED ORDER — POTASSIUM CHLORIDE CRYS ER 20 MEQ PO TBCR
40.0000 meq | EXTENDED_RELEASE_TABLET | Freq: Two times a day (BID) | ORAL | Status: DC
  Administered 2015-11-04 – 2015-11-06 (×5): 40 meq via ORAL
  Filled 2015-11-04 (×6): qty 2

## 2015-11-04 MED ORDER — GABAPENTIN 300 MG PO CAPS
300.0000 mg | ORAL_CAPSULE | Freq: Three times a day (TID) | ORAL | Status: DC
  Administered 2015-11-04 – 2015-11-11 (×20): 300 mg via ORAL
  Filled 2015-11-04 (×20): qty 1

## 2015-11-04 MED ORDER — LEVETIRACETAM 750 MG PO TABS: 750.0000 mg | ORAL_TABLET | Freq: Two times a day (BID) | ORAL | Status: DC

## 2015-11-04 MED ORDER — FUROSEMIDE 10 MG/ML IJ SOLN
40.0000 mg | Freq: Once | INTRAMUSCULAR | Status: AC
  Administered 2015-11-04: 40 mg via INTRAVENOUS
  Filled 2015-11-04: qty 4

## 2015-11-04 MED ORDER — ISOSORBIDE MONONITRATE ER 30 MG PO TB24
90.0000 mg | ORAL_TABLET | Freq: Every day | ORAL | Status: DC
  Administered 2015-11-05 – 2015-11-11 (×7): 90 mg via ORAL
  Filled 2015-11-04 (×7): qty 3

## 2015-11-04 MED ORDER — CLOPIDOGREL BISULFATE 75 MG PO TABS
75.0000 mg | ORAL_TABLET | Freq: Every evening | ORAL | Status: DC
  Administered 2015-11-04 – 2015-11-10 (×7): 75 mg via ORAL
  Filled 2015-11-04 (×8): qty 1

## 2015-11-04 MED ORDER — PANTOPRAZOLE SODIUM 40 MG PO TBEC
40.0000 mg | DELAYED_RELEASE_TABLET | Freq: Every day | ORAL | Status: DC
  Administered 2015-11-05 – 2015-11-11 (×7): 40 mg via ORAL
  Filled 2015-11-04 (×7): qty 1

## 2015-11-04 NOTE — ED Notes (Signed)
IV ATTEMPT X2 UNSUCCESSFUL WITH THE [place] MACHINE.

## 2015-11-04 NOTE — ED Notes (Signed)
MD at bedside. 

## 2015-11-04 NOTE — Progress Notes (Signed)
Preliminary results by tech - Lower Ext. Venous Duplex Completed. Negative for deep and superficial vein thrombosis in both legs. Harmony Sandell, BS, RDMS, RVT  

## 2015-11-04 NOTE — ED Provider Notes (Addendum)
CSN: [id number]     Arrival date & time 11/04/15  1424 History   First MD Initiated Contact with Patient 11/04/15 1535     Chief Complaint  Patient presents with  . Leg Swelling     (Consider location/radiation/quality/duration/timing/severity/associated sxs/prior Treatment) The history is provided by the patient and a relative. The history is limited by the condition of the patient.  Patient w recent dx brain mass, hx chf, pvd, with increased bilateral leg edema to thighs in past week. Symptoms gradual onset, persistent, worsening. ?orthopnea. No pnd. Mild sob. No chest pain. States compliant w normal meds. Urinating normal amount/frequency. Denies hx dvt or pe. + hx pvd, with RLE bypass in past - states since then RLE is larger than LLE, but now both are swollen.       Past Medical History  Diagnosis Date  . Hypertension   . Anginal pain ([place])   . Peripheral vascular disease ([place])   . Chronic systolic CHF (congestive heart failure) [facility])     [facility]; Dr. Rich Reining  . Coronary artery disease     08/08/12 - patient denied CAD, but is followed for CHF  . 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 Heart murmur     "since birth"; small membraneous VSD by 01/2012 echo   Past Surgical History  Procedure Laterality Date  . Neck surgery    . Neck surgery    . Stones    . Gallstones    . Femoral-popliteal bypass graft Right 08/17/2012    Procedure: BYPASS GRAFT FEMORAL-POPLITEAL ARTERY;  Surgeon: Mal Misty, MD;  Location: [facility];  Service: Vascular;  Laterality: Right;  using non reversed Sapphenous vein with intraoperative arteriogram.  . Abdominal aortagram N/A 07/05/2012    Procedure: ABDOMINAL Maxcine Ham;  Surgeon: Conrad [place], MD;  Location: [facility] CATH LAB;  Service: Cardiovascular;  Laterality: N/A;   No family history on file. Social History  Substance Use Topics  . Smoking status: Former  Smoker    Types: Cigarettes    Quit date: 06/09/2012  . Smokeless tobacco: Never Used  . Alcohol Use: No    Review of Systems  Constitutional: Negative for fever.  HENT: Negative for sore throat.   Eyes: Negative for redness.  Respiratory: Positive for shortness of breath.   Cardiovascular: Positive for leg swelling. Negative for chest pain.  Gastrointestinal: Negative for vomiting and abdominal pain.  Genitourinary: Negative for flank pain.  Musculoskeletal: Negative for back pain and neck pain.  Skin: Negative for rash.  Neurological: Negative for headaches.  Hematological: Does not bruise/bleed easily.  Psychiatric/Behavioral: Negative for confusion.      Allergies  Ace inhibitors  Home Medications   Prior to Admission medications   Medication Sig Start Date End Date Taking? Authorizing Provider  amLODipine (NORVASC) 10 MG tablet Take 10 mg by mouth daily.    Historical Provider, MD  aspirin EC 81 MG tablet Take 81 mg by mouth daily.    Historical Provider, MD  atorvastatin (LIPITOR) 40 MG tablet Take 40 mg by mouth daily.    Historical Provider, MD  cloBAZam (ONFI) 20 MG tablet Take 1 tablet (20 mg total) by mouth 2 (two) times daily. 10/20/15   Domenic Polite, MD  cloNIDine (CATAPRES) 0.2 MG tablet Take 0.2 mg by mouth 2 (two) times daily.  05/22/13   Historical Provider, MD  clopidogrel (PLAVIX) 75 MG tablet Take 75  mg by mouth daily.    Historical Provider, MD  furosemide (LASIX) 20 MG tablet Take 1 tablet (20 mg total) by mouth daily. 10/20/15   Domenic Polite, MD  gabapentin (NEURONTIN) 300 MG capsule Take 300 mg by mouth 3 (three) times daily.    Historical Provider, MD  isosorbide mononitrate (IMDUR) 30 MG 24 hr tablet Take 90 mg by mouth daily.    Historical Provider, MD  labetalol (NORMODYNE) 200 MG tablet Take 200 mg by mouth 2 (two) times daily.    Historical Provider, MD  lacosamide (VIMPAT) 200 MG TABS tablet Take 1 tablet (200 mg total) by mouth every 12 (twelve)  hours. 10/20/15   Domenic Polite, MD  levETIRAcetam (KEPPRA) 750 MG tablet Take 1 tablet (750 mg total) by mouth 2 (two) times daily. 10/20/15   Domenic Polite, MD  nitroGLYCERIN (NITROSTAT) 0.4 MG SL tablet Place 0.4 mg under the tongue every 5 (five) minutes as needed for chest pain.    Historical Provider, MD  pantoprazole (PROTONIX) 20 MG tablet Take 40 mg by mouth daily.    Historical Provider, MD  polyethylene glycol (MIRALAX / GLYCOLAX) packet Take 17 g by mouth daily as needed. 10/20/15   Domenic Polite, MD   BP 147/70 mmHg  Pulse 72  Temp(Src) 98.2 F (36.8 C) (Oral)  Resp 16  SpO2 95% Physical Exam  Constitutional: He appears well-developed and well-nourished. No distress.  HENT:  Mouth/Throat: Oropharynx is clear and moist.  Eyes: Conjunctivae are normal. No scleral icterus.  Neck: Neck supple. No tracheal deviation present.  Cardiovascular: Normal rate, regular rhythm, normal heart sounds and intact distal pulses.   Pulmonary/Chest: Effort normal. No accessory muscle usage. No respiratory distress.  Basilar rales  Abdominal: Soft. Bowel sounds are normal. He exhibits no distension. There is no tenderness.  Musculoskeletal: Normal range of motion. He exhibits edema.  Moderate/sev edema to bil legs to thighs, R>L. Distal pulses palp.  Neurological: He is alert.  Skin: Skin is warm and dry. No erythema.  Psychiatric: He has a normal mood and affect.  Nursing note and vitals reviewed.   ED Course  Procedures (including critical care time) Labs Review   Results for orders placed or performed during the hospital encounter of 11/04/15  CBC  Result Value Ref Range   WBC 5.7 4.0 - 10.5 K/uL   RBC 3.01 (L) 4.22 - 5.81 MIL/uL   Hemoglobin 9.2 (L) 13.0 - 17.0 g/dL   HCT 29.5 (L) 39.0 - 52.0 %   MCV 98.0 78.0 - 100.0 fL   MCH 30.6 26.0 - 34.0 pg   MCHC 31.2 30.0 - 36.0 g/dL   RDW 15.3 11.5 - 15.5 %   Platelets 212 150 - 400 K/uL  Brain natriuretic peptide  Result Value Ref  Range   B Natriuretic Peptide 86.3 0.0 - 100.0 pg/mL  Comprehensive metabolic panel  Result Value Ref Range   Sodium 140 135 - 145 mmol/L   Potassium 3.8 3.5 - 5.1 mmol/L   Chloride 106 101 - 111 mmol/L   CO2 28 22 - 32 mmol/L   Glucose, Bld 94 65 - 99 mg/dL   BUN 26 (H) 6 - 20 mg/dL   Creatinine, Ser 2.18 (H) 0.61 - 1.24 mg/dL   Calcium 8.6 (L) 8.9 - 10.3 mg/dL   Total Protein 7.6 6.5 - 8.1 g/dL   Albumin 3.3 (L) 3.5 - 5.0 g/dL   AST 30 15 - 41 U/L   ALT 39 17 - 63  U/L   Alkaline Phosphatase 85 38 - 126 U/L   Total Bilirubin 0.2 (L) 0.3 - 1.2 mg/dL   GFR calc non Af Amer 30 (L) >60 mL/min   GFR calc Af Amer 35 (L) >60 mL/min   Anion gap 6 5 - 15  I-stat troponin, ED  Result Value Ref Range   Troponin i, poc 0.03 0.00 - 0.08 ng/mL   Comment 3            Mr Jeri Cos Wo Contrast  10/19/2015  CLINICAL DATA:  Continued surveillance LEFT parietal lesion. Surgical planning. EXAM: MRI HEAD WITHOUT AND WITH CONTRAST TECHNIQUE: Multiplanar, multiecho pulse sequences of the brain and surrounding structures were obtained without and with intravenous contrast. CONTRAST:  71mL MULTIHANCE GADOBENATE DIMEGLUMINE 529 MG/ML IV SOLN COMPARISON:  Multiple priors, including previous MRI examinations from 2016. FINDINGS: Moderate-sized area of T2 and FLAIR hyperintensity, involving the LEFT parietal cortex and subcortical white matter, straddling the PCA and MCA vascular territories, no significant restriction, developing vasogenic edema, gyriform swelling, petechial hemorrhage, with a paucity of enhancement. Primary brain tumor is favored. The prolonged time course and progressive worsening of the imaging findings are not consistent with vascular disease or metastasis. Abnormal T2 signal cross-section measures 31 x 48 mm on image 17 series 5. No significant change most recent MR of 10/11/2015. 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 Electronically Signed   By: Staci Righter M.D.   On: 10/19/2015 20:14   Mr Jeri Cos [id number] Contrast  10/11/2015  CLINICAL DATA:  Seizures. EXAM: MRI HEAD WITHOUT AND WITH CONTRAST TECHNIQUE: Multiplanar, multiecho pulse sequences of the brain and surrounding structures were obtained without and with intravenous contrast. CONTRAST:  66mL MULTIHANCE GADOBENATE DIMEGLUMINE 529 MG/ML IV SOLN COMPARISON:  Head CT 10/07/2015 and MRI 08/19/2015 FINDINGS: Confluent region of T2 hyperintensity/edema in the left parietal lobe and posterior cingulate gyrus has mildly progressed from the prior MRI, again with mild mass effect with sulcal effacement. Diffusion is facilitated, and this extends over approximately 7 mm. There is new left parietal cortical trace diffusion signal abnormality at the margins of this region with relatively normal ADC. Small foci of susceptibility artifact within this region are unchanged and compatible with remote hemorrhages. There is some mildly prominent leptomeningeal vascular enhancement in this region as well as some non masslike petechial parenchymal enhancement. Elsewhere, there is no evidence of acute infarct. No extra-axial fluid collection or midline shift. Slight mass effect on the left lateral ventricle from the left parietal abnormality, with the ventricles otherwise normal in size. Scattered, small foci of T2 hyperintensity elsewhere in the cerebral white matter bilaterally are unchanged and nonspecific but compatible with mild chronic small vessel ischemic disease. No abnormal enhancement is identified elsewhere. Orbits are unremarkable. Mild mucosal thickening is noted in the paranasal sinuses, and there is a small chronic right mastoid effusion. Major intracranial vascular flow voids are unchanged. IMPRESSION: 1. Mild further enlargement of the region of left parietal signal abnormality with small amount of petechial enhancement. Given the mild progression from last month's MRI as well as an appearance which is largely  that of white matter edema, this is most concerning for neoplasm. Subacute infarct would be expected to have shown noticeable improvement in this timeframe, even if there had been some additional ischemia in the interim. Cerebritis could also give this appearance though does not fit the clinical presentation. 2. Mild left parietal cortical diffusion abnormality likely reflects recent seizure  activity. Electronically Signed   By: Logan Bores M.D.   On: 10/11/2015 16:35   Dg Chest Port 1 View  11/04/2015  CLINICAL DATA:  Bilateral leg swelling. History of congestive heart failure. Hypertension. EXAM: PORTABLE CHEST 1 VIEW COMPARISON:  Chest radiograph 10/11/2015 FINDINGS: There is shallow lung inflation. Cardiomediastinal silhouette is enlarged. There are bilateral perihilar opacities with peribronchial cuffing. No sizable pleural effusion on this semi upright radiograph. No pneumothorax. IMPRESSION: 1. Moderate pulmonary edema. 2. Enlarged cardiomediastinal silhouette, which may indicate pericardial effusion versus cardiomegaly alone. The appearance may also be exaggerated by a combination of AP technique and shallow lung inflation. Electronically Signed   By: Ulyses Jarred M.D.   On: 11/04/2015 16:14        I have personally reviewed and evaluated these images and lab results as part of my medical decision-making.   EKG Interpretation   Date/Time:  Wednesday November 04 2015 15:33:17 EDT Ventricular Rate:  84 PR Interval:    QRS Duration: 95 QT Interval:  394 QTC Calculation: 466 R Axis:   74 Text Interpretation:  Sinus rhythm No significant change since last  tracing Confirmed by Ashok Cordia  MD, Lennette Bihari ([id number]) on 11/04/2015 3:42:00 PM      MDM   Iv ns. Labs. Ecg. Cxr.   Reviewed nursing notes and prior charts for additional history.   Vascular dopplers negative for dvt.  cxr c/w chf/pulm edema.  Lasix iv.   Feel likely edema/vasc congestion multifactorial, w hx chf, high bp, renal insuff,  anemia, etc.  Renal fxn/creatinine mildly increased as compared to at recent d/c (similar to AKI when admitted).   hgb mildly decreased as compared to prior. Will add hemoccult stool to workup.   Hospitalists consulted for admission.  Hospitalist indicates temp orders to tele bed.        Lajean Saver, MD 11/04/15 [phone]

## 2015-11-04 NOTE — H&P (Addendum)
History and Physical  Patient Name: Paul Mullins     [name]    DOB: 01-13-1951    DOA: 11/04/2015 PCP: Alfonso Patten, MD   Patient coming from: [facility] in rehab  Chief Complaint: Leg swelling for 1 weeks  HPI: Paul Mullins is a 65 y.o. male with a past medical history significant for HTN, PVD s/p RIGHT fem-pop bypass in July 28, 2012, CKD baseline Cr 1.8, chronic diastolic CHF, and recently diagnosed brain neoplasm c/b seizures who presents with 20 lbs weight gain and leg swelling since his last hospitalization.  Since his discharge to [facility] 2 weeks ago, family had noticed gradually worsening leg edema. The swelling is worse on the RIGHT, also associated with hand puffiness, and today and yesterday was noticeably worse so family were concerned that it might prevent him from having surgery and brought him to the ER. The patient is able to walk with a walker since getting to [facility], and neither he nor family have noticed SOB or dyspnea with exertion.  He denies orthopnea, PND. He also denies fever, chills, cough, chest congestion.    ED course: -Afebrile, heart rate normal but respirations elevated, BP normal, saturating well on room air -Na 140, K 3.8, Cr 2.18 (baseline rising over last year, but 1.6-1.8 by last discharge), WBC 5.7K, Hgb 9.2 (drifting down over last two months in our records) -BNP high normal -ECG without ischemic changes -CXR showed bilateral interstitial opacities consistent with edema -He was given furosemide IV for presumed diastolic CHF and TRH were asked to evaluate for admission.   Other pertinent facts: -The patient was living independently with his girlfriend of 28 years until she died in 07-29-2022.   -The patient has had known seizures diagnosed by Neurology at [facility] and was precribed [place] over a year ago, per [facility], but patient and his family deny this, and he denies ever knowing about seizures. -In July 2016, the patient was  admitted to [facility] for hypertensive urgency and noted to have a L parietal abnormality on brain MRI, presumed to be infarct  -Since his partner's death, he has been admitted several times for hypertensive urgency/emergency at [facility], due to medication nonadherence.  -In May of this year, he was seen for a seizure, hypertensive emergency and AKI at OSH.  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   -Renal US during that hospitalization showed a complex renal mass, suspected to be benign -In July he was seen there again for seizure, restarted on Keppra (presumed to be nonadherent) and discharged. 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  Pt denies any fever, chills, cough. Denies PND, orthopnea, dyspnea of exertion.    All other systems negative except as just noted or noted in the history of present illness.    Past Medical History  Diagnosis Date  . Hypertension   . Anginal pain ([place])   . Peripheral vascular disease ([place])   .  Chronic systolic CHF (congestive heart failure) [facility])     [facility]; Dr. Rich Reining  . Coronary artery disease     08/08/12 - patient denied CAD, but is followed for CHF  . 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 Heart murmur     "since birth"; small membraneous VSD by 01/2012 echo     Past Surgical History  Procedure Laterality Date  . Neck surgery    . Neck surgery    . Stones    . Gallstones    . Femoral-popliteal bypass graft Right 08/17/2012    Procedure: BYPASS GRAFT FEMORAL-POPLITEAL ARTERY;  Surgeon: Mal Misty, MD;  Location: [facility];  Service: Vascular;  Laterality: Right;  using non reversed Sapphenous vein with intraoperative arteriogram.  . Abdominal aortagram N/A 07/05/2012    Procedure: ABDOMINAL Maxcine Ham;  Surgeon: Conrad Ritchie, MD;  Location: [facility] CATH LAB;  Service: Cardiovascular;  Laterality: N/A;    Social History: Patient lives At [facility] for rehabilitation since being discharged from the hospital 3 weeks ago.  The patient walks with a walker. He is a former smoker. He is not using alcohol for many years. His girlfriend of 89 years died in 08-04-2022. His HCPOA is his nephewRaymond.  Allergies  Allergen Reactions  . Ace Inhibitors Other (See Comments)    Impaired  Kidney Function    Family history: family history includes Diabetes in his father and mother; Hypertension in his father and mother.  Prior to Admission medications   Medication Sig Start Date End Date Taking? Authorizing Provider  amLODipine (NORVASC) 10 MG tablet Take 10 mg by mouth daily.   Yes Historical Provider, MD  aspirin EC 81 MG tablet Take 81 mg by mouth daily.   Yes Historical Provider, MD  atorvastatin (LIPITOR) 40 MG tablet Take 40 mg by mouth daily.   Yes Historical Provider, MD  cloBAZam (ONFI) 20 MG tablet Take 1 tablet (20 mg total) by mouth 2 (two) times daily. 10/20/15  Yes Domenic Polite, MD  cloNIDine (CATAPRES) 0.2 MG tablet Take 0.2 mg by mouth 2 (two) times daily.  05/22/13  Yes Historical Provider, MD  clopidogrel (PLAVIX) 75 MG tablet Take 75 mg by mouth every evening.    Yes Historical Provider, MD  furosemide (LASIX) 20 MG tablet Take 1 tablet (20 mg total) by mouth daily. 10/20/15  Yes Domenic Polite, MD  gabapentin (NEURONTIN) 300 MG capsule Take 300 mg by  mouth 3 (three) times daily.   Yes Historical Provider, MD  isosorbide mononitrate (IMDUR) 30 MG 24 hr tablet Take 90 mg by mouth daily.   Yes Historical Provider, MD  labetalol (NORMODYNE) 200 MG tablet Take 200 mg by mouth 2 (two) times daily.   Yes Historical Provider, MD  lacosamide (VIMPAT) 200 MG TABS tablet Take 1 tablet (200 mg total) by mouth every 12 (twelve) hours. 10/20/15  Yes Domenic Polite, MD  levETIRAcetam (KEPPRA) 750 MG tablet Take 1 tablet (750 mg total) by mouth 2 (two) times daily. 10/20/15  Yes Domenic Polite, MD  nitroGLYCERIN (NITROSTAT) 0.4 MG SL tablet Place 0.4 mg under the tongue every 5 (five) minutes as needed for chest pain.   Yes Historical Provider, MD  pantoprazole (PROTONIX) 40 MG tablet Take 40 mg by mouth daily.   Yes Historical Provider, MD  polyethylene glycol (MIRALAX / GLYCOLAX) packet Take 17 g by mouth daily as needed. 10/20/15  Yes Preetha  Broadus John, MD       Physical Exam: BP 154/72 mmHg  Pulse 72  Temp(Src) 98.2 F (36.8 C) (Oral)  SpO2 94% General appearance: Well-developed, male, alert and in no acute distress.  Appears weak and debilitated, but muscle mass normal.  Breathing somewhat fast. Eyes: Conjunctiva normal, lids and lashes normal.   PERRL.  ENT: No nasal deformity, discharge.  OP filmy without lesions.   Lymph: No cervical or supraclavicular lymphadenopathy. Skin: Warm and dry.  No suspicious rashes or lesions. Cardiac: RRR, nl [id number], systolic murmur, blowing in quality, best heard at apex, LLSB.  Capillary refill is brisk.  JVP notparticularly elevated, HJRseen.  2+ LE edema to knee. Right arm puffiness, no pitting. Radial and DP pulses all diminished. Respiratory: Increased respiratory rate.  Rales at both bases. Wheezes occasionally. GI: Abdomen soft without rigidity.  Distended, but no TTP.  MSK: No deformities or effusions.  No clubbing/cyanosis. Neuro: Cranial nerves normal. Sensorium intact and responding to questions, attention  normal.  Speech is fluent.  Moves all extremities equally and with normal coordination.    Psych: Affect blunted.  Judgment and insight appear normal.       Labs on Admission:  I have personally reviewed following labs and imaging studies: CBC:  Recent Labs Lab 11/04/15 1519  WBC 5.7  HGB 9.2*  HCT 29.5*  MCV 98.0  PLT 99991111   Basic Metabolic Panel:  Recent Labs Lab 11/04/15 1542  NA 140  K 3.8  CL 106  CO2 28  GLUCOSE 94  BUN 26*  CREATININE 2.18*  CALCIUM 8.6*   GFR: CrCl cannot be calculated (Unknown ideal weight.).  Liver Function Tests:  Recent Labs Lab 11/04/15 1542  AST 30  ALT 39  ALKPHOS 85  BILITOT 0.2*  PROT 7.6  ALBUMIN 3.3*         Radiological Exams on Admission: Personally reviewed: Dg Chest Port 1 View  11/04/2015  CLINICAL DATA:  Bilateral leg swelling. History of congestive heart failure. Hypertension. EXAM: PORTABLE CHEST 1 VIEW COMPARISON:  Chest radiograph 10/11/2015 FINDINGS: There is shallow lung inflation. Cardiomediastinal silhouette is enlarged. There are bilateral perihilar opacities with peribronchial cuffing. No sizable pleural effusion on this semi upright radiograph. No pneumothorax. IMPRESSION: 1. Moderate pulmonary edema. 2. Enlarged cardiomediastinal silhouette, which may indicate pericardial effusion versus cardiomegaly alone. The appearance may also be exaggerated by a combination of AP technique and shallow lung inflation. Electronically Signed   By: Ulyses Jarred M.D.   On: 11/04/2015 16:14    EKG: Independently reviewed. Rate 84,QTc 466, no ST or T wave changes.    Assessment/Plan 1. Suspected acute on chronic diastolic CHF:  LVH but EF 60% by outside echo in May.  Previously noted VSD, small membranous.  He has what appears to be pulmonary edema on CXR and marked LE edema, but actually near normal BNP. No fever, cough to suggest CAP.  LE dopplers show compressible veins bilaterally, no sign of DVT and moderate  interstitial edema. Do not suspect his swelling is a complication of his bypass. -Furosemide 40 mg BID -Close monitoring of electrolytes and discontinue Lasix if worsening -Daily weights, strict I/Os -Telemetry -Check procalcitonin -D-dimer ordered and if positive, VQ scan   2. Anemia, normocytic:  Unclear etiology.  Interestingly, in our system, his Hgbs have been trending down slowly from normal over months. In the [facility] notes, his Hgbs have been fluctuating wildly over the last year from 10 to 18 g/dL.   Suspect  this is all from chronic renal disease. -Check iron stores -Check B12, folate -Check LDH,haptoglobin -Check reticulocytes -Check FOBT  3. Acute on chronic renal disease:  Cautiously suspect this is renal congestion in setting of fluid overload.  He had a renal US in May that showed signs consistent with chronic renal disease but also a 2.2 x 1.9 cm complex cyst-appearing mass, similar to a previous study and also visualized on outside CT. Renal MRI was recommended, but not performed. -Renal MRI ordered -Daily BMP -Check UA and urine P/C ratio  4. Seizures:  Due to #5 below. -Continue clobazam, Vimpat and Keppra -Monitor for SJS on clobazam -Seizure precautions  5. Brain neoplasm:  Suspected primary brain tumor, per NSG.  Saw Dr. Vertell Limber on Monday, plan for biopsy on 11/14/15.  6. HTN:  -Continue amlodipine, labetalol, clonidine, Imdur  7. PVD s/p fem-pop bypass:  -Continue statin and aspirin -Continue Plavix     DVT prophylaxis: Lovenox  Code Status: FULL  Family Communication: Bonne Dolores, HCPOA at bedside.  Disposition Plan: Anticipate diuresis and close monitoring of renal function.  Admit weight in late June was 72 kg.  Consults called: None Admission status: OBS, tele At the point of initial evaluation, it is my clinical opinion that admission for OBSERVATION is reasonable and necessary because the patient's presenting complaints  in the context of their chronic conditions represent sufficient risk of deterioration or significant morbidity to constitute reasonable grounds for close observation in the hospital setting, but that the patient may be medically stable for discharge from the hospital within 24 to 48 hours.    Medical decision making: Patient seen at 7:54 PM on 11/04/2015.  The patient was discussed with Dr. Ashok Cordia. What exists of the patient's chart and CareEverywhere outside records were reviewed in depth.  Clinical condition: stable.        Edwin Dada [facility] Pager [phone]

## 2015-11-04 NOTE — ED Notes (Signed)
Pt's sister reports bila leg swelling x 1 week.  Was recently found to have a tumor in his brain.  Pt currently lives at [facility] for rehab.  Pt denies any diff breathing at this time.

## 2015-11-05 ENCOUNTER — Observation Stay (HOSPITAL_COMMUNITY): Payer: Medicare HMO

## 2015-11-05 DIAGNOSIS — I5033 Acute on chronic diastolic (congestive) heart failure: Secondary | ICD-10-CM | POA: Diagnosis not present

## 2015-11-05 DIAGNOSIS — Z87891 Personal history of nicotine dependence: Secondary | ICD-10-CM | POA: Diagnosis not present

## 2015-11-05 DIAGNOSIS — D496 Neoplasm of unspecified behavior of brain: Secondary | ICD-10-CM | POA: Diagnosis present

## 2015-11-05 DIAGNOSIS — Z79899 Other long term (current) drug therapy: Secondary | ICD-10-CM | POA: Diagnosis not present

## 2015-11-05 DIAGNOSIS — I509 Heart failure, unspecified: Secondary | ICD-10-CM | POA: Diagnosis not present

## 2015-11-05 DIAGNOSIS — D631 Anemia in chronic kidney disease: Secondary | ICD-10-CM | POA: Diagnosis present

## 2015-11-05 DIAGNOSIS — N179 Acute kidney failure, unspecified: Secondary | ICD-10-CM | POA: Diagnosis present

## 2015-11-05 DIAGNOSIS — G939 Disorder of brain, unspecified: Secondary | ICD-10-CM | POA: Diagnosis present

## 2015-11-05 DIAGNOSIS — G9389 Other specified disorders of brain: Secondary | ICD-10-CM | POA: Insufficient documentation

## 2015-11-05 DIAGNOSIS — R600 Localized edema: Secondary | ICD-10-CM | POA: Insufficient documentation

## 2015-11-05 DIAGNOSIS — I130 Hypertensive heart and chronic kidney disease with heart failure and stage 1 through stage 4 chronic kidney disease, or unspecified chronic kidney disease: Secondary | ICD-10-CM | POA: Diagnosis present

## 2015-11-05 DIAGNOSIS — Z7902 Long term (current) use of antithrombotics/antiplatelets: Secondary | ICD-10-CM | POA: Diagnosis not present

## 2015-11-05 DIAGNOSIS — Z8249 Family history of ischemic heart disease and other diseases of the circulatory system: Secondary | ICD-10-CM | POA: Diagnosis not present

## 2015-11-05 DIAGNOSIS — Z9114 Patient's other noncompliance with medication regimen: Secondary | ICD-10-CM | POA: Diagnosis not present

## 2015-11-05 DIAGNOSIS — Z833 Family history of diabetes mellitus: Secondary | ICD-10-CM | POA: Diagnosis not present

## 2015-11-05 DIAGNOSIS — Z7982 Long term (current) use of aspirin: Secondary | ICD-10-CM | POA: Diagnosis not present

## 2015-11-05 DIAGNOSIS — G40909 Epilepsy, unspecified, not intractable, without status epilepticus: Secondary | ICD-10-CM | POA: Diagnosis present

## 2015-11-05 DIAGNOSIS — M7989 Other specified soft tissue disorders: Secondary | ICD-10-CM | POA: Diagnosis present

## 2015-11-05 DIAGNOSIS — E875 Hyperkalemia: Secondary | ICD-10-CM | POA: Diagnosis not present

## 2015-11-05 DIAGNOSIS — I739 Peripheral vascular disease, unspecified: Secondary | ICD-10-CM | POA: Diagnosis present

## 2015-11-05 DIAGNOSIS — N189 Chronic kidney disease, unspecified: Secondary | ICD-10-CM | POA: Insufficient documentation

## 2015-11-05 LAB — LACTATE DEHYDROGENASE: LDH: 175 U/L (ref 98–192)

## 2015-11-05 LAB — BASIC METABOLIC PANEL
Anion gap: 9 (ref 5–15)
BUN: 27 mg/dL — AB (ref 6–20)
CALCIUM: 8.8 mg/dL — AB (ref 8.9–10.3)
CHLORIDE: 105 mmol/L (ref 101–111)
CO2: 27 mmol/L (ref 22–32)
CREATININE: 2.18 mg/dL — AB (ref 0.61–1.24)
GFR calc Af Amer: 35 mL/min — ABNORMAL LOW (ref >60)
GFR, EST NON AFRICAN AMERICAN: 30 mL/min — AB (ref >60)
Glucose, Bld: 106 mg/dL — ABNORMAL HIGH (ref 65–99)
Potassium: 4 mmol/L (ref 3.5–5.1)
SODIUM: 141 mmol/L (ref 135–145)

## 2015-11-05 LAB — MAGNESIUM: MAGNESIUM: 1.4 mg/dL — AB (ref 1.7–2.4)

## 2015-11-05 LAB — TSH: TSH: 1.814 u[IU]/mL (ref 0.350–4.500)

## 2015-11-05 LAB — PROTEIN / CREATININE RATIO, URINE
Creatinine, Urine: 21.92 mg/dL
Total Protein, Urine: <6 mg/dL

## 2015-11-05 LAB — FERRITIN: Ferritin: 32 ng/mL (ref 24–336)

## 2015-11-05 LAB — TROPONIN I
TROPONIN I: 0.03 ng/mL — AB (ref <0.03)
Troponin I: 0.03 ng/mL (ref <0.03)

## 2015-11-05 LAB — CBC
HCT: 31 % — ABNORMAL LOW (ref 39.0–52.0)
Hemoglobin: 9.8 g/dL — ABNORMAL LOW (ref 13.0–17.0)
MCH: 30.8 pg (ref 26.0–34.0)
MCHC: 31.6 g/dL (ref 30.0–36.0)
MCV: 97.5 fL (ref 78.0–100.0)
PLATELETS: 237 10*3/uL (ref 150–400)
RBC: 3.18 MIL/uL — ABNORMAL LOW (ref 4.22–5.81)
RDW: 15.5 % (ref 11.5–15.5)
WBC: 5.3 10*3/uL (ref 4.0–10.5)

## 2015-11-05 LAB — RETICULOCYTES
RBC.: 3.1 MIL/uL — AB (ref 4.22–5.81)
RETIC COUNT ABSOLUTE: 133.3 10*3/uL (ref 19.0–186.0)
RETIC CT PCT: 4.3 % — AB (ref 0.4–3.1)

## 2015-11-05 LAB — MRSA PCR SCREENING: MRSA by PCR: NEGATIVE

## 2015-11-05 LAB — D-DIMER, QUANTITATIVE: D-Dimer, Quant: 1.83 ug/mL-FEU — ABNORMAL HIGH (ref 0.00–0.50)

## 2015-11-05 LAB — VITAMIN B12: Vitamin B-12: 282 pg/mL (ref 180–914)

## 2015-11-05 LAB — PROCALCITONIN

## 2015-11-05 MED ORDER — TECHNETIUM TC 99M DIETHYLENETRIAME-PENTAACETIC ACID: 30.0000 | Freq: Once | INTRAVENOUS | Status: DC | PRN

## 2015-11-05 MED ORDER — MAGNESIUM SULFATE 2 GM/50ML IV SOLN
2.0000 g | Freq: Once | INTRAVENOUS | Status: AC
  Administered 2015-11-05: 2 g via INTRAVENOUS
  Filled 2015-11-05: qty 50

## 2015-11-05 MED ORDER — TECHNETIUM TO 99M ALBUMIN AGGREGATED
4.1000 | Freq: Once | INTRAVENOUS | Status: AC | PRN
  Administered 2015-11-05: 4 via INTRAVENOUS

## 2015-11-05 NOTE — Evaluation (Signed)
Physical Therapy Evaluation Patient Details Name: Aristides Gross MRN: [id number] DOB: June 28, 1950 Today's Date: 11/05/2015   History of Present Illness  65 yo male admitted with CHF. hx of neck sg, R fem-pop bypass, PVD, CHF, Sz. D/c 7/4 to SNF.   Clinical Impression  On eval, pt required Min assist for mobility. He was able to walk ~135 feet with RW. VCs for safety, pacing during session. Noted coordination deficits, particularly R side. O2 sats 97% on RA at rest after ambulation so did not put pt back on [place] O2-made RN aware. Recommend return to SNF to complete rehab.     Follow Up Recommendations SNF    Equipment Recommendations  None recommended by PT    Recommendations for Other Services       Precautions / Restrictions Precautions Precautions: Fall Precaution Comments: seizure Restrictions Weight Bearing Restrictions: No      Mobility  Bed Mobility Overal bed mobility: Needs Assistance Bed Mobility: Supine to Sit;Sit to Supine     Supine to sit: Min assist;HOB elevated Sit to supine: Min assist;HOB elevated   General bed mobility comments: Intermittent assist for R LE. Increased time.   Transfers Overall transfer level: Needs assistance Equipment used: Rolling walker (2 wheeled) Transfers: Sit to/from Stand Sit to Stand: Min assist         General transfer comment: Assist to rise, stabilize, control descent. VCs safety, hand placement  Ambulation/Gait Ambulation/Gait assistance: Min assist Ambulation Distance (Feet): 135 Feet Assistive device: Rolling walker (2 wheeled) Gait Pattern/deviations: Step-to pattern;Step-through pattern;Decreased step length - right;Decreased stride length     General Gait Details: Intermittent lagging of R LE. VCs safety, pacing. Assist to stabilize pt.   Stairs            Wheelchair Mobility    Modified Rankin (Stroke Patients Only)       Balance Overall balance assessment: Needs assistance          Standing balance support: During functional activity                                 Pertinent Vitals/Pain Pain Assessment: No/denies pain    Home Living Family/patient expects to be discharged to:: Skilled nursing facility                      Prior Function Level of Independence: Needs assistance         Comments: ambulatory with RW with therapy at SNF     Hand Dominance        Extremity/Trunk Assessment   Upper Extremity Assessment: Generalized weakness           Lower Extremity Assessment: RLE deficits/detail RLE Deficits / Details: strength grossly at least 3/5 throughout. Noted some coordination deficits LLE Deficits / Details: Strength grossly 4/5  Cervical / Trunk Assessment: Normal  Communication      Cognition Arousal/Alertness: Awake/alert Behavior During Therapy: WFL for tasks assessed/performed Overall Cognitive Status: Within Functional Limits for tasks assessed                      General Comments      Exercises        Assessment/Plan    PT Assessment Patient needs continued PT services  PT Diagnosis Difficulty walking   PT Problem List Decreased strength;Decreased range of motion;Decreased balance;Decreased mobility;Decreased knowledge of use of DME;Pain  PT Treatment Interventions DME  instruction;Gait training;Functional mobility training;Therapeutic activities;Patient/family education;Balance training;Therapeutic exercise   PT Goals (Current goals can be found in the Care Plan section) Acute Rehab PT Goals Patient Stated Goal: To regain independence PT Goal Formulation: With patient Time For Goal Achievement: 11/19/15 Potential to Achieve Goals: Good    Frequency Min 2X/week   Barriers to discharge        Co-evaluation               End of Session Equipment Utilized During Treatment: Gait belt Activity Tolerance: Patient tolerated treatment well Patient left: in bed;with call  bell/phone within reach;with bed alarm set      Functional Assessment Tool Used: clinical judgement Functional Limitation: Mobility: Walking and moving around Mobility: Walking and Moving Around Current Status [id number]): At least 20 percent but less than 40 percent impaired, limited or restricted Mobility: Walking and Moving Around Goal Status [phone]): At least 1 percent but less than 20 percent impaired, limited or restricted    Time: [id number] PT Time Calculation (min) (ACUTE ONLY): 18 min   Charges:   PT Evaluation $PT Eval Low Complexity: 1 Procedure     PT G Codes:   PT G-Codes **NOT FOR INPATIENT CLASS** Functional Assessment Tool Used: clinical judgement Functional Limitation: Mobility: Walking and moving around Mobility: Walking and Moving Around Current Status [id number]): At least 20 percent but less than 40 percent impaired, limited or restricted Mobility: Walking and Moving Around Goal Status [phone]): At least 1 percent but less than 20 percent impaired, limited or restricted    Weston Anna, MPT Pager: [phone]

## 2015-11-05 NOTE — Progress Notes (Addendum)
PROGRESS NOTE    Paul Mullins  [name] DOB: 1950/11/25 DOA: 11/04/2015 PCP: Alfonso Patten, MD   Brief Narrative:  Paul Mullins is a 65 y.o. male with a past medical history significant for HTN, PVD s/p RIGHT fem-pop bypass in 2014, CKD baseline Cr 1.8, chronic diastolic CHF, and recently diagnosed brain neoplasm c/b seizures who presents with 20 lbs weight gain and leg swelling since his last hospitalization.  Since his discharge to [facility] 2 weeks ago, family had noticed gradually worsening leg edema. The swelling is worse on the RIGHT, also associated with hand puffiness, and today and yesterday was noticeably worse so family were concerned that it might prevent him from having surgery and brought him to the ER   Assessment & Plan:   Principal Problem:   Acute on chronic diastolic CHF (congestive heart failure), NYHA class 1 (HCC) - We'll obtain echocardiogram - Continue current Lasix regimen  Active Problems:   Essential hypertension - Patient currently on amlodipine    Seizure disorder ([place]) - Stable continue current regimen    Neoplasm - Patient to continue prior to admission plan developed by his oncologist/physicians    Normocytic anemia - Stable no active bleeding    Acute-on-chronic kidney injury ([place]) - Stable at 2.0  DVT prophylaxis: Lovenox Code Status: Full Family Communication: Discussed with patient Disposition Plan: Pending improvement in condition and results of echocardiogram   Consultants:   None   Procedures: Echocardiogram pending   Antimicrobials: None   Subjective: Patient has no new complaints. No acute issues overnight. He denies any shortness of breath.  Objective: Filed Vitals:   11/04/15 2100 11/04/15 2137 11/05/15 0539 11/05/15 1427  BP: 136/63 143/64 149/69 109/54  Pulse: 73 75 79 70  Temp:  98.7 F (37.1 C) 98.2 F (36.8 C) 98.1 F (36.7 C)  TempSrc:  Oral Oral Oral  Resp: 17 18 20    Height:  5'  4" (1.626 m)    Weight:  87.7 kg (193 lb 5.5 oz) 89.2 kg (196 lb 10.4 oz)   SpO2: 95% 100% 98%     Intake/Output Summary (Last 24 hours) at 11/05/15 1706 Last data filed at 11/05/15 0700  Gross per 24 hour  Intake    410 ml  Output   2675 ml  Net  -2265 ml   Filed Weights   11/04/15 2137 11/05/15 0539  Weight: 87.7 kg (193 lb 5.5 oz) 89.2 kg (196 lb 10.4 oz)    Examination:  General exam: Appears calm and comfortable  Respiratory system: Clear to auscultation. Respiratory effort normal. Cardiovascular system: S1 & S2 heard, RRR. No JVD, murmurs, rubs, gallops or clicks. Positive edema. Gastrointestinal system: Abdomen is nondistended, soft and nontender. No organomegaly or masses felt. Normal bowel sounds heard. Central nervous system: Alert and oriented. No focal neurological deficits. Extremities: Symmetric 5 x 5 power. Skin: No rashes, lesions or ulcers Psychiatry: Judgement and insight appear normal. Mood & affect appropriate.   Data Reviewed: I have personally reviewed following labs and imaging studies  CBC:  Recent Labs Lab 11/04/15 1519 11/05/15 0410  WBC 5.7 5.3  HGB 9.2* 9.8*  HCT 29.5* 31.0*  MCV 98.0 97.5  PLT 212 [id number]   Basic Metabolic Panel:  Recent Labs Lab 11/04/15 1542 11/04/15 2330 11/05/15 0410  NA 140  --  141  K 3.8  --  4.0  CL 106  --  105  CO2 28  --  27  GLUCOSE 94  --  106*  BUN 26*  --  27*  CREATININE 2.18*  --  2.18*  CALCIUM 8.6*  --  8.8*  MG  --  1.4*  --    GFR: Estimated Creatinine Clearance: 33.6 mL/min (by C-G formula based on Cr of 2.18). Liver Function Tests:  Recent Labs Lab 11/04/15 1542  AST 30  ALT 39  ALKPHOS 85  BILITOT 0.2*  PROT 7.6  ALBUMIN 3.3*   No results for input(s): LIPASE, AMYLASE in the last 168 hours. No results for input(s): AMMONIA in the last 168 hours. Coagulation Profile: No results for input(s): INR, PROTIME in the last 168 hours. Cardiac Enzymes:  Recent Labs Lab  11/04/15 2330 11/05/15 0410  TROPONINI 0.03* 0.03*   BNP (last 3 results) No results for input(s): PROBNP in the last 8760 hours. HbA1C: No results for input(s): HGBA1C in the last 72 hours. CBG: No results for input(s): GLUCAP in the last 168 hours. Lipid Profile: No results for input(s): CHOL, HDL, LDLCALC, TRIG, CHOLHDL, LDLDIRECT in the last 72 hours. Thyroid Function Tests:  Recent Labs  11/04/15 2330  TSH 1.814   Anemia Panel:  Recent Labs  11/04/15 2330  VITAMINB12 282  FERRITIN 32  RETICCTPCT 4.3*   Sepsis Labs:  Recent Labs Lab 11/04/15 2330  PROCALCITON <0.10    Recent Results (from the past 240 hour(s))  MRSA PCR Screening     Status: None   Collection Time: 11/04/15  9:50 PM  Result Value Ref Range Status   MRSA by PCR NEGATIVE NEGATIVE Final    Comment:        The GeneXpert MRSA Assay (FDA approved for NASAL specimens only), is one component of a comprehensive MRSA colonization surveillance program. It is not intended to diagnose MRSA infection nor to guide or monitor treatment for MRSA infections.          Radiology Studies: Nm Pulmonary Perf And Vent  11/05/2015  CLINICAL DATA:  Worsening lower extremity swelling for 2 weeks. EXAM: NUCLEAR MEDICINE VENTILATION - PERFUSION LUNG SCAN TECHNIQUE: Ventilation images were obtained in multiple projections using inhaled aerosol Tc-72m DTPA. Perfusion images were obtained in multiple projections after intravenous injection of Tc-80m MAA. RADIOPHARMACEUTICALS:  30.0 mCi Technetium-71m DTPA aerosol inhalation and 4.1 mCi Technetium-5m MAA IV COMPARISON:  Chest radiograph on 11/04/2015 FINDINGS: Ventilation: No focal ventilation defect. Perfusion: No wedge shaped peripheral perfusion defects to suggest acute pulmonary embolism. IMPRESSION: No evidence of pulmonary embolism. Electronically Signed   By: Earle Gell M.D.   On: 11/05/2015 13:07   Dg Chest Port 1 View  11/04/2015  CLINICAL DATA:   Bilateral leg swelling. History of congestive heart failure. Hypertension. EXAM: PORTABLE CHEST 1 VIEW COMPARISON:  Chest radiograph 10/11/2015 FINDINGS: There is shallow lung inflation. Cardiomediastinal silhouette is enlarged. There are bilateral perihilar opacities with peribronchial cuffing. No sizable pleural effusion on this semi upright radiograph. No pneumothorax. IMPRESSION: 1. Moderate pulmonary edema. 2. Enlarged cardiomediastinal silhouette, which may indicate pericardial effusion versus cardiomegaly alone. The appearance may also be exaggerated by a combination of AP technique and shallow lung inflation. Electronically Signed   By: Ulyses Jarred M.D.   On: 11/04/2015 16:14        Scheduled Meds: . amLODipine  10 mg Oral Daily  . antiseptic oral rinse  7 mL Mouth Rinse q12n4p  . aspirin EC  81 mg Oral Daily  . atorvastatin  40 mg Oral QPM  . chlorhexidine  15 mL Mouth Rinse BID  . cloBAZam  20  mg Oral BID  . cloNIDine  0.2 mg Oral BID  . clopidogrel  75 mg Oral QPM  . enoxaparin (LOVENOX) injection  40 mg Subcutaneous Q24H  . furosemide  40 mg Intravenous BID  . gabapentin  300 mg Oral TID  . isosorbide mononitrate  90 mg Oral Daily  . labetalol  200 mg Oral BID  . lacosamide  200 mg Oral Q12H  . levETIRAcetam  750 mg Oral BID  . pantoprazole  40 mg Oral Daily  . potassium chloride  40 mEq Oral BID  . sodium chloride flush  3 mL Intravenous Q12H   Continuous Infusions:   Time spent: > 35 minutes  Velvet Bathe, MD [facility] Pager [phone]   If 7PM-7AM, please contact night-coverage [email or web address] Password [facility] 11/05/2015, 5:06 PM   Addendum VQ scan reviewed and reports no evidence of PE

## 2015-11-05 NOTE — Progress Notes (Signed)
CRITICAL VALUE ALERT  Critical value received:  Troponin 0.03  Date of notification:  11/05/15  Time of notification:  0034  Critical value read back:Yes.    Nurse who received alert:  Virgina Norfolk  MD notified (1st page):  Baltazar Najjar  Time of first page:  0035  MD notified (2nd page):  Time of second page:  Responding MD:  No response  Time MD responded:  N/A

## 2015-11-05 NOTE — Clinical Social Work Note (Signed)
Clinical Social Work Assessment  Patient Details  Name: Paul Mullins MRN: [id number] Date of Birth: 02/28/1951  Date of referral:  11/05/15               Reason for consult:  Discharge Planning                Permission sought to share information with:  Facility Art therapist granted to share information::  Yes, Release of Information Signed  Name::        Agency::     Relationship::     Contact Information:     Housing/Transportation Living arrangements for the past 2 months:  [place] of Information:  Patient Patient Interpreter Needed:  None Criminal Activity/Legal Involvement Pertinent to Current Situation/Hospitalization:    Significant Relationships:    Lives with:  Self Do you feel safe going back to the place where you live?  No Need for family participation in patient care:  Yes (Comment)  Care giving concerns:  CSW received consult to discuss discharge plans.    Social Worker assessment / plan: CSW spoke with patient at bedside about his discharge plan and needs. Patient is from [facility]. Patient was receiving short-term rehab and came to the ER. Patient states that he would like to return to [place] once he is ready to discharge.   Employment status:  Disabled (Comment on whether or not currently receiving Disability) Insurance information:  Managed Medicare PT Recommendations:  Not assessed at this time Information / Referral to community resources:  [place]  Patient/Family's Response to care: Patient was apprecaitive of CSW.   Patient/Family's Understanding of and Emotional Response to Diagnosis, Current Treatment, and Prognosis: Patient did not have many questions about his current discharge plans and did not want to look into any other rehab facilities. Patient seemed to understand his current treatment and the process to return to short-term rehab.   Emotional  Assessment Appearance:  Appears stated age Attitude/Demeanor/Rapport:    Affect (typically observed):  Stable, Accepting, Appropriate Orientation:  Oriented to Self, Oriented to Place, Oriented to  Time, Oriented to Situation Alcohol / Substance use:    Psych involvement (Current and /or in the community):  No (Comment)  Discharge Needs  Concerns to be addressed:  Discharge Planning Concerns Readmission within the last 30 days:  No Current discharge risk:  None Barriers to Discharge:  No Barriers Identified   Weston Anna, LCSW 11/05/2015, 2:48 PM

## 2015-11-06 ENCOUNTER — Inpatient Hospital Stay (HOSPITAL_COMMUNITY): Payer: Medicare HMO

## 2015-11-06 DIAGNOSIS — I509 Heart failure, unspecified: Secondary | ICD-10-CM

## 2015-11-06 LAB — BASIC METABOLIC PANEL
ANION GAP: 9 (ref 5–15)
BUN: 30 mg/dL — ABNORMAL HIGH (ref 6–20)
CALCIUM: 8.5 mg/dL — AB (ref 8.9–10.3)
CO2: 26 mmol/L (ref 22–32)
Chloride: 103 mmol/L (ref 101–111)
Creatinine, Ser: 2.45 mg/dL — ABNORMAL HIGH (ref 0.61–1.24)
GFR, EST AFRICAN AMERICAN: 30 mL/min — AB (ref >60)
GFR, EST NON AFRICAN AMERICAN: 26 mL/min — AB (ref >60)
Glucose, Bld: 100 mg/dL — ABNORMAL HIGH (ref 65–99)
Potassium: 4.9 mmol/L (ref 3.5–5.1)
SODIUM: 138 mmol/L (ref 135–145)

## 2015-11-06 LAB — FOLATE RBC
Folate, Hemolysate: 505.5 ng/mL
Folate, RBC: 1799 ng/mL (ref >498)
Hematocrit: 28.1 % — ABNORMAL LOW (ref 37.5–51.0)

## 2015-11-06 LAB — PROCALCITONIN: Procalcitonin: <0.1 ng/mL

## 2015-11-06 LAB — HAPTOGLOBIN: HAPTOGLOBIN: 175 mg/dL (ref 34–200)

## 2015-11-06 LAB — ECHOCARDIOGRAM COMPLETE
HEIGHTINCHES: 64 in
WEIGHTICAEL: 3104.08 [oz_av]

## 2015-11-06 MED ORDER — FUROSEMIDE 40 MG PO TABS
40.0000 mg | ORAL_TABLET | Freq: Every day | ORAL | Status: DC
  Administered 2015-11-07: 40 mg via ORAL
  Filled 2015-11-06: qty 1

## 2015-11-06 NOTE — Progress Notes (Signed)
  Echocardiogram 2D Echocardiogram has been performed.  Paul Mullins 11/06/2015, 2:40 PM

## 2015-11-06 NOTE — NC FL2 (Addendum)
[place] MEDICAID FL2 LEVEL OF CARE SCREENING TOOL     IDENTIFICATION  Patient Name: Paul Mullins Birthdate: 11/29/1950 Sex: male Admission Date (Current Location): 11/04/2015  [facility] and [place] Number:  Herbalist and Address:  [facility]  [address] [place], [place] [address] Provider Number: [phone]  Attending Physician Name and Address:  Velvet Bathe, MD  Relative Name and Phone Number:       Current Level of Care: Hospital Recommended Level of Care: ALF Prior Approval Number:    Date Approved/Denied:   PASRR Number: [id number]  Discharge Plan:    Current Diagnoses: Patient Active Problem List   Diagnosis Date Noted  . AKI (acute kidney injury) ([place])   . Bilateral leg edema   . Brain mass   . CRI (chronic renal insufficiency)   . Leg swelling   . Normocytic anemia 11/04/2015  . Acute-on-chronic kidney injury ([place]) 11/04/2015  . Seizure disorder ([place])   . Acute on chronic diastolic CHF (congestive heart failure), NYHA class 1 ([place])   . Coronary artery disease involving native coronary artery of native heart without angina pectoris   . Neoplasm   . Essential hypertension 10/11/2015  . CKD (chronic kidney disease) 10/11/2015  . 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 PVD (peripheral vascular disease) ([place]) 08/07/2012    Orientation RESPIRATION BLADDER Height & Weight     Self, Time, Situation, Place  Normal Incontinent Weight: 194 lb 0.1 oz (88 kg) Height:  5\' 4"  (162.6 cm)  BEHAVIORAL SYMPTOMS/MOOD NEUROLOGICAL BOWEL NUTRITION STATUS      Continent  (Heart healthy)  AMBULATORY STATUS COMMUNICATION OF NEEDS Skin   Limited Assist Verbally Normal                       Personal Care Assistance Level of Assistance  Bathing, Dressing Bathing Assistance: Limited assistance   Dressing Assistance: Limited assistance     Functional Limitations Info             SPECIAL  CARE FACTORS FREQUENCY  PT (By licensed PT), OT (By licensed OT)                 Contractures      Additional Factors Info  Code Status, Allergies Code Status Info: Full Code  Allergies Info: Allergies:  Ace Inhibitors            Discharge Medications: Current Discharge Medication List       CONTINUE these medications which have NOT CHANGED   Details  amLODipine (NORVASC) 10 MG tablet Take 10 mg by mouth daily.    aspirin EC 81 MG tablet Take 81 mg by mouth daily.    atorvastatin (LIPITOR) 40 MG tablet Take 40 mg by mouth daily.    cloBAZam (ONFI) 20 MG tablet Take 1 tablet (20 mg total) by mouth 2 (two) times daily.    cloNIDine (CATAPRES) 0.2 MG tablet Take 0.2 mg by mouth 2 (two) times daily.     clopidogrel (PLAVIX) 75 MG tablet Take 75 mg by mouth every evening.     furosemide (LASIX) 20 MG tablet Take 1 tablet (20 mg total) by mouth daily.    gabapentin (NEURONTIN) 300 MG capsule Take 300 mg by mouth 3 (three) times daily.    isosorbide mononitrate (IMDUR) 30 MG 24 hr tablet Take 90 mg by mouth daily.    labetalol (NORMODYNE) 200 MG tablet Take 200 mg  by mouth 2 (two) times daily.    lacosamide (VIMPAT) 200 MG TABS tablet Take 1 tablet (200 mg total) by mouth every 12 (twelve) hours.    levETIRAcetam (KEPPRA) 750 MG tablet Take 1 tablet (750 mg total) by mouth 2 (two) times daily.    nitroGLYCERIN (NITROSTAT) 0.4 MG SL tablet Place 0.4 mg under the tongue every 5 (five) minutes as needed for chest pain.    pantoprazole (PROTONIX) 40 MG tablet Take 40 mg by mouth daily.    polyethylene glycol (MIRALAX / GLYCOLAX) packet Take 17 g by mouth daily as needed. Qty: 14 each, Refills: 0       Relevant Imaging Results:  Relevant Lab Results:   Additional Information SS# [id number]  Weston Anna, LCSW

## 2015-11-06 NOTE — Progress Notes (Signed)
PROGRESS NOTE    Paul Mullins  [id number] DOB: April 23, 1950 DOA: 11/04/2015 PCP: Alfonso Patten, MD   Brief Narrative:  Paul Mullins is a 65 y.o. male with a past medical history significant for HTN, PVD s/p RIGHT fem-pop bypass in 2014, CKD baseline Cr 1.8, chronic diastolic CHF, and recently diagnosed brain neoplasm c/b seizures who presents with 20 lbs weight gain and leg swelling since his last hospitalization.  Since his discharge to [facility] 2 weeks ago, family had noticed gradually worsening leg edema. The swelling is worse on the RIGHT, also associated with hand puffiness, and today and yesterday was noticeably worse so family were concerned that it might prevent him from having surgery and brought him to the ER  Assessment & Plan:   Principal Problem:   Acute on chronic diastolic CHF (congestive heart failure), NYHA class 1 ([place]) - echocardiogram reporting EF of 45 to 50 percent - Had 2 days of IV lasix. Will transition after today to oral lasix.   Active Problems:   Essential hypertension - Patient currently on amlodipine    Seizure disorder ([place]) - Stable continue current regimen    Neoplasm - Patient to continue prior to admission plan developed by his oncologist/physicians    Normocytic anemia - Stable no active bleeding    Acute-on-chronic kidney injury ([place]) - Stable at 2.0  DVT prophylaxis: Lovenox Code Status: Full Family Communication: Discussed with patient Disposition Plan: Pending improvement in condition and results of echocardiogram   Consultants:   None   Procedures: Echocardiogram pending   Antimicrobials: None   Subjective: Pt reports improvement in edema.   Objective: Filed Vitals:   11/06/15 0609 11/06/15 0959 11/06/15 1259 11/06/15 1506  BP: 137/63 145/67 95/46 123/56  Pulse: 75 79 73 75  Temp: 97.5 F (36.4 C)  97.9 F (36.6 C)   TempSrc: Axillary  Axillary   Resp: 17  16   Height:      Weight: 88 kg (194  lb 0.1 oz)     SpO2: 91%  97%     Intake/Output Summary (Last 24 hours) at 11/06/15 1804 Last data filed at 11/06/15 1230  Gross per 24 hour  Intake    840 ml  Output    900 ml  Net    -60 ml   Filed Weights   11/04/15 2137 11/05/15 0539 11/06/15 0609  Weight: 87.7 kg (193 lb 5.5 oz) 89.2 kg (196 lb 10.4 oz) 88 kg (194 lb 0.1 oz)    Examination:  General exam: Appears calm and comfortable , alert and awake Respiratory system: Clear to auscultation. Respiratory effort normal. Cardiovascular system: S1 & S2 heard, RRR. No JVD, murmurs, rubs, gallops or clicks. Positive edema. Gastrointestinal system: Abdomen is nondistended, soft and nontender. No organomegaly or masses felt. Normal bowel sounds heard. Central nervous system: Alert and oriented. No focal neurological deficits. Extremities: Symmetric 5 x 5 power. Skin: No rashes, lesions or ulcers Psychiatry: Judgement and insight appear normal. Mood & affect appropriate.   Data Reviewed: I have personally reviewed following labs and imaging studies  CBC:  Recent Labs Lab 11/04/15 1519 11/04/15 2330 11/05/15 0410  WBC 5.7  --  5.3  HGB 9.2*  --  9.8*  HCT 29.5* 28.1* 31.0*  MCV 98.0  --  97.5  PLT 212  --  123XX123   Basic Metabolic Panel:  Recent Labs Lab 11/04/15 1542 11/04/15 2330 11/05/15 0410 11/06/15 0524  NA 140  --  141 138  K  3.8  --  4.0 4.9  CL 106  --  105 103  CO2 28  --  27 26  GLUCOSE 94  --  106* 100*  BUN 26*  --  27* 30*  CREATININE 2.18*  --  2.18* 2.45*  CALCIUM 8.6*  --  8.8* 8.5*  MG  --  1.4*  --   --    GFR: Estimated Creatinine Clearance: 29.7 mL/min (by C-G formula based on Cr of 2.45). Liver Function Tests:  Recent Labs Lab 11/04/15 1542  AST 30  ALT 39  ALKPHOS 85  BILITOT 0.2*  PROT 7.6  ALBUMIN 3.3*   No results for input(s): LIPASE, AMYLASE in the last 168 hours. No results for input(s): AMMONIA in the last 168 hours. Coagulation Profile: No results for input(s): INR,  PROTIME in the last 168 hours. Cardiac Enzymes:  Recent Labs Lab 11/04/15 2330 11/05/15 0410  TROPONINI 0.03* 0.03*   BNP (last 3 results) No results for input(s): PROBNP in the last 8760 hours. HbA1C: No results for input(s): HGBA1C in the last 72 hours. CBG: No results for input(s): GLUCAP in the last 168 hours. Lipid Profile: No results for input(s): CHOL, HDL, LDLCALC, TRIG, CHOLHDL, LDLDIRECT in the last 72 hours. Thyroid Function Tests:  Recent Labs  11/04/15 2330  TSH 1.814   Anemia Panel:  Recent Labs  11/04/15 2330  VITAMINB12 282  FERRITIN 32  RETICCTPCT 4.3*   Sepsis Labs:  Recent Labs Lab 11/04/15 2330 11/06/15 0524  PROCALCITON <0.10 <0.10    Recent Results (from the past 240 hour(s))  MRSA PCR Screening     Status: None   Collection Time: 11/04/15  9:50 PM  Result Value Ref Range Status   MRSA by PCR NEGATIVE NEGATIVE Final    Comment:        The GeneXpert MRSA Assay (FDA approved for NASAL specimens only), is one component of a comprehensive MRSA colonization surveillance program. It is not intended to diagnose MRSA infection nor to guide or monitor treatment for MRSA infections.          Radiology Studies: Nm Pulmonary Perf And Vent  11/05/2015  CLINICAL DATA:  Worsening lower extremity swelling for 2 weeks. EXAM: NUCLEAR MEDICINE VENTILATION - PERFUSION LUNG SCAN TECHNIQUE: Ventilation images were obtained in multiple projections using inhaled aerosol Tc-83m DTPA. Perfusion images were obtained in multiple projections after intravenous injection of Tc-56m MAA. RADIOPHARMACEUTICALS:  30.0 mCi Technetium-83m DTPA aerosol inhalation and 4.1 mCi Technetium-80m MAA IV COMPARISON:  Chest radiograph on 11/04/2015 FINDINGS: Ventilation: No focal ventilation defect. Perfusion: No wedge shaped peripheral perfusion defects to suggest acute pulmonary embolism. IMPRESSION: No evidence of pulmonary embolism. Electronically Signed   By: Myles Rosenthal  M.D.   On: 11/05/2015 13:07        Scheduled Meds: . amLODipine  10 mg Oral Daily  . antiseptic oral rinse  7 mL Mouth Rinse q12n4p  . aspirin EC  81 mg Oral Daily  . atorvastatin  40 mg Oral QPM  . chlorhexidine  15 mL Mouth Rinse BID  . cloBAZam  20 mg Oral BID  . cloNIDine  0.2 mg Oral BID  . clopidogrel  75 mg Oral QPM  . enoxaparin (LOVENOX) injection  40 mg Subcutaneous Q24H  . furosemide  40 mg Oral Daily  . gabapentin  300 mg Oral TID  . isosorbide mononitrate  90 mg Oral Daily  . labetalol  200 mg Oral BID  . lacosamide  200 mg  Oral Q12H  . levETIRAcetam  750 mg Oral BID  . pantoprazole  40 mg Oral Daily  . potassium chloride  40 mEq Oral BID  . sodium chloride flush  3 mL Intravenous Q12H   Continuous Infusions:   Time spent: > 35 minutes  Velvet Bathe, MD [facility] Pager [phone]   If 7PM-7AM, please contact night-coverage [email or web address] Password [facility] 11/06/2015, 6:04 PM   Addendum VQ scan reviewed and reports no evidence of PE

## 2015-11-06 NOTE — Clinical Documentation Improvement (Signed)
Hospitalist  Please update your documentation within the medical record to reflect your response to this query. Thank you  Can the diagnosis of CKD be further specified?   CKD Stage I - GFR greater than or equal to 90  CKD Stage II - GFR 60-89  CKD Stage III - GFR 30-59  CKD Stage IV - GFR 15-29  CKD Stage V - GFR < 15  ESRD (End Stage Renal Disease)  Other condition  Unable to clinically determine  Supporting Information: : (risk factors, signs and symptoms, diagnostics, treatment) 11/05/15 progr note..."Acute-on-chronic kidney injury"  Results for Buboltz, [place] (MRN [id number]) as of 11/06/2015 10:53  11/04/2015 15:42 11/05/2015 04:10 11/06/2015 05:24  EGFR (African American) 35 (L) 35 (L) 30 (L)   Please exercise your independent, professional judgment when responding. A specific answer is not anticipated or expected.  Thank You, Ophelia R Moore, RN, BSN, CCDS Certified Clinical Documentation Specialist [place]: Health Information Management [phone]  

## 2015-11-07 LAB — BASIC METABOLIC PANEL
ANION GAP: 9 (ref 5–15)
BUN: 32 mg/dL — ABNORMAL HIGH (ref 6–20)
CALCIUM: 8.8 mg/dL — AB (ref 8.9–10.3)
CO2: 26 mmol/L (ref 22–32)
Chloride: 103 mmol/L (ref 101–111)
Creatinine, Ser: 2.96 mg/dL — ABNORMAL HIGH (ref 0.61–1.24)
GFR calc non Af Amer: 21 mL/min — ABNORMAL LOW (ref >60)
GFR, EST AFRICAN AMERICAN: 24 mL/min — AB (ref >60)
Glucose, Bld: 99 mg/dL (ref 65–99)
Potassium: 5.5 mmol/L — ABNORMAL HIGH (ref 3.5–5.1)
SODIUM: 138 mmol/L (ref 135–145)

## 2015-11-07 LAB — POTASSIUM: POTASSIUM: 5.7 mmol/L — AB (ref 3.5–5.1)

## 2015-11-07 MED ORDER — SODIUM POLYSTYRENE SULFONATE 15 GM/60ML PO SUSP
15.0000 g | Freq: Once | ORAL | Status: AC
  Administered 2015-11-07: 15 g via ORAL
  Filled 2015-11-07: qty 60

## 2015-11-07 MED ORDER — ENOXAPARIN SODIUM 30 MG/0.3ML ~~LOC~~ SOLN
30.0000 mg | SUBCUTANEOUS | Status: DC
  Administered 2015-11-07 – 2015-11-10 (×4): 30 mg via SUBCUTANEOUS
  Filled 2015-11-07 (×4): qty 0.3

## 2015-11-07 NOTE — Progress Notes (Signed)
   11/07/15 0655  What Happened  Was fall witnessed? Yes  Who witnessed fall? [place] RN  Patients activity before fall ambulating-assisted  Point of contact back  Was patient injured? No  Follow Up  MD notified Walden Field NP  Time MD notified 0700  Family notified Yes-comment  Time family notified 0705  Additional tests No  Progress note created (see row info) Yes  Adult Fall Risk Assessment  Risk Factor Category (scoring not indicated) High fall risk per protocol (document High fall risk)  Patient's Fall Risk High Fall Risk (>13 points)  Adult Fall Risk Interventions  Required Bundle Interventions *See Row Information* High fall risk - low, moderate, and high requirements implemented  Additional Interventions Individualized elimination schedule;Use of appropriate toileting equipment (bedpan, BSC, etc.);PT/OT need assessed if change in mobility from baseline  Screening for Fall Injury Risk  Risk For Fall Injury- See Row Information  Nurse judgement  Vitals  Temp 98.8 F (37.1 C)  Temp Source Oral  BP (!) 165/57 mmHg  BP Location Left Arm  BP Method Automatic  Patient Position (if appropriate) Sitting  Pulse Rate 87  Pulse Rate Source Dinamap  Resp 20  Oxygen Therapy  SpO2 100 %  O2 Device Room Air  Pain Assessment  Pain Assessment No/denies pain  Pain Score 0

## 2015-11-07 NOTE — Progress Notes (Signed)
PROGRESS NOTE    [name]  [name] DOB: October 16, 1950 DOA: 11/04/2015 PCP: Alfonso Patten, MD   Brief Narrative:  Paul Mullins is a 65 y.o. male with a past medical history significant for HTN, PVD s/p RIGHT fem-pop bypass in 2014, CKD baseline Cr 1.8, chronic diastolic CHF, and recently diagnosed brain neoplasm c/b seizures who presents with 20 lbs weight gain and leg swelling since his last hospitalization.  Since his discharge to [facility] 2 weeks ago, family had noticed gradually worsening leg edema. The swelling is worse on the RIGHT, also associated with hand puffiness, and today and yesterday was noticeably worse so family were concerned that it might prevent him from having surgery and brought him to the ER  Assessment & Plan:   Principal Problem:   Acute on chronic diastolic CHF (congestive heart failure), NYHA class 1 ([place]) - echocardiogram reporting EF of 45 to 50 percent - Had 2 days of IV lasix. Transitioned to oral lasix but will hold due to ARF.   ARF - due to lasix administration most likely - Will hold lasix  Hyperkalemia - Will administer kayexalate and reassess next am.  - d/c potassium replacement  Active Problems:   Essential hypertension - Patient currently on amlodipine    Seizure disorder ([place]) - Stable continue current regimen    Neoplasm - Patient to continue prior to admission plan developed by his oncologist/physicians    Normocytic anemia - Stable no active bleeding    Acute-on-chronic kidney injury ([place]) - Stable at 2.0  DVT prophylaxis: Lovenox Code Status: Full Family Communication: Discussed with patient Disposition Plan: Pending improvement in condition and results of echocardiogram   Consultants:   None   Procedures: Echocardiogram pending   Antimicrobials: None   Subjective: Pt has no new complaints.  Objective: Filed Vitals:   11/07/15 0655 11/07/15 0854 11/07/15 1045 11/07/15 1419  BP: 165/57  117/55 116/60 118/54  Pulse: 87 79 70 71  Temp: 98.8 F (37.1 C)  97.8 F (36.6 C) 97.3 F (36.3 C)  TempSrc: Oral  Oral Oral  Resp: 20 18 18 16   Height:      Weight:      SpO2: 100% 100% 95% 100%    Intake/Output Summary (Last 24 hours) at 11/07/15 1453 Last data filed at 11/07/15 1338  Gross per 24 hour  Intake    540 ml  Output   2225 ml  Net  -1685 ml   Filed Weights   11/05/15 0539 11/06/15 0609 11/07/15 0511  Weight: 89.2 kg (196 lb 10.4 oz) 88 kg (194 lb 0.1 oz) 87.6 kg (193 lb 2 oz)    Examination:  General exam: Appears calm and comfortable , alert and awake Respiratory system: Clear to auscultation. Respiratory effort normal. Cardiovascular system: S1 & S2 heard, RRR. No JVD, murmurs, rubs, gallops or clicks. Positive edema. Gastrointestinal system: Abdomen is nondistended, soft and nontender. No organomegaly or masses felt. Normal bowel sounds heard. Central nervous system: Alert and oriented. No focal neurological deficits. Extremities: Symmetric 5 x 5 power. Skin: No rashes, lesions or ulcers Psychiatry: Judgement and insight appear normal. Mood & affect appropriate.   Data Reviewed: I have personally reviewed following labs and imaging studies  CBC:  Recent Labs Lab 11/04/15 1519 11/04/15 2330 11/05/15 0410  WBC 5.7  --  5.3  HGB 9.2*  --  9.8*  HCT 29.5* 28.1* 31.0*  MCV 98.0  --  97.5  PLT 212  --  237  Basic Metabolic Panel:  Recent Labs Lab 11/04/15 1542 11/04/15 2330 11/05/15 0410 11/06/15 0524 11/07/15 0531 11/07/15 1153  NA 140  --  141 138 138  --   K 3.8  --  4.0 4.9 5.5* 5.7*  CL 106  --  105 103 103  --   CO2 28  --  27 26 26   --   GLUCOSE 94  --  106* 100* 99  --   BUN 26*  --  27* 30* 32*  --   CREATININE 2.18*  --  2.18* 2.45* 2.96*  --   CALCIUM 8.6*  --  8.8* 8.5* 8.8*  --   MG  --  1.4*  --   --   --   --    GFR: Estimated Creatinine Clearance: 24.5 mL/min (by C-G formula based on Cr of 2.96). Liver Function  Tests:  Recent Labs Lab 11/04/15 1542  AST 30  ALT 39  ALKPHOS 85  BILITOT 0.2*  PROT 7.6  ALBUMIN 3.3*   No results for input(s): LIPASE, AMYLASE in the last 168 hours. No results for input(s): AMMONIA in the last 168 hours. Coagulation Profile: No results for input(s): INR, PROTIME in the last 168 hours. Cardiac Enzymes:  Recent Labs Lab 11/04/15 2330 11/05/15 0410  TROPONINI 0.03* 0.03*   BNP (last 3 results) No results for input(s): PROBNP in the last 8760 hours. HbA1C: No results for input(s): HGBA1C in the last 72 hours. CBG: No results for input(s): GLUCAP in the last 168 hours. Lipid Profile: No results for input(s): CHOL, HDL, LDLCALC, TRIG, CHOLHDL, LDLDIRECT in the last 72 hours. Thyroid Function Tests:  Recent Labs  11/04/15 2330  TSH 1.814   Anemia Panel:  Recent Labs  11/04/15 2330  VITAMINB12 282  FERRITIN 32  RETICCTPCT 4.3*   Sepsis Labs:  Recent Labs Lab 11/04/15 2330 11/06/15 0524  PROCALCITON <0.10 <0.10    Recent Results (from the past 240 hour(s))  MRSA PCR Screening     Status: None   Collection Time: 11/04/15  9:50 PM  Result Value Ref Range Status   MRSA by PCR NEGATIVE NEGATIVE Final    Comment:        The GeneXpert MRSA Assay (FDA approved for NASAL specimens only), is one component of a comprehensive MRSA colonization surveillance program. It is not intended to diagnose MRSA infection nor to guide or monitor treatment for MRSA infections.          Radiology Studies: No results found.      Scheduled Meds: . amLODipine  10 mg Oral Daily  . antiseptic oral rinse  7 mL Mouth Rinse q12n4p  . aspirin EC  81 mg Oral Daily  . atorvastatin  40 mg Oral QPM  . chlorhexidine  15 mL Mouth Rinse BID  . cloBAZam  20 mg Oral BID  . cloNIDine  0.2 mg Oral BID  . clopidogrel  75 mg Oral QPM  . enoxaparin (LOVENOX) injection  30 mg Subcutaneous Q24H  . gabapentin  300 mg Oral TID  . isosorbide mononitrate  90 mg  Oral Daily  . labetalol  200 mg Oral BID  . lacosamide  200 mg Oral Q12H  . levETIRAcetam  750 mg Oral BID  . pantoprazole  40 mg Oral Daily  . potassium chloride  40 mEq Oral BID  . sodium chloride flush  3 mL Intravenous Q12H  . sodium polystyrene  15 g Oral Once   Continuous Infusions:  Time spent: > 35 minutes  Velvet Bathe, MD [facility] Pager [phone]   If 7PM-7AM, please contact night-coverage [email or web address] Password [facility] 11/07/2015, 2:53 PM

## 2015-11-08 LAB — PROCALCITONIN

## 2015-11-08 LAB — BASIC METABOLIC PANEL
Anion gap: 10 (ref 5–15)
BUN: 43 mg/dL — AB (ref 6–20)
CALCIUM: 8.8 mg/dL — AB (ref 8.9–10.3)
CHLORIDE: 101 mmol/L (ref 101–111)
CO2: 27 mmol/L (ref 22–32)
CREATININE: 3.16 mg/dL — AB (ref 0.61–1.24)
GFR, EST AFRICAN AMERICAN: 22 mL/min — AB (ref >60)
GFR, EST NON AFRICAN AMERICAN: 19 mL/min — AB (ref >60)
Glucose, Bld: 114 mg/dL — ABNORMAL HIGH (ref 65–99)
Potassium: 4.9 mmol/L (ref 3.5–5.1)
SODIUM: 138 mmol/L (ref 135–145)

## 2015-11-08 MED ORDER — SODIUM CHLORIDE 0.9 % IV SOLN
Freq: Once | INTRAVENOUS | Status: AC
  Administered 2015-11-08: 10:00:00 via INTRAVENOUS

## 2015-11-08 NOTE — Progress Notes (Signed)
PROGRESS NOTE    Paul Mullins  [id number] DOB: Apr 04, 1951 DOA: 11/04/2015 PCP: Alfonso Patten, MD   Brief Narrative:  Paul Mullins is a 65 y.o. male with a past medical history significant for HTN, PVD s/p RIGHT fem-pop bypass in 2014, CKD baseline Cr 1.8, chronic diastolic CHF, and recently diagnosed brain neoplasm c/b seizures who presents with 20 lbs weight gain and leg swelling since his last hospitalization.  Since his discharge to [facility] 2 weeks ago, family had noticed gradually worsening leg edema. The swelling is worse on the RIGHT, also associated with hand puffiness, and today and yesterday was noticeably worse so family were concerned that it might prevent him from having surgery and brought him to the ER  Assessment & Plan:   Principal Problem:   Acute on chronic diastolic CHF (congestive heart failure), NYHA class 1 ([place]) - echocardiogram reporting EF of 45 to 50 percent - Had 2 days of IV lasix. Transitioned to oral lasix but will hold due to ARF.   ARF - due to lasix administration most likely - Will hold lasix and administer fluids judiciously.  Hyperkalemia - Will administer kayexalate and reassess next am.  - d/c potassium replacement - Has resolved with the above interventions.  Active Problems:   Essential hypertension - Patient currently on amlodipine    Seizure disorder ([place]) - Stable continue current regimen    Neoplasm - Patient to continue prior to admission plan developed by his oncologist/physicians    Normocytic anemia - Stable no active bleeding    Acute-on-chronic kidney injury ([place]) - Stable at 2.0  DVT prophylaxis: Lovenox Code Status: Full Family Communication: Discussed with patient Disposition Plan: Pending improvement in kidney function   Consultants:   None   Procedures: Echocardiogram pending   Antimicrobials: None   Subjective: Pt has no new complaints. No acute issues reported  overnight  Objective: Vitals:   11/07/15 1419 11/07/15 2219 11/08/15 0545 11/08/15 1526  BP: (!) 118/54 (!) 145/66 137/66 116/66  Pulse: 71 77 80 74  Resp: 16 18 18 18   Temp: 97.3 F (36.3 C) 97.5 F (36.4 C) 98.1 F (36.7 C) 98.3 F (36.8 C)  TempSrc: Oral Oral Oral Oral  SpO2: 100% 100% 99% 97%  Weight:   87.5 kg (192 lb 14.4 oz)   Height:        Intake/Output Summary (Last 24 hours) at 11/08/15 1552 Last data filed at 11/08/15 0915  Gross per 24 hour  Intake              840 ml  Output             1650 ml  Net             -810 ml   Filed Weights   11/06/15 0609 11/07/15 0511 11/08/15 0545  Weight: 88 kg (194 lb 0.1 oz) 87.6 kg (193 lb 2 oz) 87.5 kg (192 lb 14.4 oz)    Examination:  General exam: Appears calm and comfortable , alert and awake Respiratory system: Clear to auscultation. Respiratory effort normal. Cardiovascular system: S1 & S2 heard, RRR. No JVD, murmurs, rubs, gallops or clicks. Positive edema. Gastrointestinal system: Abdomen is nondistended, soft and nontender. No organomegaly or masses felt. Normal bowel sounds heard. Central nervous system: Alert and oriented. No focal neurological deficits. Extremities: Symmetric 5 x 5 power. Skin: No rashes, lesions or ulcers Psychiatry: Judgement and insight appear normal. Mood & affect appropriate.   Data Reviewed: I have personally  reviewed following labs and imaging studies  CBC:  Recent Labs Lab 11/04/15 1519 11/04/15 2330 11/05/15 0410  WBC 5.7  --  5.3  HGB 9.2*  --  9.8*  HCT 29.5* 28.1* 31.0*  MCV 98.0  --  97.5  PLT 212  --  123XX123   Basic Metabolic Panel:  Recent Labs Lab 11/04/15 1542 11/04/15 2330 11/05/15 0410 11/06/15 0524 11/07/15 0531 11/07/15 1153 11/08/15 0507  NA 140  --  141 138 138  --  138  K 3.8  --  4.0 4.9 5.5* 5.7* 4.9  CL 106  --  105 103 103  --  101  CO2 28  --  27 26 26   --  27  GLUCOSE 94  --  106* 100* 99  --  114*  BUN 26*  --  27* 30* 32*  --  43*   CREATININE 2.18*  --  2.18* 2.45* 2.96*  --  3.16*  CALCIUM 8.6*  --  8.8* 8.5* 8.8*  --  8.8*  MG  --  1.4*  --   --   --   --   --    GFR: Estimated Creatinine Clearance: 22.9 mL/min (by C-G formula based on SCr of 3.16 mg/dL). Liver Function Tests:  Recent Labs Lab 11/04/15 1542  AST 30  ALT 39  ALKPHOS 85  BILITOT 0.2*  PROT 7.6  ALBUMIN 3.3*   No results for input(s): LIPASE, AMYLASE in the last 168 hours. No results for input(s): AMMONIA in the last 168 hours. Coagulation Profile: No results for input(s): INR, PROTIME in the last 168 hours. Cardiac Enzymes:  Recent Labs Lab 11/04/15 2330 11/05/15 0410  TROPONINI 0.03* 0.03*   BNP (last 3 results) No results for input(s): PROBNP in the last 8760 hours. HbA1C: No results for input(s): HGBA1C in the last 72 hours. CBG: No results for input(s): GLUCAP in the last 168 hours. Lipid Profile: No results for input(s): CHOL, HDL, LDLCALC, TRIG, CHOLHDL, LDLDIRECT in the last 72 hours. Thyroid Function Tests: No results for input(s): TSH, T4TOTAL, FREET4, T3FREE, THYROIDAB in the last 72 hours. Anemia Panel: No results for input(s): VITAMINB12, FOLATE, FERRITIN, TIBC, IRON, RETICCTPCT in the last 72 hours. Sepsis Labs:  Recent Labs Lab 11/04/15 2330 11/06/15 0524 11/08/15 0507  PROCALCITON <0.10 <0.10 <0.10    Recent Results (from the past 240 hour(s))  MRSA PCR Screening     Status: None   Collection Time: 11/04/15  9:50 PM  Result Value Ref Range Status   MRSA by PCR NEGATIVE NEGATIVE Final    Comment:        The GeneXpert MRSA Assay (FDA approved for NASAL specimens only), is one component of a comprehensive MRSA colonization surveillance program. It is not intended to diagnose MRSA infection nor to guide or monitor treatment for MRSA infections.          Radiology Studies: No results found.      Scheduled Meds: . amLODipine  10 mg Oral Daily  . antiseptic oral rinse  7 mL Mouth Rinse  q12n4p  . aspirin EC  81 mg Oral Daily  . atorvastatin  40 mg Oral QPM  . chlorhexidine  15 mL Mouth Rinse BID  . cloBAZam  20 mg Oral BID  . cloNIDine  0.2 mg Oral BID  . clopidogrel  75 mg Oral QPM  . enoxaparin (LOVENOX) injection  30 mg Subcutaneous Q24H  . gabapentin  300 mg Oral TID  . isosorbide mononitrate  90 mg Oral Daily  . labetalol  200 mg Oral BID  . lacosamide  200 mg Oral Q12H  . levETIRAcetam  750 mg Oral BID  . pantoprazole  40 mg Oral Daily  . sodium chloride flush  3 mL Intravenous Q12H   Continuous Infusions:   Time spent: > 35 minutes  Velvet Bathe, MD [facility] Pager [phone]   If 7PM-7AM, please contact night-coverage [email or web address] Password [email or web address] 11/08/2015, 3:52 PM

## 2015-11-08 NOTE — Care Management Important Message (Signed)
Important Message  Patient Details  Name: Paul Mullins MRN: [id number] Date of Birth: [birth year]   Medicare Important Message Given:  Yes    Apolonio Schneiders, RN 11/08/2015, 3:20 PM

## 2015-11-09 ENCOUNTER — Encounter (HOSPITAL_COMMUNITY): Payer: Self-pay | Admitting: *Deleted

## 2015-11-09 LAB — BASIC METABOLIC PANEL
ANION GAP: 8 (ref 5–15)
BUN: 45 mg/dL — ABNORMAL HIGH (ref 6–20)
CALCIUM: 8.5 mg/dL — AB (ref 8.9–10.3)
CO2: 26 mmol/L (ref 22–32)
CREATININE: 3 mg/dL — AB (ref 0.61–1.24)
Chloride: 104 mmol/L (ref 101–111)
GFR, EST AFRICAN AMERICAN: 23 mL/min — AB (ref >60)
GFR, EST NON AFRICAN AMERICAN: 20 mL/min — AB (ref >60)
Glucose, Bld: 110 mg/dL — ABNORMAL HIGH (ref 65–99)
Potassium: 4.9 mmol/L (ref 3.5–5.1)
SODIUM: 138 mmol/L (ref 135–145)

## 2015-11-09 MED ORDER — SODIUM CHLORIDE 0.9 % IV SOLN
Freq: Once | INTRAVENOUS | Status: DC
Start: 2015-11-09 — End: 2015-11-11

## 2015-11-09 NOTE — Progress Notes (Signed)
Notified MD of patient not having IV access. This Rn and IV team nurse tried to establish access several times unsuccessfully. MD told this RN to encourage oral intake with patient and try again this afternoon to establish an IV. Charge RN for this unit updated about condition. Will continue to monitor.

## 2015-11-09 NOTE — Progress Notes (Signed)
PT Cancellation Note  Patient Details Name: Paul Mullins MRN: [id number] DOB: November 28, 1950   Cancelled Treatment:    Reason Eval/Treat Not Completed: Attempted PT tx session. Pt asleep with tray in front of him. Awakened pt for participation with PT-he declined at this time-"ok...after I finish eating". Will check back if schedule allows. Thanks.    Weston Anna, MPT Pager: [phone]

## 2015-11-09 NOTE — Progress Notes (Signed)
PROGRESS NOTE    Paul Mullins  [name] DOB: Sep 30, 1950 DOA: 11/04/2015 PCP: Alfonso Patten, MD   Brief Narrative:  Paul Mullins is a 65 y.o. male with a past medical history significant for HTN, PVD s/p RIGHT fem-pop bypass in 2014, CKD baseline Cr 1.8, chronic diastolic CHF, and recently diagnosed brain neoplasm c/b seizures who presents with 20 lbs weight gain and leg swelling since his last hospitalization.  Since his discharge to [facility] 2 weeks ago, family had noticed gradually worsening leg edema. The swelling is worse on the RIGHT, also associated with hand puffiness, and today and yesterday was noticeably worse so family were concerned that it might prevent him from having surgery and brought him to the ER  Assessment & Plan:   Principal Problem:   Acute on chronic diastolic CHF (congestive heart failure), NYHA class 1 ([place]) - echocardiogram reporting EF of 45 to 50 percent - Had 2 days of IV lasix. Transitioned to oral lasix but was held due to ARF.   ARF - due to lasix administration most likely - Will hold lasix and administer fluids judiciously for another day. Serum creatinine improved.  Hyperkalemia - resolved after kayexalate administration - d/c potassium replacement - Has resolved with the above interventions.  Active Problems:   Essential hypertension - Patient currently on amlodipine    Seizure disorder ([place]) - Stable continue current regimen    Neoplasm - Patient to continue prior to admission plan developed by his oncologist/physicians    Normocytic anemia - Stable no active bleeding    Acute-on-chronic kidney injury ([place]) - Stable at 2.0  DVT prophylaxis: Lovenox Code Status: Full Family Communication: Discussed with patient Disposition Plan: Pending improvement in kidney function   Consultants:   None   Procedures: Echocardiogram pending   Antimicrobials: None   Subjective: Pt has no new complaints. No acute  issues reported overnight  Objective: Vitals:   11/08/15 1526 11/08/15 2058 11/09/15 0510 11/09/15 0927  BP: 116/66 123/67 (!) 145/92 140/67  Pulse: 74 76 76 74  Resp: 18 18 18    Temp: 98.3 F (36.8 C) 98.3 F (36.8 C) 97.6 F (36.4 C)   TempSrc: Oral Oral Oral   SpO2: 97% 100% 96%   Weight:   88.7 kg (195 lb 8.8 oz)   Height:        Intake/Output Summary (Last 24 hours) at 11/09/15 1140 Last data filed at 11/09/15 0943  Gross per 24 hour  Intake             2200 ml  Output             3010 ml  Net             -810 ml   Filed Weights   11/07/15 0511 11/08/15 0545 11/09/15 0510  Weight: 87.6 kg (193 lb 2 oz) 87.5 kg (192 lb 14.4 oz) 88.7 kg (195 lb 8.8 oz)    Examination:  General exam: Appears calm and comfortable , alert and awake Respiratory system: Clear to auscultation. Respiratory effort normal. Cardiovascular system: S1 & S2 heard, RRR. No JVD, murmurs, rubs, gallops or clicks. Positive edema. Gastrointestinal system: Abdomen is nondistended, soft and nontender. No organomegaly or masses felt. Normal bowel sounds heard. Central nervous system: Alert and oriented. No focal neurological deficits. Extremities: Symmetric 5 x 5 power. Skin: No rashes, lesions or ulcers Psychiatry: Judgement and insight appear normal. Mood & affect appropriate.   Data Reviewed: I have personally reviewed following labs  and imaging studies  CBC:  Recent Labs Lab 11/04/15 1519 11/04/15 2330 11/05/15 0410  WBC 5.7  --  5.3  HGB 9.2*  --  9.8*  HCT 29.5* 28.1* 31.0*  MCV 98.0  --  97.5  PLT 212  --  123XX123   Basic Metabolic Panel:  Recent Labs Lab 11/04/15 2330 11/05/15 0410 11/06/15 0524 11/07/15 0531 11/07/15 1153 11/08/15 0507 11/09/15 0021  NA  --  141 138 138  --  138 138  K  --  4.0 4.9 5.5* 5.7* 4.9 4.9  CL  --  105 103 103  --  101 104  CO2  --  27 26 26   --  27 26  GLUCOSE  --  106* 100* 99  --  114* 110*  BUN  --  27* 30* 32*  --  43* 45*  CREATININE  --   2.18* 2.45* 2.96*  --  3.16* 3.00*  CALCIUM  --  8.8* 8.5* 8.8*  --  8.8* 8.5*  MG 1.4*  --   --   --   --   --   --    GFR: Estimated Creatinine Clearance: 24.3 mL/min (by C-G formula based on SCr of 3 mg/dL). Liver Function Tests:  Recent Labs Lab 11/04/15 1542  AST 30  ALT 39  ALKPHOS 85  BILITOT 0.2*  PROT 7.6  ALBUMIN 3.3*   No results for input(s): LIPASE, AMYLASE in the last 168 hours. No results for input(s): AMMONIA in the last 168 hours. Coagulation Profile: No results for input(s): INR, PROTIME in the last 168 hours. Cardiac Enzymes:  Recent Labs Lab 11/04/15 2330 11/05/15 0410  TROPONINI 0.03* 0.03*   BNP (last 3 results) No results for input(s): PROBNP in the last 8760 hours. HbA1C: No results for input(s): HGBA1C in the last 72 hours. CBG: No results for input(s): GLUCAP in the last 168 hours. Lipid Profile: No results for input(s): CHOL, HDL, LDLCALC, TRIG, CHOLHDL, LDLDIRECT in the last 72 hours. Thyroid Function Tests: No results for input(s): TSH, T4TOTAL, FREET4, T3FREE, THYROIDAB in the last 72 hours. Anemia Panel: No results for input(s): VITAMINB12, FOLATE, FERRITIN, TIBC, IRON, RETICCTPCT in the last 72 hours. Sepsis Labs:  Recent Labs Lab 11/04/15 2330 11/06/15 0524 11/08/15 0507  PROCALCITON <0.10 <0.10 <0.10    Recent Results (from the past 240 hour(s))  MRSA PCR Screening     Status: None   Collection Time: 11/04/15  9:50 PM  Result Value Ref Range Status   MRSA by PCR NEGATIVE NEGATIVE Final    Comment:        The GeneXpert MRSA Assay (FDA approved for NASAL specimens only), is one component of a comprehensive MRSA colonization surveillance program. It is not intended to diagnose MRSA infection nor to guide or monitor treatment for MRSA infections.          Radiology Studies: No results found.      Scheduled Meds: . sodium chloride   Intravenous Once  . amLODipine  10 mg Oral Daily  . antiseptic oral rinse   7 mL Mouth Rinse q12n4p  . aspirin EC  81 mg Oral Daily  . atorvastatin  40 mg Oral QPM  . chlorhexidine  15 mL Mouth Rinse BID  . cloBAZam  20 mg Oral BID  . cloNIDine  0.2 mg Oral BID  . clopidogrel  75 mg Oral QPM  . enoxaparin (LOVENOX) injection  30 mg Subcutaneous Q24H  . gabapentin  300 mg Oral  TID  . isosorbide mononitrate  90 mg Oral Daily  . labetalol  200 mg Oral BID  . lacosamide  200 mg Oral Q12H  . levETIRAcetam  750 mg Oral BID  . pantoprazole  40 mg Oral Daily  . sodium chloride flush  3 mL Intravenous Q12H   Continuous Infusions:   Time spent: > 35 minutes  Velvet Bathe, MD [facility] Pager [phone]   If 7PM-7AM, please contact night-coverage [email or web address] Password [email or web address] 11/09/2015, 11:40 AM

## 2015-11-09 NOTE — Progress Notes (Signed)
PT Cancellation Note  Patient Details Name: Paul Mullins MRN: [id number] DOB: Nov 27, 1950   Cancelled Treatment:    Reason Eval/Treat Not Completed: 2nd attempt for PT session-pt asleep again. Awakened pt to participate-he declined. Will check back another day.    Weston Anna, MPT Pager: [phone]

## 2015-11-10 LAB — BASIC METABOLIC PANEL
ANION GAP: 7 (ref 5–15)
BUN: 44 mg/dL — ABNORMAL HIGH (ref 6–20)
CHLORIDE: 104 mmol/L (ref 101–111)
CO2: 25 mmol/L (ref 22–32)
CREATININE: 2.69 mg/dL — AB (ref 0.61–1.24)
Calcium: 8.7 mg/dL — ABNORMAL LOW (ref 8.9–10.3)
GFR calc non Af Amer: 23 mL/min — ABNORMAL LOW (ref >60)
GFR, EST AFRICAN AMERICAN: 27 mL/min — AB (ref >60)
Glucose, Bld: 131 mg/dL — ABNORMAL HIGH (ref 65–99)
Potassium: 5 mmol/L (ref 3.5–5.1)
SODIUM: 136 mmol/L (ref 135–145)

## 2015-11-10 NOTE — Progress Notes (Signed)
Patient had planned discharge to SNF. PT recommends discharge to SNF. Due to circumstances discharge was changed to Home with Home Health. MSW communicated to RN that CM would set up [facility] and transportation home. RN attempted to call CM twice around 4:15 and 4:30 pm leaving message with no response. There is no note in system verifying that [facility] and transportation have been arranged. When RN asks patient his address he cannot state his address. He states he has lived at [place] for 2 years then states he has lived there since 08/07/2022 when his wife died. Unsure if patient has housing at this point.  Patient is always eager to walk and asks RN every time she is in the room to take him for a walk and talks about how much better he was feeling at [facility] with physical therapy.  RN called to express concerns to MD, patient also requesting to stay another night. Discharge canceled for today. Will follow up with MSW and CM tomorrow.  Barbee Shropshire. Brigitte Pulse, RN

## 2015-11-10 NOTE — Discharge Summary (Signed)
Physician Discharge Summary  Paul Mullins [name] DOB: 03-16-1951 DOA: 11/04/2015  PCP: Alfonso Patten, MD  Admit date: 11/04/2015 Discharge date: 11/10/2015  Time spent: > 35 minutes  Recommendations for Outpatient Follow-up:  1. Monitor Serum creatinine   Discharge Diagnoses:  Principal Problem:   Acute on chronic diastolic CHF (congestive heart failure), NYHA class 1 (HCC) Active Problems:   Essential hypertension   Seizure disorder (HCC)   Neoplasm   Normocytic anemia   Acute-on-chronic kidney injury ([place])   AKI (acute kidney injury) ([place])   Bilateral leg edema   Brain mass   CRI (chronic renal insufficiency)   Leg swelling   Discharge Condition: stable  Diet recommendation:  Heart healthy  Filed Weights   11/08/15 0545 11/09/15 0510 11/10/15 0540  Weight: 87.5 kg (192 lb 14.4 oz) 88.7 kg (195 lb 8.8 oz) 86.8 kg (191 lb 5.8 oz)    History of present illness:  Paul Mullins is a 65 y.o. male with a past medical history significant for HTN, PVD s/p RIGHT fem-pop bypass in 2014, CKD baseline Cr 1.8, chronic diastolic CHF, and recently diagnosed brain neoplasm c/b seizures who presents with 20 lbs weight gain and leg swelling since his last hospitalization.  Since his discharge to [facility] 2 weeks ago, family had noticed gradually worsening leg edema. The swelling is worse on the RIGHT, also associated with hand puffiness, and today and yesterday was noticeably worse so family were concerned that it might prevent him from having surgery and brought him to the ER   Hospital Course:  Principal Problem:   Acute on chronic diastolic CHF (congestive heart failure), NYHA class 1 ([place]) - echocardiogram reporting EF of 45 to 50 percent - Pt had elevated Serum creatinine with over diuresis. Will place back on home medication dose (prior to admission).   ARF - Was overdiuresed with elevation in Serum creatinine which has trended down after holding IV  lasix. Lasix should be given judiciously with this patient.  Hyperkalemia - resolved after kayexalate administration - d/c potassium replacement - Has resolved with the above interventions.  Active Problems:   Essential hypertension - Patient currently on amlodipine    Seizure disorder ([place]) - Stable continue current regimen    Neoplasm - Patient to continue prior to admission plan developed by his oncologist/physicians    Normocytic anemia - Stable no active bleeding    Acute-on-chronic kidney injury ([place]) - Stable at 2.0  Procedures: None  Consultations:  None  Discharge Exam: Vitals:   11/09/15 2128 11/10/15 0540  BP: 129/66 (!) 158/58  Pulse: 72 71  Resp: 18 18  Temp: 97.7 F (36.5 C) 97.6 F (36.4 C)    General: Pt in nad, alert and awake Cardiovascular: rrr, no rubs Respiratory: no increased wob, no wheezes  Discharge Instructions   Discharge Instructions    Call MD for:  difficulty breathing, headache or visual disturbances    Complete by:  As directed   Call MD for:  severe uncontrolled pain    Complete by:  As directed   Call MD for:  temperature >100.4    Complete by:  As directed   Diet - low sodium heart healthy    Complete by:  As directed   Increase activity slowly    Complete by:  As directed     Current Discharge Medication List    CONTINUE these medications which have NOT CHANGED   Details  amLODipine (NORVASC) 10 MG tablet Take 10 mg by  mouth daily.    aspirin EC 81 MG tablet Take 81 mg by mouth daily.    atorvastatin (LIPITOR) 40 MG tablet Take 40 mg by mouth daily.    cloBAZam (ONFI) 20 MG tablet Take 1 tablet (20 mg total) by mouth 2 (two) times daily.    cloNIDine (CATAPRES) 0.2 MG tablet Take 0.2 mg by mouth 2 (two) times daily.     clopidogrel (PLAVIX) 75 MG tablet Take 75 mg by mouth every evening.     furosemide (LASIX) 20 MG tablet Take 1 tablet (20 mg total) by mouth daily.    gabapentin (NEURONTIN) 300 MG  capsule Take 300 mg by mouth 3 (three) times daily.    isosorbide mononitrate (IMDUR) 30 MG 24 hr tablet Take 90 mg by mouth daily.    labetalol (NORMODYNE) 200 MG tablet Take 200 mg by mouth 2 (two) times daily.    lacosamide (VIMPAT) 200 MG TABS tablet Take 1 tablet (200 mg total) by mouth every 12 (twelve) hours.    levETIRAcetam (KEPPRA) 750 MG tablet Take 1 tablet (750 mg total) by mouth 2 (two) times daily.    nitroGLYCERIN (NITROSTAT) 0.4 MG SL tablet Place 0.4 mg under the tongue every 5 (five) minutes as needed for chest pain.    pantoprazole (PROTONIX) 40 MG tablet Take 40 mg by mouth daily.    polyethylene glycol (MIRALAX / GLYCOLAX) packet Take 17 g by mouth daily as needed. Qty: 14 each, Refills: 0       Allergies  Allergen Reactions  . Ace Inhibitors Other (See Comments)    Impaired  Kidney Function      The results of significant diagnostics from this hospitalization (including imaging, microbiology, ancillary and laboratory) are listed below for reference.    Significant Diagnostic Studies: Ct Head W & Wo Contrast  Result Date: 11/02/2015 CLINICAL DATA:  65 year old male study for stereotactic surgical planning. 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 Subsequent encounter. EXAM: CT HEAD WITHOUT AND WITH CONTRAST TECHNIQUE: Contiguous axial images were obtained from the base of the skull through the vertex without and with intravenous contrast CONTRAST:  1 ISOVUE-300 IOPAMIDOL (ISOVUE-300) INJECTION 61% COMPARISON:  Brain MRI without and with contrast 10/19/2015 and earlier. FINDINGS: The posterior left parietal lobe area of confluent abnormal T2 signal is re- demonstrated on series 21, image 98, with the abnormal signal tracking to the posterior body of the left lateral ventricle. No progression is evident. The punctate/petechial enhancement is not evident by CT. No suspicious diffusion  changes noted recently. Stable minimal to mild regional mass effect. No abnormal intracranial enhancement by CT. Gray-white matter differentiation elsewhere within normal limits. Extensive Calcified atherosclerosis at the skull base. No acute intracranial hemorrhage identified. Major intracranial vascular structures are enhancing. Mild to moderate ethmoid and frontal sinus mucosal thickening is stable. Mild inferior right mastoid effusion again noted. No acute osseous abnormality identified. No acute orbit or scalp soft tissue findings. IMPRESSION: 1. Study for stereotactic surgical planning. 2. Hypodensity in the posterior left parietal lobe corresponding to the area of MRI signal abnormality with gyral enlargement. The minimal enhancement on MRI is not evident by CT. 3. No new intracranial abnormality. Electronically Signed   By: Genevie Ann M.D.   On: 11/02/2015 16:30   Mr Jeri Cos [id number] Contrast  Result Date: 10/19/2015 CLINICAL DATA:  Continued surveillance LEFT parietal lesion. Surgical planning. EXAM: MRI HEAD WITHOUT AND WITH CONTRAST TECHNIQUE: Multiplanar, multiecho  pulse sequences of the brain and surrounding structures were obtained without and with intravenous contrast. CONTRAST:  41mL MULTIHANCE GADOBENATE DIMEGLUMINE 529 MG/ML IV SOLN COMPARISON:  Multiple priors, including previous MRI examinations from 2016. FINDINGS: Moderate-sized area of T2 and FLAIR hyperintensity, involving the LEFT parietal cortex and subcortical white matter, straddling the PCA and MCA vascular territories, no significant restriction, developing vasogenic edema, gyriform swelling, petechial hemorrhage, with a paucity of enhancement. Primary brain tumor is favored. The prolonged time course and progressive worsening of the imaging findings are not consistent with vascular disease or metastasis. Abnormal T2 signal cross-section measures 31 x 48 mm on image 17 series 5. No significant change most recent MR of 10/11/2015. 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 Electronically Signed   By: Staci Righter M.D.   On: 10/19/2015 20:14   Mr Jeri Cos [id number] Contrast  Result Date: 10/11/2015 CLINICAL DATA:  Seizures. EXAM: MRI HEAD WITHOUT AND WITH CONTRAST TECHNIQUE: Multiplanar, multiecho pulse sequences of the brain and surrounding structures were obtained without and with intravenous contrast. CONTRAST:  15mL MULTIHANCE GADOBENATE DIMEGLUMINE 529 MG/ML IV SOLN COMPARISON:  Head CT 10/07/2015 and MRI 08/19/2015 FINDINGS: Confluent region of T2 hyperintensity/edema in the left parietal lobe and posterior cingulate gyrus has mildly progressed from the prior MRI, again with mild mass effect with sulcal effacement. Diffusion is facilitated, and this extends over approximately 7 mm. There is new left parietal cortical trace diffusion signal abnormality at the margins of this region with relatively normal ADC. Small foci of susceptibility artifact within this region are unchanged and compatible with remote hemorrhages. There is some mildly prominent leptomeningeal vascular enhancement in this region as well as some non masslike petechial parenchymal enhancement. Elsewhere, there is no evidence of acute infarct. No extra-axial fluid collection or midline shift. Slight mass effect on the left lateral ventricle from the left parietal abnormality, with the ventricles otherwise normal in size. Scattered, small foci of T2 hyperintensity elsewhere in the cerebral white matter bilaterally are unchanged and nonspecific but compatible with mild chronic small vessel ischemic disease. No abnormal enhancement is identified elsewhere. Orbits are unremarkable. Mild mucosal thickening is noted in the paranasal sinuses, and there is a small chronic right mastoid effusion. Major intracranial vascular flow voids are unchanged. IMPRESSION: 1. Mild further enlargement of the region of left parietal signal abnormality with small amount of  petechial enhancement. 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 Subacute infarct would be expected to have shown noticeable improvement in this timeframe, even if there had been some additional ischemia in the interim. Cerebritis could also give this appearance though does not fit the clinical presentation. 2. Mild left parietal cortical diffusion abnormality likely reflects recent seizure activity. Electronically Signed   By: Logan Bores M.D.   On: 10/11/2015 16:35   Nm Pulmonary Perf And Vent  Result Date: 11/05/2015 CLINICAL DATA:  Worsening lower extremity swelling for 2 weeks. EXAM: NUCLEAR MEDICINE VENTILATION - PERFUSION LUNG SCAN TECHNIQUE: Ventilation images were obtained in multiple projections using inhaled aerosol Tc-35m DTPA. Perfusion images were obtained in multiple projections after intravenous injection of Tc-72m MAA. RADIOPHARMACEUTICALS:  30.0 mCi Technetium-82m DTPA aerosol inhalation and 4.1 mCi Technetium-83m MAA IV COMPARISON:  Chest radiograph on 11/04/2015 FINDINGS: Ventilation: No focal ventilation defect. Perfusion: No wedge shaped peripheral perfusion defects to suggest acute pulmonary embolism. IMPRESSION: No  evidence of pulmonary embolism. Electronically Signed   By: Earle Gell M.D.   On: 11/05/2015 13:07   Dg Chest Port 1 View  Result Date: 11/04/2015 CLINICAL DATA:  Bilateral leg swelling. History of congestive heart failure. Hypertension. EXAM: PORTABLE CHEST 1 VIEW COMPARISON:  Chest radiograph 10/11/2015 FINDINGS: There is shallow lung inflation. Cardiomediastinal silhouette is enlarged. There are bilateral perihilar opacities with peribronchial cuffing. No sizable pleural effusion on this semi upright radiograph. No pneumothorax. IMPRESSION: 1. Moderate pulmonary edema. 2. Enlarged cardiomediastinal silhouette, which may indicate pericardial effusion versus  cardiomegaly alone. The appearance may also be exaggerated by a combination of AP technique and shallow lung inflation. Electronically Signed   By: Ulyses Jarred M.D.   On: 11/04/2015 16:14   Dg Chest Portable 1 View  Result Date: 10/11/2015 CLINICAL DATA:  Seizure like activity, mid sternal chest pain, recent slightly productive cough for 1 week, history hypertension, CHF, coronary artery disease, VSD EXAM: PORTABLE CHEST 1 VIEW COMPARISON:  Portable exam 0033 hours compared to 08/19/2015 FINDINGS: Normal heart size, mediastinal contours, and pulmonary vascularity. Lungs clear. No pleural effusion or pneumothorax. Bones unremarkable. IMPRESSION: No acute abnormalities. Electronically Signed   By: Lavonia Dana M.D.   On: 10/11/2015 12:57    Microbiology: Recent Results (from the past 240 hour(s))  MRSA PCR Screening     Status: None   Collection Time: 11/04/15  9:50 PM  Result Value Ref Range Status   MRSA by PCR NEGATIVE NEGATIVE Final    Comment:        The GeneXpert MRSA Assay (FDA approved for NASAL specimens only), is one component of a comprehensive MRSA colonization surveillance program. It is not intended to diagnose MRSA infection nor to guide or monitor treatment for MRSA infections.      Labs: Basic Metabolic Panel:  Recent Labs Lab 11/04/15 2330  11/06/15 0524 11/07/15 0531 11/07/15 1153 11/08/15 0507 11/09/15 0021 11/10/15 0950  NA  --   < > 138 138  --  138 138 136  K  --   < > 4.9 5.5* 5.7* 4.9 4.9 5.0  CL  --   < > 103 103  --  101 104 104  CO2  --   < > 26 26  --  27 26 25   GLUCOSE  --   < > 100* 99  --  114* 110* 131*  BUN  --   < > 30* 32*  --  43* 45* 44*  CREATININE  --   < > 2.45* 2.96*  --  3.16* 3.00* 2.69*  CALCIUM  --   < > 8.5* 8.8*  --  8.8* 8.5* 8.7*  MG 1.4*  --   --   --   --   --   --   --   < > = values in this interval not displayed. Liver Function Tests:  Recent Labs Lab 11/04/15 1542  AST 30  ALT 39  ALKPHOS 85  BILITOT 0.2*   PROT 7.6  ALBUMIN 3.3*   No results for input(s): LIPASE, AMYLASE in the last 168 hours. No results for input(s): AMMONIA in the last 168 hours. CBC:  Recent Labs Lab 11/04/15 1519 11/04/15 2330 11/05/15 0410  WBC 5.7  --  5.3  HGB 9.2*  --  9.8*  HCT 29.5* 28.1* 31.0*  MCV 98.0  --  97.5  PLT 212  --  237   Cardiac Enzymes:  Recent Labs Lab 11/04/15 2330 11/05/15 0410  TROPONINI 0.03* 0.03*   BNP: BNP (last 3 results)  Recent Labs  11/04/15 1540  BNP 86.3    ProBNP (last 3 results) No results for input(s): PROBNP in the last 8760 hours.  CBG: No results for input(s): GLUCAP in the last 168 hours.  Signed:  Velvet Bathe MD.  [facility] 11/10/2015, 12:06 PM

## 2015-11-10 NOTE — Progress Notes (Signed)
Physical Therapy Treatment Patient Details Name: Paul Mullins MRN: [id number] DOB: [birth year] Today's Date: 11/10/2015    History of Present Illness 65 yo male admitted with CHF. hx of neck sg, R fem-pop bypass, PVD, CHF, Sz. D/c 7/4 to SNF.     PT Comments    Progressing with mobility. Recommend return to SNF to continue rehab.   Follow Up Recommendations  SNF     Equipment Recommendations  None recommended by PT    Recommendations for Other Services       Precautions / Restrictions Precautions Precautions: Fall Precaution Comments: seizure Restrictions Weight Bearing Restrictions: No    Mobility  Bed Mobility Overal bed mobility: Needs Assistance Bed Mobility: Supine to Sit     Supine to sit: Min assist;HOB elevated     General bed mobility comments: Intermittent assist for R LE. Increased time.   Transfers Overall transfer level: Needs assistance Equipment used: Rolling walker (2 wheeled) Transfers: Sit to/from Stand Sit to Stand: Min assist         General transfer comment: Assist to rise, stabilize, control descent. VCs safety, hand placement  Ambulation/Gait Ambulation/Gait assistance: Min assist Ambulation Distance (Feet): 175 Feet Assistive device: Rolling walker (2 wheeled) Gait Pattern/deviations: Step-to pattern;Decreased step length - right;Decreased stride length     General Gait Details: Intermittent lagging of R LE. VCs safety, pacing. slow gait speed.   Stairs            Wheelchair Mobility    Modified Rankin (Stroke Patients Only)       Balance                                    Cognition Arousal/Alertness: Awake/alert Behavior During Therapy: WFL for tasks assessed/performed Overall Cognitive Status: No family/caregiver present to determine baseline cognitive functioning Area of Impairment: Safety/judgement         Safety/Judgement: Decreased awareness of safety;Decreased awareness of deficits           Exercises General Exercises - Lower Extremity Ankle Circles/Pumps: AROM;20 reps;Seated Long Arc Quad: AROM;20 reps;Seated Hip ABduction/ADduction: AROM;Both;20 reps;Supine;Seated Straight Leg Raises: AROM;AAROM;Both;20 reps;Supine Hip Flexion/Marching: AROM;Both;20 reps;Supine;Seated    General Comments        Pertinent Vitals/Pain Pain Assessment: No/denies pain    Home Living                      Prior Function            PT Goals (current goals can now be found in the care plan section) Progress towards PT goals: Progressing toward goals    Frequency  Min 2X/week    PT Plan Current plan remains appropriate    Co-evaluation             End of Session Equipment Utilized During Treatment: Gait belt Activity Tolerance: Patient tolerated treatment well Patient left: in chair;with call bell/phone within reach;with chair alarm set     Time: 1120-1140 PT Time Calculation (min) (ACUTE ONLY): 20 min  Charges:  $Gait Training: 8-22 mins                    G Codes:      Weston Anna, MPT Pager: [phone]

## 2015-11-11 ENCOUNTER — Telehealth: Payer: Self-pay | Admitting: *Deleted

## 2015-11-11 LAB — CREATININE, SERUM
CREATININE: 2.5 mg/dL — AB (ref 0.61–1.24)
GFR, EST AFRICAN AMERICAN: 29 mL/min — AB (ref >60)
GFR, EST NON AFRICAN AMERICAN: 25 mL/min — AB (ref >60)

## 2015-11-11 MED ORDER — FUROSEMIDE 20 MG PO TABS: 20.0000 mg | ORAL_TABLET | Freq: Every day | ORAL | 0 refills | Status: AC

## 2015-11-11 MED ORDER — PANTOPRAZOLE SODIUM 40 MG PO TBEC: 40.0000 mg | DELAYED_RELEASE_TABLET | Freq: Every day | ORAL | 0 refills | Status: AC

## 2015-11-11 MED ORDER — LABETALOL HCL 200 MG PO TABS: 200.0000 mg | ORAL_TABLET | Freq: Two times a day (BID) | ORAL | 0 refills | Status: AC

## 2015-11-11 MED ORDER — ASPIRIN EC 81 MG PO TBEC: 81.0000 mg | DELAYED_RELEASE_TABLET | Freq: Every day | ORAL | 0 refills | Status: AC

## 2015-11-11 MED ORDER — CLONIDINE HCL 0.2 MG PO TABS: 0.2000 mg | ORAL_TABLET | Freq: Two times a day (BID) | ORAL | 0 refills | Status: AC

## 2015-11-11 MED ORDER — GABAPENTIN 300 MG PO CAPS: 300.0000 mg | ORAL_CAPSULE | Freq: Three times a day (TID) | ORAL | 0 refills | Status: AC

## 2015-11-11 MED ORDER — ISOSORBIDE MONONITRATE ER 30 MG PO TB24: 90.0000 mg | ORAL_TABLET | Freq: Every day | ORAL | 0 refills | Status: AC

## 2015-11-11 MED ORDER — CLOBAZAM 20 MG PO TABS: 20.0000 mg | ORAL_TABLET | Freq: Two times a day (BID) | ORAL | 0 refills | Status: AC

## 2015-11-11 MED ORDER — AMLODIPINE BESYLATE 10 MG PO TABS: 10.0000 mg | ORAL_TABLET | Freq: Every day | ORAL | 0 refills | Status: AC

## 2015-11-11 MED ORDER — POLYETHYLENE GLYCOL 3350 17 G PO PACK: 17.0000 g | PACK | Freq: Every day | ORAL | 0 refills | Status: AC | PRN

## 2015-11-11 MED ORDER — CLOPIDOGREL BISULFATE 75 MG PO TABS: 75.0000 mg | ORAL_TABLET | Freq: Every evening | ORAL | 0 refills | Status: AC

## 2015-11-11 MED ORDER — LACOSAMIDE 200 MG PO TABS: 200.0000 mg | ORAL_TABLET | Freq: Two times a day (BID) | ORAL | 0 refills | Status: DC

## 2015-11-11 MED ORDER — ATORVASTATIN CALCIUM 40 MG PO TABS: 40.0000 mg | ORAL_TABLET | Freq: Every day | ORAL | 0 refills | Status: AC

## 2015-11-11 MED ORDER — LEVETIRACETAM 750 MG PO TABS: 750.0000 mg | ORAL_TABLET | Freq: Two times a day (BID) | ORAL | 0 refills | Status: AC

## 2015-11-11 MED ORDER — NITROGLYCERIN 0.4 MG SL SUBL: 0.4000 mg | SUBLINGUAL_TABLET | SUBLINGUAL | 0 refills | Status: AC | PRN

## 2015-11-11 NOTE — Care Management (Signed)
Kyung Rudd called to say pt's sister could not get him out of the car. Pt walked 175 ft with PT. Explained to Kyung Rudd that pt could have gone to SNF with the pt paying out of pocket.  Also that [facility] is in place to come to the home. Kyung Rudd states that he will ask his brother to help pt out of the car. Kyung Rudd is aware to call EMS or FD for assistance.

## 2015-11-11 NOTE — Progress Notes (Signed)
CSW spoke with pt's nephew, Kyung Rudd, and agreed that pt would go home today since his insurance will not pay for SNF placement. Pt's nephew Kyung Rudd has already applied for Medicaid and will begin looking for a long-term placement while he is at home. Please consult CM for any DC needs.   Kingsley Spittle, [facility] Clinical Social Worker [phone]

## 2015-11-11 NOTE — Progress Notes (Signed)
Pt discharging home with nephew Mammie Lorenzo at [facility]. [place], [place] [address]. No preference for HH. Referral given to [facility].

## 2015-11-11 NOTE — Progress Notes (Signed)
This AM was asked to come talk with pt's sister concerning placement to SNF/SLF.  This CM Spoke with sister Tim Lair (2nd [place], Kyung Rudd is 1st HCPOA) concerning pt's discharge. Pt was asked, if he wanted to go to a SNF/Rehab or ALF or home. Pt states he would like  to go back to [facility] or a place like it. Pt and sister agreed with later going to a SNF or ALF. Pt's sister understands that [facility] will come into the home (RN,NA,PT,SW).

## 2015-11-11 NOTE — Progress Notes (Signed)
CSW received call from RN, Paul Mullins that patient's family is at bedside & requesting that CSW contact [facility] to make referral. CSW called & spoke with Paul Mullins at [facility] who confirmed that they would be able to review information & admit patient from home if he is accepted. CSW sent requested information to [facility].   No further CSW needs identified - CSW signing off.   Raynaldo Opitz, [facility] Clinical Social Worker cell #: [phone]

## 2015-11-11 NOTE — Progress Notes (Signed)
[facility]  Patient discharged yesterday by Dr Wendee Beavers. Please see his d/c summary. Patient will go home with home health today. He is stable for discharge. Needs to see his PCP in 1wk.   Debbe Odea, MD

## 2015-11-11 NOTE — Discharge Instructions (Signed)

## 2015-11-13 ENCOUNTER — Observation Stay (HOSPITAL_COMMUNITY)
Admission: EM | Admit: 2015-11-13 | Discharge: 2015-11-17 | Disposition: A | Discharge status: Home / Self Care | Payer: Medicare HMO | Attending: Internal Medicine | Admitting: Internal Medicine

## 2015-11-13 ENCOUNTER — Emergency Department (HOSPITAL_COMMUNITY): Payer: Medicare HMO

## 2015-11-13 ENCOUNTER — Encounter (HOSPITAL_COMMUNITY): Payer: Self-pay | Admitting: Emergency Medicine

## 2015-11-13 DIAGNOSIS — Z7902 Long term (current) use of antithrombotics/antiplatelets: Secondary | ICD-10-CM | POA: Insufficient documentation

## 2015-11-13 DIAGNOSIS — K219 Gastro-esophageal reflux disease without esophagitis: Secondary | ICD-10-CM | POA: Insufficient documentation

## 2015-11-13 DIAGNOSIS — E875 Hyperkalemia: Secondary | ICD-10-CM

## 2015-11-13 DIAGNOSIS — Y92009 Unspecified place in unspecified non-institutional (private) residence as the place of occurrence of the external cause: Secondary | ICD-10-CM

## 2015-11-13 DIAGNOSIS — R269 Unspecified abnormalities of gait and mobility: Secondary | ICD-10-CM | POA: Insufficient documentation

## 2015-11-13 DIAGNOSIS — Z79899 Other long term (current) drug therapy: Secondary | ICD-10-CM | POA: Diagnosis not present

## 2015-11-13 DIAGNOSIS — G40909 Epilepsy, unspecified, not intractable, without status epilepticus: Secondary | ICD-10-CM | POA: Diagnosis not present

## 2015-11-13 DIAGNOSIS — Z23 Encounter for immunization: Secondary | ICD-10-CM | POA: Diagnosis not present

## 2015-11-13 DIAGNOSIS — I130 Hypertensive heart and chronic kidney disease with heart failure and stage 1 through stage 4 chronic kidney disease, or unspecified chronic kidney disease: Secondary | ICD-10-CM | POA: Diagnosis not present

## 2015-11-13 DIAGNOSIS — R600 Localized edema: Secondary | ICD-10-CM | POA: Insufficient documentation

## 2015-11-13 DIAGNOSIS — G939 Disorder of brain, unspecified: Secondary | ICD-10-CM | POA: Insufficient documentation

## 2015-11-13 DIAGNOSIS — N179 Acute kidney failure, unspecified: Secondary | ICD-10-CM | POA: Diagnosis not present

## 2015-11-13 DIAGNOSIS — N184 Chronic kidney disease, stage 4 (severe): Secondary | ICD-10-CM | POA: Insufficient documentation

## 2015-11-13 DIAGNOSIS — R262 Difficulty in walking, not elsewhere classified: Secondary | ICD-10-CM | POA: Diagnosis not present

## 2015-11-13 DIAGNOSIS — I5032 Chronic diastolic (congestive) heart failure: Secondary | ICD-10-CM | POA: Diagnosis present

## 2015-11-13 DIAGNOSIS — Z87891 Personal history of nicotine dependence: Secondary | ICD-10-CM | POA: Diagnosis not present

## 2015-11-13 DIAGNOSIS — N183 Chronic kidney disease, stage 3 (moderate): Secondary | ICD-10-CM | POA: Diagnosis not present

## 2015-11-13 DIAGNOSIS — W19XXXA Unspecified fall, initial encounter: Secondary | ICD-10-CM | POA: Diagnosis not present

## 2015-11-13 DIAGNOSIS — Z7982 Long term (current) use of aspirin: Secondary | ICD-10-CM | POA: Diagnosis not present

## 2015-11-13 DIAGNOSIS — G9389 Other specified disorders of brain: Secondary | ICD-10-CM | POA: Diagnosis present

## 2015-11-13 DIAGNOSIS — I2510 Atherosclerotic heart disease of native coronary artery without angina pectoris: Secondary | ICD-10-CM | POA: Insufficient documentation

## 2015-11-13 LAB — CBC WITH DIFFERENTIAL/PLATELET
BASOS PCT: 0 %
Basophils Absolute: 0 10*3/uL (ref 0.0–0.1)
EOS PCT: 17 %
Eosinophils Absolute: 1.5 10*3/uL — ABNORMAL HIGH (ref 0.0–0.7)
HCT: 30.7 % — ABNORMAL LOW (ref 39.0–52.0)
Hemoglobin: 9.7 g/dL — ABNORMAL LOW (ref 13.0–17.0)
Lymphocytes Relative: 21 %
Lymphs Abs: 1.9 10*3/uL (ref 0.7–4.0)
MCH: 29.9 pg (ref 26.0–34.0)
MCHC: 31.6 g/dL (ref 30.0–36.0)
MCV: 94.8 fL (ref 78.0–100.0)
MONOS PCT: 6 %
Monocytes Absolute: 0.5 10*3/uL (ref 0.1–1.0)
NEUTROS ABS: 5 10*3/uL (ref 1.7–7.7)
Neutrophils Relative %: 56 %
PLATELETS: 279 10*3/uL (ref 150–400)
RBC: 3.24 MIL/uL — AB (ref 4.22–5.81)
RDW: 15.3 % (ref 11.5–15.5)
WBC: 8.9 10*3/uL (ref 4.0–10.5)

## 2015-11-13 LAB — COMPREHENSIVE METABOLIC PANEL
ALK PHOS: 78 U/L (ref 38–126)
ALT: 24 U/L (ref 17–63)
AST: 29 U/L (ref 15–41)
Albumin: 3.5 g/dL (ref 3.5–5.0)
Anion gap: 9 (ref 5–15)
BUN: 63 mg/dL — ABNORMAL HIGH (ref 6–20)
CALCIUM: 9.1 mg/dL (ref 8.9–10.3)
CO2: 21 mmol/L — ABNORMAL LOW (ref 22–32)
CREATININE: 3.36 mg/dL — AB (ref 0.61–1.24)
Chloride: 105 mmol/L (ref 101–111)
GFR, EST AFRICAN AMERICAN: 20 mL/min — AB (ref >60)
GFR, EST NON AFRICAN AMERICAN: 18 mL/min — AB (ref >60)
Glucose, Bld: 108 mg/dL — ABNORMAL HIGH (ref 65–99)
Potassium: 5.6 mmol/L — ABNORMAL HIGH (ref 3.5–5.1)
Sodium: 135 mmol/L (ref 135–145)
Total Bilirubin: 0.4 mg/dL (ref 0.3–1.2)
Total Protein: 8.4 g/dL — ABNORMAL HIGH (ref 6.5–8.1)

## 2015-11-13 LAB — URINALYSIS, ROUTINE W REFLEX MICROSCOPIC
BILIRUBIN URINE: NEGATIVE
GLUCOSE, UA: NEGATIVE mg/dL
HGB URINE DIPSTICK: NEGATIVE
KETONES UR: NEGATIVE mg/dL
Leukocytes, UA: NEGATIVE
Nitrite: NEGATIVE
PH: 5.5 (ref 5.0–8.0)
Protein, ur: NEGATIVE mg/dL
Specific Gravity, Urine: 1.015 (ref 1.005–1.030)

## 2015-11-13 LAB — GLUCOSE, CAPILLARY: Glucose-Capillary: 129 mg/dL — ABNORMAL HIGH (ref 65–99)

## 2015-11-13 LAB — BRAIN NATRIURETIC PEPTIDE: B NATRIURETIC PEPTIDE 5: 102.6 pg/mL — AB (ref 0.0–100.0)

## 2015-11-13 MED ORDER — SODIUM CHLORIDE 0.9 % IV SOLN: INTRAVENOUS | Status: DC

## 2015-11-13 MED ORDER — LEVETIRACETAM 750 MG PO TABS
750.0000 mg | ORAL_TABLET | Freq: Two times a day (BID) | ORAL | Status: DC
  Administered 2015-11-13 – 2015-11-17 (×8): 750 mg via ORAL
  Filled 2015-11-13 (×10): qty 1

## 2015-11-13 MED ORDER — PANTOPRAZOLE SODIUM 40 MG PO TBEC
40.0000 mg | DELAYED_RELEASE_TABLET | Freq: Every day | ORAL | Status: DC
  Administered 2015-11-14 – 2015-11-17 (×4): 40 mg via ORAL
  Filled 2015-11-13 (×4): qty 1

## 2015-11-13 MED ORDER — GABAPENTIN 300 MG PO CAPS
300.0000 mg | ORAL_CAPSULE | Freq: Three times a day (TID) | ORAL | Status: DC
  Administered 2015-11-13 – 2015-11-17 (×13): 300 mg via ORAL
  Filled 2015-11-13 (×13): qty 1

## 2015-11-13 MED ORDER — BISACODYL 5 MG PO TBEC: 5.0000 mg | DELAYED_RELEASE_TABLET | Freq: Every day | ORAL | Status: DC | PRN

## 2015-11-13 MED ORDER — ONDANSETRON HCL 4 MG/2ML IJ SOLN: 4.0000 mg | Freq: Four times a day (QID) | INTRAMUSCULAR | Status: DC | PRN

## 2015-11-13 MED ORDER — DIPHENHYDRAMINE HCL 25 MG PO CAPS
25.0000 mg | ORAL_CAPSULE | Freq: Four times a day (QID) | ORAL | Status: DC | PRN
  Administered 2015-11-13 – 2015-11-14 (×2): 25 mg via ORAL
  Filled 2015-11-13 (×2): qty 1

## 2015-11-13 MED ORDER — LABETALOL HCL 200 MG PO TABS
200.0000 mg | ORAL_TABLET | Freq: Two times a day (BID) | ORAL | Status: DC
  Administered 2015-11-13 – 2015-11-17 (×7): 200 mg via ORAL
  Filled 2015-11-13 (×9): qty 1

## 2015-11-13 MED ORDER — TRAZODONE HCL 50 MG PO TABS
25.0000 mg | ORAL_TABLET | Freq: Every evening | ORAL | Status: DC | PRN
  Filled 2015-11-13: qty 1

## 2015-11-13 MED ORDER — POLYETHYLENE GLYCOL 3350 17 G PO PACK: 17.0000 g | PACK | Freq: Every day | ORAL | Status: DC | PRN

## 2015-11-13 MED ORDER — CLOBAZAM 10 MG PO TABS
20.0000 mg | ORAL_TABLET | Freq: Two times a day (BID) | ORAL | Status: DC
  Administered 2015-11-13 – 2015-11-17 (×8): 20 mg via ORAL
  Filled 2015-11-13 (×8): qty 2

## 2015-11-13 MED ORDER — ATORVASTATIN CALCIUM 40 MG PO TABS: 40.0000 mg | ORAL_TABLET | Freq: Every day | ORAL | Status: DC

## 2015-11-13 MED ORDER — ISOSORBIDE MONONITRATE ER 60 MG PO TB24
90.0000 mg | ORAL_TABLET | Freq: Every day | ORAL | Status: DC
  Administered 2015-11-14 – 2015-11-17 (×4): 90 mg via ORAL
  Filled 2015-11-13 (×4): qty 1

## 2015-11-13 MED ORDER — PNEUMOCOCCAL VAC POLYVALENT 25 MCG/0.5ML IJ INJ
0.5000 mL | INJECTION | INTRAMUSCULAR | Status: AC
  Administered 2015-11-14: 0.5 mL via INTRAMUSCULAR
  Filled 2015-11-13: qty 0.5

## 2015-11-13 MED ORDER — ONDANSETRON HCL 4 MG PO TABS: 4.0000 mg | ORAL_TABLET | Freq: Four times a day (QID) | ORAL | Status: DC | PRN

## 2015-11-13 MED ORDER — SODIUM POLYSTYRENE SULFONATE 15 GM/60ML PO SUSP
30.0000 g | Freq: Once | ORAL | Status: AC
  Administered 2015-11-13: 30 g via ORAL
  Filled 2015-11-13: qty 120

## 2015-11-13 MED ORDER — NITROGLYCERIN 0.4 MG SL SUBL: 0.4000 mg | SUBLINGUAL_TABLET | SUBLINGUAL | Status: DC | PRN

## 2015-11-13 MED ORDER — ATORVASTATIN CALCIUM 40 MG PO TABS
40.0000 mg | ORAL_TABLET | Freq: Every day | ORAL | Status: DC
  Administered 2015-11-14 – 2015-11-16 (×3): 40 mg via ORAL
  Filled 2015-11-13 (×3): qty 1

## 2015-11-13 MED ORDER — ASPIRIN EC 81 MG PO TBEC
81.0000 mg | DELAYED_RELEASE_TABLET | Freq: Every day | ORAL | Status: DC
  Administered 2015-11-14 – 2015-11-17 (×4): 81 mg via ORAL
  Filled 2015-11-13 (×4): qty 1

## 2015-11-13 MED ORDER — CLONIDINE HCL 0.2 MG PO TABS
0.2000 mg | ORAL_TABLET | Freq: Two times a day (BID) | ORAL | Status: DC
  Administered 2015-11-13 – 2015-11-17 (×7): 0.2 mg via ORAL
  Filled 2015-11-13 (×8): qty 1

## 2015-11-13 MED ORDER — CLOPIDOGREL BISULFATE 75 MG PO TABS
75.0000 mg | ORAL_TABLET | Freq: Every evening | ORAL | Status: DC
  Administered 2015-11-13 – 2015-11-16 (×4): 75 mg via ORAL
  Filled 2015-11-13 (×4): qty 1

## 2015-11-13 MED ORDER — AMLODIPINE BESYLATE 10 MG PO TABS
10.0000 mg | ORAL_TABLET | Freq: Every day | ORAL | Status: DC
  Administered 2015-11-14 – 2015-11-17 (×4): 10 mg via ORAL
  Filled 2015-11-13 (×4): qty 1

## 2015-11-13 NOTE — ED Notes (Signed)
Patient transported to CT 

## 2015-11-13 NOTE — ED Notes (Signed)
Phlebotomy at bedside.

## 2015-11-13 NOTE — ED Notes (Signed)
IV team at bedside 

## 2015-11-13 NOTE — H&P (Signed)
History and Physical    Paul Mullins [name] DOB: [birth year] DOA: 11/13/2015  PCP: Alfonso Patten, MD  Patient coming from:  Home    Chief Complaint:  falls at home  HPI: Paul Mullins is a 65 y.o. male with a medical history significant for, but not  limited to, HTN, chronic diastolic heart failure, CKD, and seizures. Patient was discharged from the hospital late June after being hospitalized for weakness, confusion, right lower extremity weakness with fall. MRI suggested a brain tumor. Neurosurgery evaluated, patient scheduled for biopsy of the lesion late August. Upon discharge patient was sent to [facility] . Patient was rehospitalized 11/04/15 with worsening lower extremity edema, predominantly right-sided. He developed acute kidney injury secondary to overdiuresis. Cr trended down from peak of 3.16 to 2.5.  2.18.  He was ultimately discharged home instead of SNF ( ? Financial reasons) on 11/11/15. He fell in kitchen yesterday, leg were weak. He reports chronic lower extremity swelling since surgery 2 years ago.    ED Course:  Afebrile, vital signs stable WBC 9, hemoglobin 9.7, BUN 63, creatinine 3.36, BNP 102 Chest x-ray shows pulmonary vascular congestion without frank edema CT shows stable edema in the left parietal region concerning for neoplasm. No acute infarct or hemorrhage EDP originally though brain lesion biopsy was tomorrow and called Neurosurgery but biopsy actually 12/15/15.   Review of Systems: As per HPI, otherwise 10 point review of systems negative.    Past Medical History:  Diagnosis Date  . Anginal pain ([place])   . Chronic systolic CHF (congestive heart failure) [facility])    [facility]; Dr. Rich Reining  . Coronary artery disease    08/08/12 - patient denied CAD, but is followed for CHF  . Heart murmur    "since birth"; small membraneous VSD by 01/2012 echo  . Hypertension   . Peripheral vascular disease ([place])   . Ventricular  septal defect    small membranous VSD with left-to-right shunt by 02/14/12 echo HPR    Past Surgical History:  Procedure Laterality Date  . ABDOMINAL AORTAGRAM N/A 07/05/2012   Procedure: ABDOMINAL Maxcine Ham;  Surgeon: Conrad [place], MD;  Location: [facility] CATH LAB;  Service: Cardiovascular;  Laterality: N/A;  . FEMORAL-POPLITEAL BYPASS GRAFT Right 08/17/2012   Procedure: BYPASS GRAFT FEMORAL-POPLITEAL ARTERY;  Surgeon: Mal Misty, MD;  Location: [facility];  Service: Vascular;  Laterality: Right;  using non reversed Sapphenous vein with intraoperative arteriogram.  . gallstones    . NECK SURGERY    . NECK SURGERY    . stones      Social History   Social History  . Marital status: Single    Spouse name: N/A  . Number of children: N/A  . Years of education: N/A   Occupational History  . Not on file.   Social History Main Topics  . Smoking status: Former Smoker    Types: Cigarettes    Quit date: 06/09/2012  . Smokeless tobacco: Never Used  . Alcohol use No  . Drug use: No  . Sexual activity: Yes   Patient has been staying with his nephew that hospital discharge 3 days ago. Prior to that he was at a skilled nursing facility. Patient uses a walker for ambulation   Allergies  Allergen Reactions  . Ace Inhibitors Other (See Comments)    Impaired  Kidney Function    Family History  Problem Relation Age of Onset  . Diabetes Mother   . Hypertension Mother   .  Diabetes Father   . Hypertension Father     Prior to Admission medications   Medication Sig Start Date End Date Taking? Authorizing Provider  amLODipine (NORVASC) 10 MG tablet Take 1 tablet (10 mg total) by mouth daily. 11/11/15  Yes Debbe Odea, MD  aspirin EC 81 MG tablet Take 1 tablet (81 mg total) by mouth daily. 11/11/15  Yes Debbe Odea, MD  atorvastatin (LIPITOR) 40 MG tablet Take 1 tablet (40 mg total) by mouth daily. 11/11/15  Yes Debbe Odea, MD  cloBAZam (ONFI) 20 MG tablet Take 1 tablet (20 mg total) by mouth 2  (two) times daily. 11/11/15  Yes Debbe Odea, MD  cloNIDine (CATAPRES) 0.2 MG tablet Take 1 tablet (0.2 mg total) by mouth 2 (two) times daily. 11/11/15  Yes Debbe Odea, MD  clopidogrel (PLAVIX) 75 MG tablet Take 1 tablet (75 mg total) by mouth every evening. 11/11/15  Yes Debbe Odea, MD  furosemide (LASIX) 20 MG tablet Take 1 tablet (20 mg total) by mouth daily. 11/11/15  Yes Debbe Odea, MD  gabapentin (NEURONTIN) 300 MG capsule Take 1 capsule (300 mg total) by mouth 3 (three) times daily. 11/11/15  Yes Debbe Odea, MD  isosorbide mononitrate (IMDUR) 30 MG 24 hr tablet Take 3 tablets (90 mg total) by mouth daily. 11/11/15  Yes Debbe Odea, MD  labetalol (NORMODYNE) 200 MG tablet Take 1 tablet (200 mg total) by mouth 2 (two) times daily. 11/11/15  Yes Debbe Odea, MD  lacosamide (VIMPAT) 200 MG TABS tablet Take 1 tablet (200 mg total) by mouth every 12 (twelve) hours. 11/11/15  Yes Debbe Odea, MD  levETIRAcetam (KEPPRA) 750 MG tablet Take 1 tablet (750 mg total) by mouth 2 (two) times daily. 11/11/15  Yes Debbe Odea, MD  pantoprazole (PROTONIX) 40 MG tablet Take 1 tablet (40 mg total) by mouth daily. 11/11/15  Yes Debbe Odea, MD  polyethylene glycol (MIRALAX / GLYCOLAX) packet Take 17 g by mouth daily as needed. 11/11/15  Yes Debbe Odea, MD  nitroGLYCERIN (NITROSTAT) 0.4 MG SL tablet Place 1 tablet (0.4 mg total) under the tongue every 5 (five) minutes as needed for chest pain. 11/11/15   Debbe Odea, MD    Physical Exam: Vitals:   11/13/15 1015 11/13/15 1030 11/13/15 1145 11/13/15 1230  BP: 140/63 140/63 109/62 127/70  Pulse: 77 75 75 78  Resp: 12 15 12 22   Temp:  98.1 F (36.7 C)    TempSrc:  Oral    SpO2: 94% 94% 97% 94%  Weight:      Height:        Constitutional:  Pleasant, black male in NAD, calm, comfortable Vitals:   11/13/15 1015 11/13/15 1030 11/13/15 1145 11/13/15 1230  BP: 140/63 140/63 109/62 127/70  Pulse: 77 75 75 78  Resp: 12 15 12 22   Temp:  98.1 F (36.7 C)     TempSrc:  Oral    SpO2: 94% 94% 97% 94%  Weight:      Height:       Eyes: PER, lids and conjunctivae normal ENMT: Mucous membranes are moist. Posterior pharynx clear of any exudate or lesions..  Neck: normal, supple, no masses Respiratory: Bilateral upper and lower crackles . Normal respiratory effort. No accessory muscle use.  Cardiovascular: Regular rate and rhythm, no murmurs / rubs / gallops. 2 plus right lower extremity edema , 1+ right pedal pulse , 2 plus left pedal pulse.   Abdomen: Protuberant but soft , no tenderness, no masses palpated. Marland Kitchen  Bowel sounds positive.  Musculoskeletal: no clubbing / cyanosis. No joint deformity upper and lower extremities. Right lower extremity weak, difficult if not impossible for patient to lift . Difficult time trying to bend right knee  skin: no rashes,  ulcers. Small round laceration to right knee   Neurologic: Speech is slurred but patient alert and oriented, follows commands and answers questions appropriately. CN 2-12 grossly intact. Sensation intact, Strength 5/5 in all 4.  Psychiatric: Normal judgment and insight. Alert and oriented x 3. Normal mood.   Labs on Admission: I have personally reviewed following labs and imaging studies  Urine analysis:    Component Value Date/Time   COLORURINE YELLOW 11/13/2015 [place] 11/13/2015 1133   LABSPEC 1.015 11/13/2015 1133   PHURINE 5.5 11/13/2015 1133   GLUCOSEU NEGATIVE 11/13/2015 1133   [place] 11/13/2015 1133   BILIRUBINUR NEGATIVE 11/13/2015 1133   KETONESUR NEGATIVE 11/13/2015 1133   PROTEINUR NEGATIVE 11/13/2015 1133   UROBILINOGEN 0.2 08/08/2012 1258   NITRITE NEGATIVE 11/13/2015 1133   LEUKOCYTESUR NEGATIVE 11/13/2015 1133    Radiological Exams on Admission: Dg Chest 1 View  Result Date: 11/13/2015 CLINICAL DATA:  Pain following fall EXAM: CHEST 1 VIEW COMPARISON:  November 04, 2015 FINDINGS: There is no edema or consolidation. The heart is mildly enlarged  with pulmonary venous hypertension. No adenopathy. No pneumothorax. No bone lesions. IMPRESSION: Pulmonary vascular congestion. No frank edema or consolidation. No pneumothorax. Electronically Signed   By: Lowella Grip III M.D.   On: 11/13/2015 11:48  Ct Head Wo Contrast  Result Date: 11/13/2015 CLINICAL DATA:  Several recent falls EXAM: CT HEAD WITHOUT CONTRAST TECHNIQUE: Contiguous axial images were obtained from the base of the skull through the vertex without intravenous contrast. COMPARISON:  Brain MRI October 19, 2015; head CT March 04, 2016 FINDINGS: Brain: The ventricles are normal in size and configuration. The previously noted vasogenic edema in the posterior left parietal lobe remains without progression. There is no new mass effect or edema. There is no hemorrhage. There is no subdural or epidural fluid collection. No midline shift. No acute infarct evident. Vascular: There is calcification in the distal right vertebral artery. There is calcification in both carotid siphon and cavernous carotid artery regions. No hyperdense vessels are evident. Skull: Bony calvarium appears intact. Sinuses/Orbits: Orbits appear symmetric bilaterally. There are small retention cysts in each inferior maxillary antrum. There is opacification of multiple ethmoid air cells bilaterally, most severe anteriorly on both sides. There is extensive opacification in the right frontal sinus. These changes are stable. There is leftward deviation of the nasal septum. Other: There is opacification of several inferior mastoid air cells, stable. IMPRESSION: Stable vasogenic edema in the posterior left parietal region. This finding is concerning for underlying neoplasm. No new edema seen. No new mass effect. No hemorrhage or acute infarct evident. No extra-axial fluid collection. Foci of vascular arterial calcification noted. Areas of paranasal sinus disease stable as is inferior right mastoid air cell disease. Electronically Signed    By: Lowella Grip III M.D.   On: 11/13/2015 11:39   Assessment/Plan   Active Problems:   Seizure disorder (HCC)   Chronic diastolic (congestive) heart failure (HCC)   AKI (acute kidney injury) ([place])   Brain mass   Hyperkalemia      Fall at home. Just discharged home from hospital 3 days ago (prior to that admission patient had been at [facility] for deconditioning).  Etiology of falls? Probably multifactorial (generalized and RLE  weakness, volume depletion, ? Brain tumor). Head CTscan negative for acute injuries.  -admit to tele bed -hold diuretics -PT / OT consult -Social Work consult for SNF placement  AKI superimposed on CKD. Cr 3.36 up from 2.5 at time of last discharge 2 days ago. His baseline Cr is ~ 1.4.           -Hold diuretics -overall he is volume overloaded so will hold off on IV fluids -avoid nephrotoxic medications -am bmet  Chronic diastolic CHF. Vascular congestion but no edema on cxr. BNP 102. He has CHRONIC RLE edema.  -daily wt, monitor I+0 -continue imdur, norvasc. No ACEI  (CKD)      Chronic RLE edema. LE dopplers negative for DVT on recent admission.   HTN. BP controlled in ED -continue home normodyne, norvasc, and catpress, imdur  Hyperkalemia in setting of AKI. K+ 5.6. No EKG changes -kayexelate -monitor on tele -Hold diuretics -A.m. 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 No changes on today's CTscan.   PVD, s/p fem-pop.  -Continue plavix, asa  Seizure disorder, ? Secondary to brain mass.  -continue home meds. 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  -continue home PPI                                   DVT prophylaxis:   SCDs - frequent falls CodeStatus:   Full code Family Communication:    No family in room now. Will try and reach Nephew.   Disposition Plan:  Will most likely need discharge to SNF for deconditioning / falls.               Consults called:  EDP called Neurosurgery  to inform them of admission as patient for biopsy of brain tumor tomorrow.   Admission status:   Admission- Telemetry  Ad Telemetry    Tye Savoy NP [facility] Pager [phone]  If 7PM-7AM, please contact night-coverage [email or web address] Password [facility]  11/13/2015, 12:49 PM

## 2015-11-13 NOTE — ED Provider Notes (Signed)
[facility] DEPT Provider Note   CSN: [id number] Arrival date & time: 11/13/15  [id number]  First Provider Contact:  First MD Initiated Contact with Patient 11/13/15 0930        History   Chief Complaint Chief Complaint  Patient presents with  . Fall    recurrent    HPI Paul Mullins is a 65 y.o. male.  The history is provided by the patient and a relative. No language interpreter was used.  Fall    Paul Mullins is a 65 y.o. male who presents to the Emergency Department complaining of falls.  He has a history of brain mass, CHF, kidney disease. He was recently discharged from was a [facility] following an admission for CHF. He did not go back to his nursing facility because he could ambulate well per physical therapy notes at time of discharge. Patient and nephew state that he has significant difficulties with ambulation and is falling frequently and needs considerable assistance. He fell twice yesterday striking his head. He reports a generalized weakness but greatest in his right leg as well as pain in his right leg. There are reports of fevers, chest pain, cough, shortness of breath. Overall his leg swelling has improved significantly since he was hospitalized for edema and CHF.  Past Medical History:  Diagnosis Date  . Anginal pain ([place])   . Chronic systolic CHF (congestive heart failure) [facility])    [facility]; Dr. Rich Reining  . Coronary artery disease    08/08/12 - patient denied CAD, but is followed for CHF  . Heart murmur    "since birth"; small membraneous VSD by 01/2012 echo  . Hypertension   . Peripheral vascular disease ([place])   . 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 Hyperkalemia 11/13/2015  . AKI (acute kidney injury) ([place])   . Bilateral leg edema   . Brain mass   . CRI (chronic renal insufficiency)   . Leg swelling    . Normocytic anemia 11/04/2015  . Acute-on-chronic kidney injury ([place]) 11/04/2015  . Seizure disorder ([place])   . Chronic diastolic (congestive) heart failure ([place])   . Coronary artery disease involving native coronary artery of native heart without angina pectoris   . Neoplasm   . Essential hypertension 10/11/2015  . CKD (chronic kidney disease) 10/11/2015  . 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 PVD (peripheral vascular disease) ([place]) 08/07/2012    Past Surgical History:  Procedure Laterality Date  . ABDOMINAL AORTAGRAM N/A 07/05/2012   Procedure: ABDOMINAL Maxcine Ham;  Surgeon: Conrad Milpitas, MD;  Location[facility] CATH LAB;  Service: Cardiovascular;  Laterality: N/A;  . FEMORAL-POPLITEAL BYPASS GRAFT Right 08/17/2012   Procedure: BYPASS GRAFT FEMORAL-POPLITEAL ARTERY;  Surgeon: Mal Misty, MD;  Location: [facility];  Service: Vascular;  Laterality: Right;  using non reversed Sapphenous vein with intraoperative arteriogram.  . gallstones    . NECK SURGERY    . NECK SURGERY    . stones         Home Medications    Prior to Admission medications   Medication Sig Start Date End Date Taking? Authorizing Provider  amLODipine (NORVASC) 10 MG tablet Take 1 tablet (10 mg total) by mouth daily. 11/11/15  Yes Debbe Odea, MD  aspirin EC 81 MG tablet Take 1 tablet (81 mg total) by mouth  daily. 11/11/15  Yes Debbe Odea, MD  atorvastatin (LIPITOR) 40 MG tablet Take 1 tablet (40 mg total) by mouth daily. 11/11/15  Yes Debbe Odea, MD  cloBAZam (ONFI) 20 MG tablet Take 1 tablet (20 mg total) by mouth 2 (two) times daily. 11/11/15  Yes Debbe Odea, MD  cloNIDine (CATAPRES) 0.2 MG tablet Take 1 tablet (0.2 mg total) by mouth 2 (two) times daily. 11/11/15  Yes Debbe Odea, MD  clopidogrel (PLAVIX) 75 MG tablet Take 1 tablet (75 mg total) by mouth every evening. 11/11/15  Yes Debbe Odea, MD  furosemide (LASIX) 20 MG tablet Take 1 tablet (20 mg total) by mouth  daily. 11/11/15  Yes Debbe Odea, MD  gabapentin (NEURONTIN) 300 MG capsule Take 1 capsule (300 mg total) by mouth 3 (three) times daily. 11/11/15  Yes Debbe Odea, MD  isosorbide mononitrate (IMDUR) 30 MG 24 hr tablet Take 3 tablets (90 mg total) by mouth daily. 11/11/15  Yes Debbe Odea, MD  labetalol (NORMODYNE) 200 MG tablet Take 1 tablet (200 mg total) by mouth 2 (two) times daily. 11/11/15  Yes Debbe Odea, MD  lacosamide (VIMPAT) 200 MG TABS tablet Take 1 tablet (200 mg total) by mouth every 12 (twelve) hours. 11/11/15  Yes Debbe Odea, MD  levETIRAcetam (KEPPRA) 750 MG tablet Take 1 tablet (750 mg total) by mouth 2 (two) times daily. 11/11/15  Yes Debbe Odea, MD  pantoprazole (PROTONIX) 40 MG tablet Take 1 tablet (40 mg total) by mouth daily. 11/11/15  Yes Debbe Odea, MD  polyethylene glycol (MIRALAX / GLYCOLAX) packet Take 17 g by mouth daily as needed. 11/11/15  Yes Debbe Odea, MD  nitroGLYCERIN (NITROSTAT) 0.4 MG SL tablet Place 1 tablet (0.4 mg total) under the tongue every 5 (five) minutes as needed for chest pain. 11/11/15   Debbe Odea, MD    Family History Family History  Problem Relation Age of Onset  . Diabetes Mother   . Hypertension Mother   . Diabetes Father   . Hypertension Father     Social History Social History  Substance Use Topics  . Smoking status: Former Smoker    Types: Cigarettes    Quit date: 06/09/2012  . Smokeless tobacco: Never Used  . Alcohol use No     Allergies   Ace inhibitors   Review of Systems Review of Systems  All other systems reviewed and are negative.    Physical Exam Updated Vital Signs BP 104/73 (BP Location: Right Arm)   Pulse 74   Temp 97.8 F (36.6 C) (Oral)   Resp 18   Ht 5\' 6"  (1.676 m)   Wt 184 lb 9.6 oz (83.7 kg)   SpO2 100%   BMI 29.80 kg/m   Physical Exam  Constitutional: He appears well-developed.  Chronically ill-appearing  HENT:  Head: Normocephalic and atraumatic.  Cardiovascular: Normal rate  and regular rhythm.   No murmur heard. Pulmonary/Chest: Effort normal and breath sounds normal. No respiratory distress.  Abdominal: Soft. There is no tenderness. There is no rebound and no guarding.  Musculoskeletal:  2+ pitting edema of the left lower extremity, 3+ edema of the right lower extremity.  Neurological:  Drowsy, generalized weakness but considerable weakness in the right leg. Sensation light touch intact in all 4 extremities. Moderate weakness of facial muscles.  Skin: Skin is warm and dry.  Psychiatric: He has a normal mood and affect. His behavior is normal.  Nursing note and vitals reviewed.    ED Treatments / Results  Labs (all labs ordered are listed, but only abnormal results are displayed) Labs Reviewed  COMPREHENSIVE METABOLIC PANEL - Abnormal; Notable for the following:       Result Value   Potassium 5.6 (*)    CO2 21 (*)    Glucose, Bld 108 (*)    BUN 63 (*)    Creatinine, Ser 3.36 (*)    Total Protein 8.4 (*)    GFR calc non Af Amer 18 (*)    GFR calc Af Amer 20 (*)    All other components within normal limits  CBC WITH DIFFERENTIAL/PLATELET - Abnormal; Notable for the following:    RBC 3.24 (*)    Hemoglobin 9.7 (*)    HCT 30.7 (*)    Eosinophils Absolute 1.5 (*)    All other components within normal limits  BRAIN NATRIURETIC PEPTIDE - Abnormal; Notable for the following:    B Natriuretic Peptide 102.6 (*)    All other components within normal limits  URINALYSIS, ROUTINE W REFLEX MICROSCOPIC (NOT AT [facility])  BASIC METABOLIC PANEL  CBC    EKG  EKG Interpretation None       Radiology Dg Chest 1 View  Result Date: 11/13/2015 CLINICAL DATA:  Pain following fall EXAM: CHEST 1 VIEW COMPARISON:  November 04, 2015 FINDINGS: There is no edema or consolidation. The heart is mildly enlarged with pulmonary venous hypertension. No adenopathy. No pneumothorax. No bone lesions. IMPRESSION: Pulmonary vascular congestion. No frank edema or consolidation. No  pneumothorax. Electronically Signed   By: Lowella Grip III M.D.   On: 11/13/2015 11:48  Ct Head Wo Contrast  Result Date: 11/13/2015 CLINICAL DATA:  Several recent falls EXAM: CT HEAD WITHOUT CONTRAST TECHNIQUE: Contiguous axial images were obtained from the base of the skull through the vertex without intravenous contrast. COMPARISON:  Brain MRI October 19, 2015; head CT March 04, 2016 FINDINGS: Brain: The ventricles are normal in size and configuration. The previously noted vasogenic edema in the posterior left parietal lobe remains without progression. There is no new mass effect or edema. There is no hemorrhage. There is no subdural or epidural fluid collection. No midline shift. No acute infarct evident. Vascular: There is calcification in the distal right vertebral artery. There is calcification in both carotid siphon and cavernous carotid artery regions. No hyperdense vessels are evident. Skull: Bony calvarium appears intact. Sinuses/Orbits: Orbits appear symmetric bilaterally. There are small retention cysts in each inferior maxillary antrum. There is opacification of multiple ethmoid air cells bilaterally, most severe anteriorly on both sides. There is extensive opacification in the right frontal sinus. These changes are stable. There is leftward deviation of the nasal septum. Other: There is opacification of several inferior mastoid air cells, stable. IMPRESSION: Stable vasogenic edema in the posterior left parietal region. This finding is concerning for underlying neoplasm. No new edema seen. No new mass effect. No hemorrhage or acute infarct evident. No extra-axial fluid collection. Foci of vascular arterial calcification noted. Areas of paranasal sinus disease stable as is inferior right mastoid air cell disease. Electronically Signed   By: Lowella Grip III M.D.   On: 11/13/2015 11:39   Procedures Procedures (including critical care time)  Medications Ordered in ED Medications    amLODipine (NORVASC) tablet 10 mg (not administered)  aspirin EC tablet 81 mg (0 mg Oral Hold 11/13/15 1515)  cloBAZam (ONFI) tablet 20 mg (not administered)  cloNIDine (CATAPRES) tablet 0.2 mg (0 mg Oral Hold 11/13/15 1516)  clopidogrel (PLAVIX) tablet 75 mg (not  administered)  gabapentin (NEURONTIN) capsule 300 mg (not administered)  isosorbide mononitrate (IMDUR) 24 hr tablet 90 mg (0 mg Oral Hold 11/13/15 1517)  labetalol (NORMODYNE) tablet 200 mg (200 mg Oral Not Given 11/13/15 1445)  levETIRAcetam (KEPPRA) tablet 750 mg (0 mg Oral Hold 11/13/15 1517)  nitroGLYCERIN (NITROSTAT) SL tablet 0.4 mg (not administered)  pantoprazole (PROTONIX) EC tablet 40 mg (0 mg Oral Hold 11/13/15 1517)  polyethylene glycol (MIRALAX / GLYCOLAX) packet 17 g (not administered)  traZODone (DESYREL) tablet 25 mg (not administered)  bisacodyl (DULCOLAX) EC tablet 5 mg (not administered)  ondansetron (ZOFRAN) tablet 4 mg (not administered)    Or  ondansetron (ZOFRAN) injection 4 mg (not administered)  atorvastatin (LIPITOR) tablet 40 mg (not administered)  sodium polystyrene (KAYEXALATE) 15 GM/60ML suspension 30 g (not administered)     Initial Impression / Assessment and Plan / ED Course  I have reviewed the triage vital signs and the nursing notes.  Pertinent labs & imaging results that were available during my care of the patient were reviewed by me and considered in my medical decision making (see chart for details).  Clinical Course    Patient here for evaluation of generalized weakness, frequent falls. He was recently discharged following admission for CHF. He does have focal right lower extremity weakness as well as mild lethargy and confusion. Family states that that is near his baseline. BMP demonstrates worsening renal function with hyperkalemia. EKG without any acute changes. Plan to admit to hospitalist service for further treatment of his kidney disease as well as weakness/falls.  Final Clinical  Impressions(s) / ED Diagnoses   Final diagnoses:  Fall at home, initial encounter  Acute kidney injury [facility])  Hyperkalemia    New Prescriptions Current Discharge Medication List       Quintella Reichert, MD 11/13/15 1731

## 2015-11-13 NOTE — ED Notes (Signed)
IV team at bedside with the patient.

## 2015-11-13 NOTE — ED Triage Notes (Signed)
Patient coming from home with c/o of having recurrent falls since discharge from [facility] on 7/26.  Patient and family unsure if the patient hit his head during multiple falls.  Swelling in bilateral legs and stomach.  Pain is 5/10 in the right legs.

## 2015-11-13 NOTE — Progress Notes (Signed)
Patient arrived in the unit at 15:20 pm accompanied by nursing staff . Orientation to the unit given. Patient verbalizes understanding.

## 2015-11-13 NOTE — ED Notes (Signed)
Patient in CT

## 2015-11-14 DIAGNOSIS — E875 Hyperkalemia: Secondary | ICD-10-CM

## 2015-11-14 DIAGNOSIS — G40909 Epilepsy, unspecified, not intractable, without status epilepticus: Secondary | ICD-10-CM

## 2015-11-14 DIAGNOSIS — W19XXXA Unspecified fall, initial encounter: Secondary | ICD-10-CM | POA: Diagnosis not present

## 2015-11-14 DIAGNOSIS — I5032 Chronic diastolic (congestive) heart failure: Secondary | ICD-10-CM | POA: Diagnosis not present

## 2015-11-14 DIAGNOSIS — N179 Acute kidney failure, unspecified: Principal | ICD-10-CM

## 2015-11-14 DIAGNOSIS — G939 Disorder of brain, unspecified: Secondary | ICD-10-CM | POA: Diagnosis not present

## 2015-11-14 LAB — BASIC METABOLIC PANEL
ANION GAP: 9 (ref 5–15)
BUN: 53 mg/dL — ABNORMAL HIGH (ref 6–20)
CHLORIDE: 106 mmol/L (ref 101–111)
CO2: 23 mmol/L (ref 22–32)
CREATININE: 2.84 mg/dL — AB (ref 0.61–1.24)
Calcium: 8.9 mg/dL (ref 8.9–10.3)
GFR calc non Af Amer: 22 mL/min — ABNORMAL LOW (ref >60)
GFR, EST AFRICAN AMERICAN: 25 mL/min — AB (ref >60)
Glucose, Bld: 112 mg/dL — ABNORMAL HIGH (ref 65–99)
Potassium: 4.3 mmol/L (ref 3.5–5.1)
SODIUM: 138 mmol/L (ref 135–145)

## 2015-11-14 LAB — CBC
HCT: 30.6 % — ABNORMAL LOW (ref 39.0–52.0)
HEMOGLOBIN: 9.5 g/dL — AB (ref 13.0–17.0)
MCH: 30 pg (ref 26.0–34.0)
MCHC: 31 g/dL (ref 30.0–36.0)
MCV: 96.5 fL (ref 78.0–100.0)
PLATELETS: 292 10*3/uL (ref 150–400)
RBC: 3.17 MIL/uL — AB (ref 4.22–5.81)
RDW: 15.3 % (ref 11.5–15.5)
WBC: 6.5 10*3/uL (ref 4.0–10.5)

## 2015-11-14 MED ORDER — ACETAMINOPHEN 325 MG PO TABS
650.0000 mg | ORAL_TABLET | Freq: Four times a day (QID) | ORAL | Status: DC | PRN
  Administered 2015-11-14 – 2015-11-17 (×2): 650 mg via ORAL
  Filled 2015-11-14 (×2): qty 2

## 2015-11-14 NOTE — Evaluation (Signed)
Physical Therapy Evaluation Patient Details Name: Paul Mullins MRN: [id number] DOB: [birth year] Today's Date: 11/14/2015   History of Present Illness  Paul Mullins is a 65 y.o. male with a medical history significant for, but not  limited to, HTN, chronic diastolic heart failure, CKD, and seizures. Patient was discharged from the hospital late June after being hospitalized for weakness, confusion, right lower extremity weakness with fall. MRI suggested a brain tumor. Patient was rehospitalized 11/04/15 with worsening lower extremity edema. He was ultimately discharged home instead of SNF ( ? Financial reasons) on 11/11/15.  Clinical Impression  Pt admitted with above diagnosis. Pt currently with functional limitations due to the deficits listed below (see PT Problem List). Demonstrates instability with gait, requiring cues for safety and appropriate walker use. Pt will benefit from skilled PT to increase their independence and safety with mobility to allow discharge to the venue listed below.       Follow Up Recommendations SNF (If family cannot provide 24/7 supervision) Pt reports he has supervision at home but despite this, he has had falls. I suggest skilled supervision until pt becomes more stable and independent/safe with mobility prior to returning home with his nephew.    Equipment Recommendations  None recommended by PT    Recommendations for Other Services OT consult     Precautions / Restrictions Precautions Precautions: Fall Restrictions Weight Bearing Restrictions: No      Mobility  Bed Mobility               General bed mobility comments: In recliner  Transfers Overall transfer level: Needs assistance Equipment used: Rolling walker (2 wheeled) Transfers: Sit to/from Stand Sit to Stand: Min guard         General transfer comment: Min guard for safety. VC for hand placement and walker use upon standing.  Ambulation/Gait Ambulation/Gait assistance: Min  guard Ambulation Distance (Feet): 125 Feet Assistive device: Rolling walker (2 wheeled) Gait Pattern/deviations: Step-through pattern;Decreased stance time - right;Decreased stride length;Decreased weight shift to right;Shuffle;Trunk flexed;Wide base of support Gait velocity: decreased Gait velocity interpretation: Below normal speed for age/gender General Gait Details: Intermittent cues for walker placement especially with turns. Min guard for safety, demonstrating mild-mod reliance on RW for support at times. No buckling of lower extremities however required cues throughout for safety with this device.  Stairs            Wheelchair Mobility    Modified Rankin (Stroke Patients Only)       Balance Overall balance assessment: Needs assistance Sitting-balance support: No upper extremity supported;Feet supported Sitting balance-Leahy Scale: Good     Standing balance support: No upper extremity supported Standing balance-Leahy Scale: Fair Standing balance comment: Able to release RW however demonstrates instability with posterior lean                             Pertinent Vitals/Pain Pain Assessment: No/denies pain    Home Living Family/patient expects to be discharged to:: Skilled nursing facility Living Arrangements: Other relatives Available Help at Discharge: Family;Available 24 hours/day (Nephew) Type of Home: House Home Access: Stairs to enter Entrance Stairs-Rails: None Entrance Stairs-Number of Steps: 1-2 Home Layout: One level Home Equipment: Walker - 2 wheels      Prior Function Level of Independence: Needs assistance   Gait / Transfers Assistance Needed: Using walker for short distances  ADL's / Homemaking Assistance Needed: nephew cooked meals, pt bathed at sink  Hand Dominance   Dominant Hand: Right    Extremity/Trunk Assessment   Upper Extremity Assessment: Defer to OT evaluation           Lower Extremity Assessment:  Generalized weakness (RLE edematous)         Communication   Communication: HOH  Cognition Arousal/Alertness: Awake/alert Behavior During Therapy: WFL for tasks assessed/performed Overall Cognitive Status: No family/caregiver present to determine baseline cognitive functioning (Oriented to x3, questionable hx) Area of Impairment: Safety/judgement         Safety/Judgement: Decreased awareness of safety;Decreased awareness of deficits          General Comments General comments (skin integrity, edema, etc.): RLE edematous    Exercises General Exercises - Lower Extremity Ankle Circles/Pumps: AROM;Both;5 reps;Seated      Assessment/Plan    PT Assessment Patient needs continued PT services  PT Diagnosis Difficulty walking;Abnormality of gait;Generalized weakness   PT Problem List Decreased strength;Decreased activity tolerance;Decreased balance;Decreased mobility;Decreased cognition;Decreased knowledge of use of DME;Decreased safety awareness  PT Treatment Interventions DME instruction;Gait training;Functional mobility training;Therapeutic activities;Therapeutic exercise;Balance training;Cognitive remediation;Patient/family education   PT Goals (Current goals can be found in the Care Plan section) Acute Rehab PT Goals Patient Stated Goal: To regain independence PT Goal Formulation: With patient Time For Goal Achievement: 11/28/15 Potential to Achieve Goals: Good    Frequency Min 2X/week   Barriers to discharge        Co-evaluation               End of Session Equipment Utilized During Treatment: Gait belt Activity Tolerance: Patient tolerated treatment well Patient left: in chair;with call bell/phone within reach;with chair alarm set Nurse Communication: Mobility status    Functional Assessment Tool Used: clinical observation Functional Limitation: Mobility: Walking and moving around Mobility: Walking and Moving Around Current Status [phone]): At least 20  percent but less than 40 percent impaired, limited or restricted Mobility: Walking and Moving Around Goal Status [phone]): At least 1 percent but less than 20 percent impaired, limited or restricted    Time: [id number] PT Time Calculation (min) (ACUTE ONLY): 19 min   Charges:   PT Evaluation $PT Eval Low Complexity: 1 Procedure     PT G Codes:   PT G-Codes **NOT FOR INPATIENT CLASS** Functional Assessment Tool Used: clinical observation Functional Limitation: Mobility: Walking and moving around Mobility: Walking and Moving Around Current Status [id number]): At least 20 percent but less than 40 percent impaired, limited or restricted Mobility: Walking and Moving Around Goal Status [phone]): At least 1 percent but less than 20 percent impaired, limited or restricted    Ellouise Newer 11/14/2015, 3:13 PM  Camille Bal [place], [place]

## 2015-11-14 NOTE — Progress Notes (Signed)
PROGRESS NOTE    Paul Mullins  [id number] DOB: 11-10-1950 DOA: 11/13/2015 PCP: Pcp Not In System   Brief Narrative:  HPI on 11/13/2015 by Ms. Tye Savoy, NP Paul Mullins is a 64 y.o. male with a medical history significant for, but not  limited to, HTN, chronic diastolic heart failure, CKD, and seizures. Patient was discharged from the hospital late June after being hospitalized for weakness, confusion, right lower extremity weakness with fall. MRI suggested a brain tumor. Neurosurgery evaluated, patient scheduled for biopsy of the lesion late August. Upon discharge patient was sent to [facility] . Patient was rehospitalized 11/04/15 with worsening lower extremity edema, predominantly right-sided. He developed acute kidney injury secondary to overdiuresis. Cr trended down from peak of 3.16 to 2.5.  2.18.  He was ultimately discharged home instead of SNF ( ? Financial reasons) on 11/11/15. He fell in kitchen yesterday, leg were weak. He reports chronic lower extremity swelling since surgery 2 years ago.    Assessment & Plan   Fall at home -Recently discharged on 7/25/201. Prior to that admission patient had been at SNF for deconditioning -Unclear Etiology, Probably multifactorial (generalized and RLE weakness, volume depletion, ? Brain tumor).  -Head CTscan negative for acute injuries.  -PT / OT consulted  -Patient will likely need SNF  Acute on chronic kidney disease, stage III-IV -Creatinine was 2.5 on 7/25 -Upon admission, creatinine 3.36 -Diuretics held, continue to monitor BMP closely  Chronic diastolic CHF  -Vascular congestion but no edema on cxr. BNP 102. He has CHRONIC RLE edema.  -Monitor daily weights, intake/output -Diuretics held due to AKI -continue imdur, norvasc. No ACEI  (CKD)      Chronic RLE edema  -LE dopplers negative for DVT on recent admission.   Essential hypertension -continue normodyne, norvasc, and catpress, imdur  Hyperkalemia in setting  of AKI.  -Resolved -Potassium was 5.6 upon admission.  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 No changes on CTscan.   PVD, s/p fem-pop.  -Continue plavix, asa  Seizure disorder -? Secondary to brain mass.  -continue home meds. 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 Pending PT/OT  Consultants None  Procedures  None  Antibiotics   Anti-infectives    None      Subjective:   Paul Mullins seen and examined today.  Feels his legs are very weak.  He denies chest pain, shortness of breath, abdominal pain, nausea, vomiting, dizziness, headache.  Objective:   Vitals:   11/14/15 0022 11/14/15 0525 11/14/15 0900 11/14/15 0953  BP: (!) 123/50 (!) 159/61 (!) 121/49 (!) 115/54  Pulse: 77 83 81   Resp: 19 20 20    Temp: 97.6 F (36.4 C) 97.9 F (36.6 C) 98.5 F (36.9 C)   TempSrc: Oral Oral Oral   SpO2: 96% 97% 97%   Weight:  82.4 kg (181 lb 9.6 oz)    Height:        Intake/Output Summary (Last 24 hours) at 11/14/15 1233 Last data filed at 11/14/15 0900  Gross per 24 hour  Intake              720 ml  Output              900 ml  Net             -  180 ml   Filed Weights   11/13/15 0924 11/13/15 1546 11/14/15 0525  Weight: 90.7 kg (200 lb) 83.7 kg (184 lb 9.6 oz) 82.4 kg (181 lb 9.6 oz)    Exam  General: Well developed, well nourished, chronically ill, NAD  HEENT: NCAT, PERRLA, EOMI, Anicteic Sclera, mucous membranes moist.   Neck: Supple,no masses  Cardiovascular: S1 S2 auscultated, no rubs, murmurs or gallops. Regular rate and rhythm.  Respiratory: Diminished but clear, equal chest rise  Abdomen: Soft, nontender, nondistended, + bowel sounds  Extremities: warm dry without cyanosis clubbing. +LE  edema  Neuro: AAOx3, mildly slurred speech, otherwise nonfocal  Skin: Without rashes exudates or nodules  Psych: Normal affect and demeanor with intact judgement and insight   Data Reviewed: I have personally reviewed following labs and imaging studies  CBC:  Recent Labs Lab 11/13/15 1021 11/14/15 0225  WBC 8.9 6.5  NEUTROABS 5.0  --   HGB 9.7* 9.5*  HCT 30.7* 30.6*  MCV 94.8 96.5  PLT [id number]   Basic Metabolic Panel:  Recent Labs Lab 11/08/15 0507 11/09/15 0021 11/10/15 0950 11/11/15 0522 11/13/15 1021 11/14/15 0225  NA 138 138 136  --  135 138  K 4.9 4.9 5.0  --  5.6* 4.3  CL 101 104 104  --  105 106  CO2 27 26 25   --  21* 23  GLUCOSE 114* 110* 131*  --  108* 112*  BUN 43* 45* 44*  --  63* 53*  CREATININE 3.16* 3.00* 2.69* 2.50* 3.36* 2.84*  CALCIUM 8.8* 8.5* 8.7*  --  9.1 8.9   GFR: Estimated Creatinine Clearance: 25.8 mL/min (by C-G formula based on SCr of 2.84 mg/dL). Liver Function Tests:  Recent Labs Lab 11/13/15 1021  AST 29  ALT 24  ALKPHOS 78  BILITOT 0.4  PROT 8.4*  ALBUMIN 3.5   No results for input(s): LIPASE, AMYLASE in the last 168 hours. No results for input(s): AMMONIA in the last 168 hours. Coagulation Profile: No results for input(s): INR, PROTIME in the last 168 hours. Cardiac Enzymes: No results for input(s): CKTOTAL, CKMB, CKMBINDEX, TROPONINI in the last 168 hours. BNP (last 3 results) No results for input(s): PROBNP in the last 8760 hours. HbA1C: No results for input(s): HGBA1C in the last 72 hours. CBG:  Recent Labs Lab 11/13/15 2201  GLUCAP 129*   Lipid Profile: No results for input(s): CHOL, HDL, LDLCALC, TRIG, CHOLHDL, LDLDIRECT in the last 72 hours. Thyroid Function Tests: No results for input(s): TSH, T4TOTAL, FREET4, T3FREE, THYROIDAB in the last 72 hours. Anemia Panel: No results for input(s): VITAMINB12, FOLATE, FERRITIN, TIBC, IRON, RETICCTPCT in the last 72 hours. Urine analysis:    Component Value  Date/Time   COLORURINE YELLOW 11/13/2015 1133   APPEARANCEUR CLEAR 11/13/2015 1133   LABSPEC 1.015 11/13/2015 1133   PHURINE 5.5 11/13/2015 1133   GLUCOSEU NEGATIVE 11/13/2015 1133   HGBUR NEGATIVE 11/13/2015 1133   BILIRUBINUR NEGATIVE 11/13/2015 1133   KETONESUR NEGATIVE 11/13/2015 1133   PROTEINUR NEGATIVE 11/13/2015 1133   UROBILINOGEN 0.2 08/08/2012 1258   NITRITE NEGATIVE 11/13/2015 1133   LEUKOCYTESUR NEGATIVE 11/13/2015 1133   Sepsis Labs: @LABRCNTIP (procalcitonin:4,lacticidven:4)  ) Recent Results (from the past 240 hour(s))  MRSA PCR Screening     Status: None   Collection Time: 11/04/15  9:50 PM  Result Value Ref Range Status   MRSA by PCR NEGATIVE NEGATIVE Final    Comment:        The GeneXpert MRSA Assay (FDA  approved for NASAL specimens only), is one component of a comprehensive MRSA colonization surveillance program. It is not intended to diagnose MRSA infection nor to guide or monitor treatment for MRSA infections.       Radiology Studies: Dg Chest 1 View  Result Date: 11/13/2015 CLINICAL DATA:  Pain following fall EXAM: CHEST 1 VIEW COMPARISON:  November 04, 2015 FINDINGS: There is no edema or consolidation. The heart is mildly enlarged with pulmonary venous hypertension. No adenopathy. No pneumothorax. No bone lesions. IMPRESSION: Pulmonary vascular congestion. No frank edema or consolidation. No pneumothorax. Electronically Signed   By: Lowella Grip III M.D.   On: 11/13/2015 11:48  Ct Head Wo Contrast  Result Date: 11/13/2015 CLINICAL DATA:  Several recent falls EXAM: CT HEAD WITHOUT CONTRAST TECHNIQUE: Contiguous axial images were obtained from the base of the skull through the vertex without intravenous contrast. COMPARISON:  Brain MRI October 19, 2015; head CT March 04, 2016 FINDINGS: Brain: The ventricles are normal in size and configuration. The previously noted vasogenic edema in the posterior left parietal lobe remains without progression. There  is no new mass effect or edema. There is no hemorrhage. There is no subdural or epidural fluid collection. No midline shift. No acute infarct evident. Vascular: There is calcification in the distal right vertebral artery. There is calcification in both carotid siphon and cavernous carotid artery regions. No hyperdense vessels are evident. Skull: Bony calvarium appears intact. Sinuses/Orbits: Orbits appear symmetric bilaterally. There are small retention cysts in each inferior maxillary antrum. There is opacification of multiple ethmoid air cells bilaterally, most severe anteriorly on both sides. There is extensive opacification in the right frontal sinus. These changes are stable. There is leftward deviation of the nasal septum. Other: There is opacification of several inferior mastoid air cells, stable. IMPRESSION: Stable vasogenic edema in the posterior left parietal region. This finding is concerning for underlying neoplasm. No new edema seen. No new mass effect. No hemorrhage or acute infarct evident. No extra-axial fluid collection. Foci of vascular arterial calcification noted. Areas of paranasal sinus disease stable as is inferior right mastoid air cell disease. Electronically Signed   By: Lowella Grip III M.D.   On: 11/13/2015 11:39    Scheduled Meds: . amLODipine  10 mg Oral Daily  . aspirin EC  81 mg Oral Daily  . atorvastatin  40 mg Oral q1800  . cloBAZam  20 mg Oral BID  . cloNIDine  0.2 mg Oral BID  . clopidogrel  75 mg Oral QPM  . gabapentin  300 mg Oral TID  . isosorbide mononitrate  90 mg Oral Daily  . labetalol  200 mg Oral BID  . levETIRAcetam  750 mg Oral BID  . pantoprazole  40 mg Oral Daily  . pneumococcal 23 valent vaccine  0.5 mL Intramuscular Tomorrow-1000   Continuous Infusions:    LOS: 0 days   Time Spent in minutes   30 minutes  Joell Mullins D.O. on 11/14/2015 at 12:33 PM  Between 7am to 7pm - Pager - [phone]  After 7pm go to [email or web address] -  password [email or web address]  And look for the night coverage person covering for me after hours  [facility] Office  [phone]

## 2015-11-15 DIAGNOSIS — G939 Disorder of brain, unspecified: Secondary | ICD-10-CM | POA: Diagnosis not present

## 2015-11-15 DIAGNOSIS — W19XXXA Unspecified fall, initial encounter: Secondary | ICD-10-CM | POA: Diagnosis not present

## 2015-11-15 DIAGNOSIS — I5032 Chronic diastolic (congestive) heart failure: Secondary | ICD-10-CM | POA: Diagnosis not present

## 2015-11-15 DIAGNOSIS — N179 Acute kidney failure, unspecified: Secondary | ICD-10-CM | POA: Diagnosis not present

## 2015-11-15 LAB — BASIC METABOLIC PANEL
Anion gap: 8 (ref 5–15)
BUN: 48 mg/dL — ABNORMAL HIGH (ref 6–20)
CHLORIDE: 106 mmol/L (ref 101–111)
CO2: 24 mmol/L (ref 22–32)
CREATININE: 2.54 mg/dL — AB (ref 0.61–1.24)
Calcium: 8.8 mg/dL — ABNORMAL LOW (ref 8.9–10.3)
GFR, EST AFRICAN AMERICAN: 29 mL/min — AB (ref >60)
GFR, EST NON AFRICAN AMERICAN: 25 mL/min — AB (ref >60)
Glucose, Bld: 100 mg/dL — ABNORMAL HIGH (ref 65–99)
POTASSIUM: 4.4 mmol/L (ref 3.5–5.1)
SODIUM: 138 mmol/L (ref 135–145)

## 2015-11-15 MED ORDER — DIPHENHYDRAMINE-ZINC ACETATE 2-0.1 % EX CREA
TOPICAL_CREAM | Freq: Three times a day (TID) | CUTANEOUS | Status: DC | PRN
  Administered 2015-11-15: 21:00:00 via TOPICAL
  Filled 2015-11-15: qty 28

## 2015-11-15 NOTE — Progress Notes (Addendum)
LCSW received call from MD as well as consult for SNF.  Please see most recent assessment as patient was just discharged from [facility]. Patient was worked up for SNF however he maintains walking from 181ft-175ft which does NOT qualify him for SNF level of care. No changes from recent assessment, patient coming from home with falls.   Most appropriate level of care was referred per notes from [facility] on 7/26 for ALF.  ALF has referral however unable to reach admission for ALF as it is a weekend day (Sunday).  Will have CSW follow up with referral and understand if patient would qualify and if agreeable to ALF placement to ensure more safety, supervision, and needs to prevent from additional hospitalizations.  MD aware of plan and agreeable.  Will follow up.  Lane Hacker, MSW Clinical Social Work: [facility] [phone]    Clinical Social Work Assessment  Patient Details  Name: Paul Mullins MRN: [id number] Date of Birth: 04-20-1950  Date of referral:  11/05/15                         Reason for consult:  Discharge Planning                                     Permission sought to share information with:  Facility Art therapist granted to share information::  Yes, Release of Information Signed                       Name::                             Agency::                          Relationship::                          Contact Information:     Housing/Transportation Living arrangements for the past 2 months:  [place] of Information:  Patient Patient Interpreter Needed:  None Criminal Activity/Legal Involvement Pertinent to Current Situation/Hospitalization:    Significant Relationships:    Lives with:  Self Do you feel safe going back to the place where you live?  No Need for family participation in patient care:  Yes (Comment)  Care giving concerns:  CSW received consult to discuss discharge plans.    Social  Worker assessment / plan: CSW spoke with patient at bedside about his discharge plan and needs. Patient is from [facility]. Patient was receiving short-term rehab and came to the ER. Patient states that he would like to return to [place] once he is ready to discharge.   Employment status:  Disabled (Comment on whether or not currently receiving Disability) Insurance information:  Managed Medicare PT Recommendations:  Not assessed at this time Information / Referral to community resources:  [place]  Patient/Family's Response to care: Patient was apprecaitive of CSW.   Patient/Family's Understanding of and Emotional Response to Diagnosis, Current Treatment, and Prognosis: Patient did not have many questions about his current discharge plans and did not want to look into any other rehab facilities. Patient seemed to understand his current treatment and the process to return to short-term rehab.  Emotional Assessment Appearance:  Appears stated age Attitude/Demeanor/Rapport:    Affect (typically observed):  Stable, Accepting, Appropriate Orientation:  Oriented to Self, Oriented to Place, Oriented to  Time, Oriented to Situation Alcohol / Substance use:    Psych involvement (Current and /or in the community):  No (Comment)  Discharge Needs  Concerns to be addressed:  Discharge Planning Concerns Readmission within the last 30 days:  No Current discharge risk:  None Barriers to Discharge:  No Barriers Identified   Weston Anna, LCSW 11/05/2015, 2:48 PM        ED to Hosp-Admission (Discharged) on 11/04/2015        Detailed Report

## 2015-11-15 NOTE — Progress Notes (Signed)
PROGRESS NOTE    Paul Mullins  [name] DOB: Aug 12, 1950 DOA: 11/13/2015 PCP: Pcp Not In System   Brief Narrative:  HPI on 11/13/2015 by Ms. Tye Savoy, NP Paul Mullins is a 65 y.o. male with a medical history significant for, but not  limited to, HTN, chronic diastolic heart failure, CKD, and seizures. Patient was discharged from the hospital late June after being hospitalized for weakness, confusion, right lower extremity weakness with fall. MRI suggested a brain tumor. Neurosurgery evaluated, patient scheduled for biopsy of the lesion late August. Upon discharge patient was sent to [facility] . Patient was rehospitalized 11/04/15 with worsening lower extremity edema, predominantly right-sided. He developed acute kidney injury secondary to overdiuresis. Cr trended down from peak of 3.16 to 2.5.  2.18.  He was ultimately discharged home instead of SNF ( ? Financial reasons) on 11/11/15. He fell in kitchen yesterday, leg were weak. He reports chronic lower extremity swelling since surgery 2 years ago.    Assessment & Plan   Fall at home -Recently discharged on 7/25/201. Prior to that admission patient had been at SNF for deconditioning. -Unclear Etiology, Probably multifactorial (generalized and RLE weakness, volume depletion, ? Brain tumor).  -Head CTscan negative for acute injuries.  -PT / OT consulted. Pt recommended SNF vs supervision -Patient will likely need SNF vs assisted living   Acute on chronic kidney disease, stage III-IV -Creatinine was 2.5 on 7/25 -Currently creatinine 2.54 -Upon admission, creatinine 3.36 -Diuretics held, continue to monitor BMP closely  Chronic diastolic CHF  -Vascular congestion but no edema on cxr. BNP 102. He has CHRONIC RLE edema.  -Monitor daily weights, intake/output -Diuretics held due to AKI -continue imdur, norvasc. No ACEI  (CKD)      Chronic RLE edema  -LE dopplers negative for DVT on recent admission.   Essential  hypertension -continue normodyne, norvasc, and catpress, imdur  Hyperkalemia in setting of AKI.  -Resolved -Potassium was 5.6 upon admission.  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 No changes on CTscan.   PVD, s/p fem-pop.  -Continue plavix, asa  Seizure disorder -? Secondary to brain mass.  -continue home meds. 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 Will try to have patient placed in assisted living vs SNF. Likely discharge 7/31  Consultants None  Procedures  None  Antibiotics   Anti-infectives    None      Subjective:   Paul Mullins seen and examined today.  Feels his legs are very weak and complains of pain.  He denies chest pain, shortness of breath, abdominal pain, nausea, vomiting, dizziness, headache.  Objective:   Vitals:   11/14/15 2014 11/15/15 0134 11/15/15 0551 11/15/15 1037  BP: (!) 143/63 (!) 125/56 (!) 137/57 (!) 134/49  Pulse: 77 76 75 79  Resp: 20 (!) 21 20   Temp: 97.5 F (36.4 C) 97.1 F (36.2 C) 97.9 F (36.6 C) 97.9 F (36.6 C)  TempSrc: Oral Oral Oral Oral  SpO2: 98% 97% 100%   Weight:   84.8 kg (186 lb 14.4 oz)   Height:        Intake/Output Summary (Last 24 hours) at 11/15/15 1152 Last data filed at 11/15/15 0900  Gross per 24 hour  Intake  960 ml  Output             1351 ml  Net             -391 ml   Filed Weights   11/13/15 1546 11/14/15 0525 11/15/15 0551  Weight: 83.7 kg (184 lb 9.6 oz) 82.4 kg (181 lb 9.6 oz) 84.8 kg (186 lb 14.4 oz)    Exam  General: Well developed, well nourished, chronically ill, NAD  HEENT: NCAT,  mucous membranes moist.   Cardiovascular: S1 S2 auscultated, no murmurs, RRR  Respiratory: Diminished but clear, equal chest  rise  Abdomen: Soft, nontender, nondistended, + bowel sounds  Extremities: warm dry without cyanosis clubbing. +LE edema  Neuro: AAOx3, mildly slurred speech, otherwise nonfocal  Skin: Without rashes exudates or nodules  Psych: Normal affect and demeanor with intact judgement and insight   Data Reviewed: I have personally reviewed following labs and imaging studies  CBC:  Recent Labs Lab 11/13/15 1021 11/14/15 0225  WBC 8.9 6.5  NEUTROABS 5.0  --   HGB 9.7* 9.5*  HCT 30.7* 30.6*  MCV 94.8 96.5  PLT 279 [id number]   Basic Metabolic Panel:  Recent Labs Lab 11/09/15 0021 11/10/15 0950 11/11/15 0522 11/13/15 1021 11/14/15 0225 11/15/15 0330  NA 138 136  --  135 138 138  K 4.9 5.0  --  5.6* 4.3 4.4  CL 104 104  --  105 106 106  CO2 26 25  --  21* 23 24  GLUCOSE 110* 131*  --  108* 112* 100*  BUN 45* 44*  --  63* 53* 48*  CREATININE 3.00* 2.69* 2.50* 3.36* 2.84* 2.54*  CALCIUM 8.5* 8.7*  --  9.1 8.9 8.8*   GFR: Estimated Creatinine Clearance: 29.2 mL/min (by C-G formula based on SCr of 2.54 mg/dL). Liver Function Tests:  Recent Labs Lab 11/13/15 1021  AST 29  ALT 24  ALKPHOS 78  BILITOT 0.4  PROT 8.4*  ALBUMIN 3.5   No results for input(s): LIPASE, AMYLASE in the last 168 hours. No results for input(s): AMMONIA in the last 168 hours. Coagulation Profile: No results for input(s): INR, PROTIME in the last 168 hours. Cardiac Enzymes: No results for input(s): CKTOTAL, CKMB, CKMBINDEX, TROPONINI in the last 168 hours. BNP (last 3 results) No results for input(s): PROBNP in the last 8760 hours. HbA1C: No results for input(s): HGBA1C in the last 72 hours. CBG:  Recent Labs Lab 11/13/15 2201  GLUCAP 129*   Lipid Profile: No results for input(s): CHOL, HDL, LDLCALC, TRIG, CHOLHDL, LDLDIRECT in the last 72 hours. Thyroid Function Tests: No results for input(s): TSH, T4TOTAL, FREET4, T3FREE, THYROIDAB in the last 72 hours. Anemia Panel: No results for  input(s): VITAMINB12, FOLATE, FERRITIN, TIBC, IRON, RETICCTPCT in the last 72 hours. Urine analysis:    Component Value Date/Time   COLORURINE YELLOW 11/13/2015 1133   APPEARANCEUR CLEAR 11/13/2015 1133   LABSPEC 1.015 11/13/2015 1133   PHURINE 5.5 11/13/2015 1133   GLUCOSEU NEGATIVE 11/13/2015 1133   HGBUR NEGATIVE 11/13/2015 1133   BILIRUBINUR NEGATIVE 11/13/2015 1133   KETONESUR NEGATIVE 11/13/2015 1133   PROTEINUR NEGATIVE 11/13/2015 1133   UROBILINOGEN 0.2 08/08/2012 1258   NITRITE NEGATIVE 11/13/2015 1133   LEUKOCYTESUR NEGATIVE 11/13/2015 1133   Sepsis Labs: @LABRCNTIP (procalcitonin:4,lacticidven:4)  ) No results found for this or any previous visit (from the past 240 hour(s)).    Radiology Studies: No results found.   Scheduled Meds: . amLODipine  10 mg Oral Daily  .  aspirin EC  81 mg Oral Daily  . atorvastatin  40 mg Oral q1800  . cloBAZam  20 mg Oral BID  . cloNIDine  0.2 mg Oral BID  . clopidogrel  75 mg Oral QPM  . gabapentin  300 mg Oral TID  . isosorbide mononitrate  90 mg Oral Daily  . labetalol  200 mg Oral BID  . levETIRAcetam  750 mg Oral BID  . pantoprazole  40 mg Oral Daily   Continuous Infusions:    LOS: 0 days   Time Spent in minutes   30 minutes  Heriberto Stmartin D.O. on 11/15/2015 at 11:52 AM  Between 7am to 7pm - Pager - [phone]  After 7pm go to [email or web address] - password [email or web address]  And look for the night coverage person covering for me after hours  [facility] Office  [phone]

## 2015-11-15 NOTE — Evaluation (Signed)
Occupational Therapy Evaluation Patient Details Name: Paul Mullins MRN: [id number] DOB: 07-01-1950 Today's Date: 11/15/2015    History of Present Illness Keif Kieran is a 65 y.o. male with a medical history significant for, but not  limited to, HTN, chronic diastolic heart failure, CKD, and seizures. Patient was discharged from the hospital late June after being hospitalized for weakness, confusion, right lower extremity weakness with fall. MRI suggested a brain tumor. Patient was rehospitalized 11/04/15 with worsening lower extremity edema. He was ultimately discharged home instead of SNF ( ? Financial reasons) on 11/11/15.   Clinical Impression   Pt presents with impaired cognition/safety awareness with poor balance placing him at high risk for falls. Recommending SNF. Will follow acutely.    Follow Up Recommendations  SNF;Supervision/Assistance - 24 hour    Equipment Recommendations   (defer to next venue)    Recommendations for Other Services       Precautions / Restrictions Precautions Precautions: Fall Restrictions Weight Bearing Restrictions: No      Mobility Bed Mobility               General bed mobility comments: In recliner  Transfers Overall transfer level: Needs assistance Equipment used: Rolling walker (2 wheeled) Transfers: Sit to/from Stand Sit to Stand: Min guard         General transfer comment: Min guard for safety. VC for hand placement and walker use upon standing.    Balance     Sitting balance-Leahy Scale: Good       Standing balance-Leahy Scale: Fair                              ADL Overall ADL's : Needs assistance/impaired Eating/Feeding: Set up;Sitting   Grooming: Wash/dry hands;Min guard;Sitting   Upper Body Bathing: Supervision/ safety;Sitting   Lower Body Bathing: Minimal assistance;Sit to/from stand   Upper Body Dressing : Supervision/safety;Sitting   Lower Body Dressing: Minimal assistance;Sit  to/from stand   Toilet Transfer: Min guard;RW;Ambulation;Comfort height toilet;Grab bars   Toileting- Water quality scientist and Hygiene: Minimal assistance;Sit to/from stand       Functional mobility during ADLs: Min guard;Rolling walker;Cueing for safety General ADL Comments: Pt able to cross foot over opposite knee using B UEs to assist with increased effort.     Vision Additional Comments: difficulty reading small print, reports needing reading glasses   Perception     Praxis      Pertinent Vitals/Pain Pain Assessment: Faces Faces Pain Scale: Hurts little more Pain Location: R LE Pain Descriptors / Indicators: Sore Pain Intervention(s): Monitored during session     Hand Dominance Right   Extremity/Trunk Assessment Upper Extremity Assessment Upper Extremity Assessment: Overall WFL for tasks assessed   Lower Extremity Assessment Lower Extremity Assessment: Defer to PT evaluation   Cervical / Trunk Assessment Cervical / Trunk Assessment: Normal   Communication Communication Communication: HOH   Cognition Arousal/Alertness: Awake/alert Behavior During Therapy: WFL for tasks assessed/performed (tearful when speaking of his recent family members deaths) Overall Cognitive Status: No family/caregiver present to determine baseline cognitive functioning Area of Impairment: Safety/judgement         Safety/Judgement: Decreased awareness of safety;Decreased awareness of deficits         General Comments       Exercises       Shoulder Instructions      Home Living Family/patient expects to be discharged to:: Skilled nursing facility Living Arrangements: Other relatives (nephew) Available Help  at Discharge: Family;Available 24 hours/day Type of Home: House Home Access: Stairs to enter [employer] of Steps: 1-2 Entrance Stairs-Rails: None Home Layout: One level     Bathroom Shower/Tub: Tub/shower unit         Home Equipment: Environmental consultant - 2  wheels          Prior Functioning/Environment Level of Independence: Needs assistance  Gait / Transfers Assistance Needed: Using walker for short distances ADL's / Homemaking Assistance Needed: nephew cooked meals, pt bathed at sink, pt fearful of falling in tub        OT Diagnosis: Generalized weakness;Cognitive deficits;Acute pain   OT Problem List: Decreased activity tolerance;Impaired balance (sitting and/or standing);Decreased strength;Decreased coordination;Decreased cognition;Decreased safety awareness;Decreased knowledge of use of DME or AE;Pain;Increased edema   OT Treatment/Interventions: Self-care/ADL training;Cognitive remediation/compensation;Balance training;Patient/family education;DME and/or AE instruction    OT Goals(Current goals can be found in the care plan section) Acute Rehab OT Goals Patient Stated Goal: To regain independence OT Goal Formulation: With patient Time For Goal Achievement: 11/22/15 Potential to Achieve Goals: Good ADL Goals Pt Will Perform Grooming: with supervision;standing Pt Will Perform Lower Body Bathing: with supervision;sitting/lateral leans Pt Will Perform Lower Body Dressing: with supervision;sit to/from stand Pt Will Transfer to Toilet: with supervision;ambulating;regular height toilet Pt Will Perform Toileting - Clothing Manipulation and hygiene: with supervision;sit to/from stand Additional ADL Goal #1: Pt with identify and gather ADL items from around room with supervision.  OT Frequency: Min 2X/week   Barriers to D/C:            Co-evaluation              End of Session Equipment Utilized During Treatment: Gait belt;Rolling walker Nurse Communication:  (RN stating pt may not have any more coffee)  Activity Tolerance: Patient tolerated treatment well Patient left: in chair;with call bell/phone within reach;with chair alarm set   Time: [phone] OT Time Calculation (min): 32 min Charges:  OT General Charges $OT  Visit: 1 Procedure OT Evaluation $OT Eval Moderate Complexity: 1 Procedure OT Treatments $Self Care/Home Management : 8-22 mins G-Codes: OT G-codes **NOT FOR INPATIENT CLASS** Functional Assessment Tool Used: clinical judgement Functional Limitation: Self care Self Care Current Status [id number]): At least 1 percent but less than 20 percent impaired, limited or restricted Self Care Goal Status [id number]): At least 1 percent but less than 20 percent impaired, limited or restricted  Malka So 11/15/2015, 12:07 PM  [phone]

## 2015-11-16 DIAGNOSIS — G939 Disorder of brain, unspecified: Secondary | ICD-10-CM | POA: Diagnosis not present

## 2015-11-16 DIAGNOSIS — N179 Acute kidney failure, unspecified: Secondary | ICD-10-CM | POA: Diagnosis not present

## 2015-11-16 DIAGNOSIS — W19XXXA Unspecified fall, initial encounter: Secondary | ICD-10-CM | POA: Diagnosis not present

## 2015-11-16 DIAGNOSIS — I5032 Chronic diastolic (congestive) heart failure: Secondary | ICD-10-CM | POA: Diagnosis not present

## 2015-11-16 LAB — BASIC METABOLIC PANEL
Anion gap: 8 (ref 5–15)
BUN: 45 mg/dL — AB (ref 6–20)
CALCIUM: 8.8 mg/dL — AB (ref 8.9–10.3)
CHLORIDE: 107 mmol/L (ref 101–111)
CO2: 22 mmol/L (ref 22–32)
CREATININE: 2.39 mg/dL — AB (ref 0.61–1.24)
GFR calc non Af Amer: 27 mL/min — ABNORMAL LOW (ref >60)
GFR, EST AFRICAN AMERICAN: 31 mL/min — AB (ref >60)
GLUCOSE: 101 mg/dL — AB (ref 65–99)
Potassium: 4.5 mmol/L (ref 3.5–5.1)
Sodium: 137 mmol/L (ref 135–145)

## 2015-11-16 NOTE — Discharge Summary (Signed)
Physician Discharge Summary  Paul Mullins [name] DOB: 06-18-1950 DOA: 11/13/2015  PCP: Pcp Not In System  Admit date: 11/13/2015 Discharge date: 11/17/2015  Time spent: 45 minutes  Recommendations for Outpatient Follow-up:  Patient will be discharged to home with home health services..  Patient will need to follow up with primary care provider within one week of discharge.  Patient should continue medications as prescribed.  Patient should follow a heart healthy diet.   Discharge Diagnoses:  Fall at home Acute on chronic kidney disease, stage III-IV Chronic diastolic CHF Chronic RLE edema  Essential hypertension Hyperkalemia in setting of AKI.  Brain mass, left parietal PVD, s/p fem-pop. Seizure disorder GERD  Discharge Condition: Stable  Diet recommendation: Heart healthy  Filed Weights   11/15/15 0551 11/16/15 0446 11/17/15 0557  Weight: 84.8 kg (186 lb 14.4 oz) 86.4 kg (190 lb 8 oz) 86 kg (189 lb 8 oz)    History of present illness:  on 11/13/2015 by Ms. Tye Savoy, NP Paul Mullins a 65 y.o.malewith a medical history significant for, but not limited to, HTN, chronic diastolic heart failure, CKD, and seizures. Patient was discharged from the hospital late June after being hospitalized for weakness, confusion, right lower extremity weakness with fall. MRI suggested a brain tumor. Neurosurgery evaluated, patient scheduled for biopsy of the lesion late August. Upon discharge patient was sent to [facility] . Patient was rehospitalized 11/04/15 with worsening lower extremity edema, predominantly right-sided. He developed acute kidney injury secondary to overdiuresis. Cr trended down from peak of 3.16 to 2.5. 2.18. He was ultimately discharged home instead of SNF ( ? Financial reasons) on 11/11/15. He fell in kitchen yesterday, leg were weak. He reports chronic lower extremity swelling since surgery 2 years ago.   Hospital Course:  Fall at home -Recently  discharged on 7/25/201. Prior to that admission patient had been at SNF for deconditioning. -Unclear Etiology, Probably multifactorial (generalized and RLE weakness, volume depletion, ? Brain tumor).  -Head CTscan negative for acute injuries.  -PT / OT consulted. Pt recommended SNF vs supervision -Patient will likely need SNF vs assisted living  -Patient has been accepted by SNF, but it can take upto an additional  2 days for insurance approval.  Both patient and nephew understand this.  Will discharge him with home health in the mean time.   Acute on chronic kidney disease, stage III-IV -Creatinine was 2.5 on 7/25 -Currently creatinine 2.39 -Upon admission, creatinine 3.36 -Diuretics held, continue to monitor BMP closely  Chronic diastolic CHF -Vascular congestion but no edema on cxr. BNP 102. He has CHRONIC RLE edema.  -Monitor daily weights, intake/output -Diuretics held due to AKI -continue imdur, norvasc. No ACEI (CKD)   Chronic RLE edema  -LE dopplers negative for DVT on recent admission.   Essential hypertension -continue normodyne, norvasc, and catpress, imdur  Hyperkalemia in setting of AKI.  -Resolved -Potassium was 5.6 upon admission.  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 No changes on CTscan.   PVD, s/p fem-pop. -Continue plavix, asa  Seizure disorder -? Secondary to brain mass.  -continue home meds keppra and clobazam -seizure precautions -Received denial from [employer].  I called and spoke with 4 different representatives and tried to expedite an appeal.  Still waiting for a response.  (this is after several other physicians have faxed back information to [facility], last was 7/28 by Dr. Wynelle Cleveland) -Spoke with oncall neurologist, Dr. Shon Hale, who recommended  continuing current regimen, keppra and clobazam and not to start new medications as this could cause  problems.  GERD -continue PPI  Consultants Dr. Shon Hale, neurology, via phone  Procedures  None  Discharge Exam: Vitals:   11/16/15 2053 11/17/15 0557  BP: 128/64 (!) 128/58  Pulse: 77 76  Resp: 18 18  Temp: 97.7 F (36.5 C) 97.9 F (36.6 C)   Exam  General: Well developed, well nourished, chronically ill, NAD  HEENT: NCAT,  mucous membranes moist.   Cardiovascular: S1 S2 auscultated, no murmurs, RRR  Respiratory: Diminished but clear, equal chest rise  Abdomen: Soft, nontender, nondistended, + bowel sounds  Extremities: warm dry without cyanosis clubbing. +RLE edema > LLE  Neuro: AAOx3, mildly slurred speech, otherwise nonfocal  Skin: Without rashes exudates or nodules  Psych: Normal affect and demeanor with intact judgement and insight  Discharge Instructions  Discharge Instructions    Discharge instructions    Complete by:  As directed   Patient will be discharged to home with home health services..  Patient will need to follow up with primary care provider within one week of discharge.  Patient should continue medications as prescribed.  Patient should follow a heart healthy diet.       Medication List    STOP taking these medications   lacosamide 200 MG Tabs tablet Commonly known as:  VIMPAT     TAKE these medications   amLODipine 10 MG tablet Commonly known as:  NORVASC Take 1 tablet (10 mg total) by mouth daily.   aspirin EC 81 MG tablet Take 1 tablet (81 mg total) by mouth daily.   atorvastatin 40 MG tablet Commonly known as:  LIPITOR Take 1 tablet (40 mg total) by mouth daily.   cloBAZam 20 MG tablet Commonly known as:  ONFI Take 1 tablet (20 mg total) by mouth 2 (two) times daily.   cloNIDine 0.2 MG tablet Commonly known as:  CATAPRES Take 1 tablet (0.2 mg total) by mouth 2 (two) times daily.   clopidogrel 75 MG tablet Commonly known as:  PLAVIX Take 1 tablet (75 mg total) by mouth every evening.   furosemide 20 MG  tablet Commonly known as:  LASIX Take 1 tablet (20 mg total) by mouth daily.   gabapentin 300 MG capsule Commonly known as:  NEURONTIN Take 1 capsule (300 mg total) by mouth 3 (three) times daily.   isosorbide mononitrate 30 MG 24 hr tablet Commonly known as:  IMDUR Take 3 tablets (90 mg total) by mouth daily.   labetalol 200 MG tablet Commonly known as:  NORMODYNE Take 1 tablet (200 mg total) by mouth 2 (two) times daily.   levETIRAcetam 750 MG tablet Commonly known as:  KEPPRA Take 1 tablet (750 mg total) by mouth 2 (two) times daily.   nitroGLYCERIN 0.4 MG SL tablet Commonly known as:  NITROSTAT Place 1 tablet (0.4 mg total) under the tongue every 5 (five) minutes as needed for chest pain.   pantoprazole 40 MG tablet Commonly known as:  PROTONIX Take 1 tablet (40 mg total) by mouth daily.   polyethylene glycol packet Commonly known as:  MIRALAX / GLYCOLAX Take 17 g by mouth daily as needed.      Allergies  Allergen Reactions  . Ace Inhibitors Other (See Comments)    Impaired  Kidney Function   Follow-up Information    Dr. Dorice Lamas [facility]). Call in 1 week(s).   Why:  Follow up in 1 week, repeat CBC  and BMP (Labs)           The results of significant diagnostics from this hospitalization (including imaging, microbiology, ancillary and laboratory) are listed below for reference.    Significant Diagnostic Studies: Dg Chest 1 View  Result Date: 11/13/2015 CLINICAL DATA:  Pain following fall EXAM: CHEST 1 VIEW COMPARISON:  November 04, 2015 FINDINGS: There is no edema or consolidation. The heart is mildly enlarged with pulmonary venous hypertension. No adenopathy. No pneumothorax. No bone lesions. IMPRESSION: Pulmonary vascular congestion. No frank edema or consolidation. No pneumothorax. Electronically Signed   By: Lowella Grip III M.D.   On: 11/13/2015 11:48  Ct Head Wo Contrast  Result Date: 11/13/2015 CLINICAL DATA:  Several recent falls  EXAM: CT HEAD WITHOUT CONTRAST TECHNIQUE: Contiguous axial images were obtained from the base of the skull through the vertex without intravenous contrast. COMPARISON:  Brain MRI October 19, 2015; head CT March 04, 2016 FINDINGS: Brain: The ventricles are normal in size and configuration. The previously noted vasogenic edema in the posterior left parietal lobe remains without progression. There is no new mass effect or edema. There is no hemorrhage. There is no subdural or epidural fluid collection. No midline shift. No acute infarct evident. Vascular: There is calcification in the distal right vertebral artery. There is calcification in both carotid siphon and cavernous carotid artery regions. No hyperdense vessels are evident. Skull: Bony calvarium appears intact. Sinuses/Orbits: Orbits appear symmetric bilaterally. There are small retention cysts in each inferior maxillary antrum. There is opacification of multiple ethmoid air cells bilaterally, most severe anteriorly on both sides. There is extensive opacification in the right frontal sinus. These changes are stable. There is leftward deviation of the nasal septum. Other: There is opacification of several inferior mastoid air cells, stable. IMPRESSION: Stable vasogenic edema in the posterior left parietal region. This finding is concerning for underlying neoplasm. No new edema seen. No new mass effect. No hemorrhage or acute infarct evident. No extra-axial fluid collection. Foci of vascular arterial calcification noted. Areas of paranasal sinus disease stable as is inferior right mastoid air cell disease. Electronically Signed   By: Lowella Grip III M.D.   On: 11/13/2015 11:39  Ct Head W & Wo Contrast  Result Date: 11/02/2015 CLINICAL DATA:  65 year old male study for stereotactic surgical planning. 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 Subsequent encounter. EXAM: CT HEAD  WITHOUT AND WITH CONTRAST TECHNIQUE: Contiguous axial images were obtained from the base of the skull through the vertex without and with intravenous contrast CONTRAST:  1 ISOVUE-300 IOPAMIDOL (ISOVUE-300) INJECTION 61% COMPARISON:  Brain MRI without and with contrast 10/19/2015 and earlier. FINDINGS: The posterior left parietal lobe area of confluent abnormal T2 signal is re- demonstrated on series 21, image 98, with the abnormal signal tracking to the posterior body of the left lateral ventricle. No progression is evident. The punctate/petechial enhancement is not evident by CT. No suspicious diffusion changes noted recently. Stable minimal to mild regional mass effect. No abnormal intracranial enhancement by CT. Gray-white matter differentiation elsewhere within normal limits. Extensive Calcified atherosclerosis at the skull base. No acute intracranial hemorrhage identified. Major intracranial vascular structures are enhancing. Mild to moderate ethmoid and frontal sinus mucosal thickening is stable. Mild inferior right mastoid effusion again noted. No acute osseous abnormality identified. No acute orbit or scalp soft tissue findings. IMPRESSION: 1. Study for stereotactic surgical planning. 2. Hypodensity in the posterior left parietal lobe corresponding to  the area of MRI signal abnormality with gyral enlargement. The minimal enhancement on MRI is not evident by CT. 3. No new intracranial abnormality. Electronically Signed   By: Genevie Ann M.D.   On: 11/02/2015 16:30   Mr Jeri Cos [id number] Contrast  Result Date: 10/19/2015 CLINICAL DATA:  Continued surveillance LEFT parietal lesion. Surgical planning. EXAM: MRI HEAD WITHOUT AND WITH CONTRAST TECHNIQUE: Multiplanar, multiecho pulse sequences of the brain and surrounding structures were obtained without and with intravenous contrast. CONTRAST:  54mL MULTIHANCE GADOBENATE DIMEGLUMINE 529 MG/ML IV SOLN COMPARISON:  Multiple priors, including previous MRI examinations from  2016. FINDINGS: Moderate-sized area of T2 and FLAIR hyperintensity, involving the LEFT parietal cortex and subcortical white matter, straddling the PCA and MCA vascular territories, no significant restriction, developing vasogenic edema, gyriform swelling, petechial hemorrhage, with a paucity of enhancement. Primary brain tumor is favored. The prolonged time course and progressive worsening of the imaging findings are not consistent with vascular disease or metastasis. Abnormal T2 signal cross-section measures 31 x 48 mm on image 17 series 5. No significant change most recent MR of 10/11/2015. 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 Electronically Signed   By: Staci Righter M.D.   On: 10/19/2015 20:14   Nm Pulmonary Perf And Vent  Result Date: 11/05/2015 CLINICAL DATA:  Worsening lower extremity swelling for 2 weeks. EXAM: NUCLEAR MEDICINE VENTILATION - PERFUSION LUNG SCAN TECHNIQUE: Ventilation images were obtained in multiple projections using inhaled aerosol Tc-48m DTPA. Perfusion images were obtained in multiple projections after intravenous injection of Tc-31m MAA. RADIOPHARMACEUTICALS:  30.0 mCi Technetium-4m DTPA aerosol inhalation and 4.1 mCi Technetium-3m MAA IV COMPARISON:  Chest radiograph on 11/04/2015 FINDINGS: Ventilation: No focal ventilation defect. Perfusion: No wedge shaped peripheral perfusion defects to suggest acute pulmonary embolism. IMPRESSION: No evidence of pulmonary embolism. Electronically Signed   By: Earle Gell M.D.   On: 11/05/2015 13:07   Dg Chest Port 1 View  Result Date: 11/04/2015 CLINICAL DATA:  Bilateral leg swelling. History of congestive heart failure. Hypertension. EXAM: PORTABLE CHEST 1 VIEW COMPARISON:  Chest radiograph 10/11/2015 FINDINGS: There is shallow lung inflation. Cardiomediastinal silhouette is enlarged. There are bilateral perihilar opacities with peribronchial cuffing. No sizable pleural effusion on this semi  upright radiograph. No pneumothorax. IMPRESSION: 1. Moderate pulmonary edema. 2. Enlarged cardiomediastinal silhouette, which may indicate pericardial effusion versus cardiomegaly alone. The appearance may also be exaggerated by a combination of AP technique and shallow lung inflation. Electronically Signed   By: Ulyses Jarred M.D.   On: 11/04/2015 16:14    Microbiology: No results found for this or any previous visit (from the past 240 hour(s)).   Labs: Basic Metabolic Panel:  Recent Labs Lab 11/11/15 0522 11/13/15 1021 11/14/15 0225 11/15/15 0330 11/16/15 0347  NA  --  135 138 138 137  K  --  5.6* 4.3 4.4 4.5  CL  --  105 106 106 107  CO2  --  21* 23 24 22   GLUCOSE  --  108* 112* 100* 101*  BUN  --  63* 53* 48* 45*  CREATININE 2.50* 3.36* 2.84* 2.54* 2.39*  CALCIUM  --  9.1 8.9 8.8* 8.8*   Liver Function Tests:  Recent Labs Lab 11/13/15 1021  AST 29  ALT 24  ALKPHOS 78  BILITOT 0.4  PROT 8.4*  ALBUMIN 3.5   No results for input(s): LIPASE, AMYLASE in the last 168 hours. No results for input(s): AMMONIA in the last 168 hours. CBC:  Recent Labs Lab 11/13/15 1021 11/14/15 0225  WBC 8.9 6.5  NEUTROABS 5.0  --   HGB 9.7* 9.5*  HCT 30.7* 30.6*  MCV 94.8 96.5  PLT 279 292   Cardiac Enzymes: No results for input(s): CKTOTAL, CKMB, CKMBINDEX, TROPONINI in the last 168 hours. BNP: BNP (last 3 results)  Recent Labs  11/04/15 1540 11/13/15 1030  BNP 86.3 102.6*    ProBNP (last 3 results) No results for input(s): PROBNP in the last 8760 hours.  CBG:  Recent Labs Lab 11/13/15 2201  GLUCAP 129*       Signed:  Cristal Ford  [facility] 11/17/2015, 2:16 PM

## 2015-11-16 NOTE — Progress Notes (Signed)
PROGRESS NOTE    Paul Mullins  [name] DOB: 1950-07-08 DOA: 11/13/2015 PCP: Pcp Not In System   Brief Narrative:  HPI on 11/13/2015 by Ms. Tye Savoy, NP Paul Mullins is a 65 y.o. male with a medical history significant for, but not  limited to, HTN, chronic diastolic heart failure, CKD, and seizures. Patient was discharged from the hospital late June after being hospitalized for weakness, confusion, right lower extremity weakness with fall. MRI suggested a brain tumor. Neurosurgery evaluated, patient scheduled for biopsy of the lesion late August. Upon discharge patient was sent to [facility] . Patient was rehospitalized 11/04/15 with worsening lower extremity edema, predominantly right-sided. He developed acute kidney injury secondary to overdiuresis. Cr trended down from peak of 3.16 to 2.5.  2.18.  He was ultimately discharged home instead of SNF ( ? Financial reasons) on 11/11/15. He fell in kitchen yesterday, leg were weak. He reports chronic lower extremity swelling since surgery 2 years ago.    Assessment & Plan   Fall at home -Recently discharged on 7/25/201. Prior to that admission patient had been at SNF for deconditioning. -Unclear Etiology, Probably multifactorial (generalized and RLE weakness, volume depletion, ? Brain tumor).  -Head CTscan negative for acute injuries.  -PT / OT consulted. Pt recommended SNF vs supervision -Patient will likely need SNF vs assisted living   Acute on chronic kidney disease, stage III-IV -Creatinine was 2.5 on 7/25 -Currently creatinine 2.39 -Upon admission, creatinine 3.36 -Diuretics held, continue to monitor BMP closely  Chronic diastolic CHF  -Vascular congestion but no edema on cxr. BNP 102. He has CHRONIC RLE edema.  -Monitor daily weights, intake/output -Diuretics held due to AKI -continue imdur, norvasc. No ACEI  (CKD)      Chronic RLE edema  -LE dopplers negative for DVT on recent admission.   Essential  hypertension -continue normodyne, norvasc, and catpress, imdur  Hyperkalemia in setting of AKI.  -Resolved -Potassium was 5.6 upon admission.  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 No changes on CTscan.   PVD, s/p fem-pop.  -Continue plavix, asa  Seizure disorder -? Secondary to brain mass.  -continue home meds keppra and clobazam -seizure precautions -Received denial from [employer].  I called and spoke with 4 different representatives and tried to expedite an appeal.  Still waiting for a response.  (this is after several other physicians have faxed back information to [facility], last was 7/28 by Dr. Wynelle Cleveland) -Spoke with oncall neurologist, Dr. Shon Hale, who recommended continuing current regimen, keppra and clobazam and not to start new medications as this could cause problems.  GERD -continue PPI  DVT Prophylaxis  SCDs  Code Status: Full  Family Communication: None at bedside  Disposition Plan: Admitted for observation. Will try to have patient placed in assisted living vs SNF. Likely discharge 8/1  Consultants Dr. Shon Hale, neurology, via phone   Procedures  None  Antibiotics   Anti-infectives    None      Subjective:   Paul Mullins seen and examined today. Complains of his right leg pain. He denies chest pain, shortness of breath, abdominal pain, nausea, vomiting, dizziness, headache.  Objective:   Vitals:   11/15/15 2105 11/15/15 2201 11/16/15 0446 11/16/15 1144  BP: 132/65 (!) 117/58 (!) 144/57 (!) 131/59  Pulse: 78 79 79 73  Resp:  18 19 18   Temp:  98.1 F (36.7 C) 97.8 F (36.6 C) 97.4 F (36.3 C)  TempSrc:  Oral Oral Oral  SpO2:  95% 98% 100%  Weight:   86.4 kg (190 lb 8 oz)   Height:        Intake/Output Summary (Last 24 hours) at 11/16/15 1651 Last data filed at 11/16/15 1434  Gross per 24 hour  Intake             1219 ml  Output              1951 ml  Net             -732 ml   Filed Weights   11/14/15 0525 11/15/15 0551 11/16/15 0446  Weight: 82.4 kg (181 lb 9.6 oz) 84.8 kg (186 lb 14.4 oz) 86.4 kg (190 lb 8 oz)    Exam  General: Well developed, well nourished, chronically ill, NAD  HEENT: NCAT,  mucous membranes moist.   Cardiovascular: S1 S2 auscultated, no murmurs, RRR  Respiratory: Diminished but clear, equal chest rise  Abdomen: Soft, nontender, nondistended, + bowel sounds  Extremities: warm dry without cyanosis clubbing. +RLE edema > LLE  Neuro: AAOx3, mildly slurred speech, otherwise nonfocal  Skin: Without rashes exudates or nodules  Psych: Normal affect and demeanor with intact judgement and insight   Data Reviewed: I have personally reviewed following labs and imaging studies  CBC:  Recent Labs Lab 11/13/15 1021 11/14/15 0225  WBC 8.9 6.5  NEUTROABS 5.0  --   HGB 9.7* 9.5*  HCT 30.7* 30.6*  MCV 94.8 96.5  PLT 279 [id number]   Basic Metabolic Panel:  Recent Labs Lab 11/10/15 0950 11/11/15 0522 11/13/15 1021 11/14/15 0225 11/15/15 0330 11/16/15 0347  NA 136  --  135 138 138 137  K 5.0  --  5.6* 4.3 4.4 4.5  CL 104  --  105 106 106 107  CO2 25  --  21* 23 24 22   GLUCOSE 131*  --  108* 112* 100* 101*  BUN 44*  --  63* 53* 48* 45*  CREATININE 2.69* 2.50* 3.36* 2.84* 2.54* 2.39*  CALCIUM 8.7*  --  9.1 8.9 8.8* 8.8*   GFR: Estimated Creatinine Clearance: 31.3 mL/min (by C-G formula based on SCr of 2.39 mg/dL). Liver Function Tests:  Recent Labs Lab 11/13/15 1021  AST 29  ALT 24  ALKPHOS 78  BILITOT 0.4  PROT 8.4*  ALBUMIN 3.5   No results for input(s): LIPASE, AMYLASE in the last 168 hours. No results for input(s): AMMONIA in the last 168 hours. Coagulation Profile: No results for input(s): INR, PROTIME in the last 168 hours. Cardiac Enzymes: No results for input(s): CKTOTAL, CKMB, CKMBINDEX, TROPONINI in the last 168 hours. BNP (last 3 results) No results for input(s):  PROBNP in the last 8760 hours. HbA1C: No results for input(s): HGBA1C in the last 72 hours. CBG:  Recent Labs Lab 11/13/15 2201  GLUCAP 129*   Lipid Profile: No results for input(s): CHOL, HDL, LDLCALC, TRIG, CHOLHDL, LDLDIRECT in the last 72 hours. Thyroid Function Tests: No results for input(s): TSH, T4TOTAL, FREET4, T3FREE, THYROIDAB in the last 72 hours. Anemia Panel: No results for input(s): VITAMINB12, FOLATE, FERRITIN, TIBC, IRON, RETICCTPCT in the last 72 hours. Urine analysis:    Component Value Date/Time   COLORURINE YELLOW 11/13/2015 [place] 11/13/2015 1133   LABSPEC 1.015 11/13/2015 1133   PHURINE 5.5 11/13/2015 1133   GLUCOSEU NEGATIVE 11/13/2015 1133   HGBUR NEGATIVE 11/13/2015 1133   BILIRUBINUR NEGATIVE 11/13/2015 1133   KETONESUR NEGATIVE 11/13/2015 1133  PROTEINUR NEGATIVE 11/13/2015 1133   UROBILINOGEN 0.2 08/08/2012 1258   NITRITE NEGATIVE 11/13/2015 1133   LEUKOCYTESUR NEGATIVE 11/13/2015 1133   Sepsis Labs: @LABRCNTIP (procalcitonin:4,lacticidven:4)  ) No results found for this or any previous visit (from the past 240 hour(s)).    Radiology Studies: No results found.   Scheduled Meds: . amLODipine  10 mg Oral Daily  . aspirin EC  81 mg Oral Daily  . atorvastatin  40 mg Oral q1800  . cloBAZam  20 mg Oral BID  . cloNIDine  0.2 mg Oral BID  . clopidogrel  75 mg Oral QPM  . gabapentin  300 mg Oral TID  . isosorbide mononitrate  90 mg Oral Daily  . labetalol  200 mg Oral BID  . levETIRAcetam  750 mg Oral BID  . pantoprazole  40 mg Oral Daily   Continuous Infusions:    LOS: 0 days   Time Spent in minutes   30 minutes  Lillyahna Hemberger D.O. on 11/16/2015 at 4:51 PM  Between 7am to 7pm - Pager - [phone]  After 7pm go to [email or web address] - password [email or web address]  And look for the night coverage person covering for me after hours  [facility] Office  [phone]

## 2015-11-16 NOTE — Care Management Obs Status (Deleted)
[facility] NOTIFICATION   Patient Details  Name: Paul Mullins MRN: [id number] Date of Birth: July 07, 1950   Medicare Observation Status Notification Given:       Royston Bake, RN 11/16/2015, 11:14 AM

## 2015-11-16 NOTE — Care Management Note (Signed)
Case Management Note  Patient Details  Name: Paul Mullins MRN: [id number] Date of Birth: 01-26-1951  Subjective/Objective:          Admitted with AKI          Action/Plan: Patient states that he lives at home with his Carlena Sax; PCP - he is active with [facility]; he was seeing Dr Gwenlyn Perking but she left but they will continue to see him for primary care per Miquel Dunn and he will possibly be seen by Dr Dorice Lamas; He has private insurance with [employer]; pharmacy of choice is [facility];  Soc Worker following for placement  Expected Discharge Date:    possibly 11/16/2015              Expected Discharge Plan:  Skilled Nursing Facility  In-House Referral:  Clinical Social Work  Discharge planning Services  CM Consult  Status of Service:  In process, will continue to follow  Sherrilyn Rist [id number] 11/16/2015, 10:52 AM

## 2015-11-16 NOTE — Discharge Instructions (Signed)
Fall Prevention in the Home  Falls can cause injuries and can affect people from all age groups. There are many simple things that you can do to make your home safe and to help prevent falls. WHAT CAN I DO ON THE OUTSIDE OF MY HOME?  Regularly repair the edges of walkways and driveways and fix any cracks.  Remove high doorway thresholds.  Trim any shrubbery on the main path into your home.  Use bright outdoor lighting.  Clear walkways of debris and clutter, including tools and rocks.  Regularly check that handrails are securely fastened and in good repair. Both sides of any steps should have handrails.  Install guardrails along the edges of any raised decks or porches.  Have leaves, snow, and ice cleared regularly.  Use sand or salt on walkways during winter months.  In the garage, clean up any spills right away, including grease or oil spills. WHAT CAN I DO IN THE BATHROOM?  Use night lights.  Install grab bars by the toilet and in the tub and shower. Do not use towel bars as grab bars.  Use non-skid mats or decals on the floor of the tub or shower.  If you need to sit down while you are in the shower, use a plastic, non-slip stool..  Keep the floor dry. Immediately clean up any water that spills on the floor.  Remove soap buildup in the tub or shower on a regular basis.  Attach bath mats securely with double-sided non-slip rug tape.  Remove throw rugs and other tripping hazards from the floor. WHAT CAN I DO IN THE BEDROOM?  Use night lights.  Make sure that a bedside light is easy to reach.  Do not use oversized bedding that drapes onto the floor.  Have a firm chair that has side arms to use for getting dressed.  Remove throw rugs and other tripping hazards from the floor. WHAT CAN I DO IN THE KITCHEN?   Clean up any spills right away.  Avoid walking on wet floors.  Place frequently used items in easy-to-reach places.  If you need to reach for something  above you, use a sturdy step stool that has a grab bar.  Keep electrical cables out of the way.  Do not use floor polish or wax that makes floors slippery. If you have to use wax, make sure that it is non-skid floor wax.  Remove throw rugs and other tripping hazards from the floor. WHAT CAN I DO IN THE STAIRWAYS?  Do not leave any items on the stairs.  Make sure that there are handrails on both sides of the stairs. Fix handrails that are broken or loose. Make sure that handrails are as long as the stairways.  Check any carpeting to make sure that it is firmly attached to the stairs. Fix any carpet that is loose or worn.  Avoid having throw rugs at the top or bottom of stairways, or secure the rugs with carpet tape to prevent them from moving.  Make sure that you have a light switch at the top of the stairs and the bottom of the stairs. If you do not have them, have them installed. WHAT ARE SOME OTHER FALL PREVENTION TIPS?  Wear closed-toe shoes that fit well and support your feet. Wear shoes that have rubber soles or low heels.  When you use a stepladder, make sure that it is completely opened and that the sides are firmly locked. Have someone hold the ladder while you   are using it. Do not climb a closed stepladder.  Add color or contrast paint or tape to grab bars and handrails in your home. Place contrasting color strips on the first and last steps.  Use mobility aids as needed, such as canes, walkers, scooters, and crutches.  Turn on lights if it is dark. Replace any light bulbs that burn out.  Set up furniture so that there are clear paths. Keep the furniture in the same spot.  Fix any uneven floor surfaces.  Choose a carpet design that does not hide the edge of steps of a stairway.  Be aware of any and all pets.  Review your medicines with your healthcare provider. Some medicines can cause dizziness or changes in blood pressure, which increase your risk of falling. Talk  with your health care provider about other ways that you can decrease your risk of falls. This may include working with a physical therapist or trainer to improve your strength, balance, and endurance.   This information is not intended to replace advice given to you by your health care provider. Make sure you discuss any questions you have with your health care provider.   Document Released: 03/25/2002 Document Revised: 08/19/2014 Document Reviewed: 05/09/2014 Elsevier Interactive Patient Education 2016 Elsevier Inc.  

## 2015-11-16 NOTE — Care Management Obs Status (Signed)
[facility] NOTIFICATION   Patient Details  Name: Oriel Klaus MRN: [id number] Date of Birth: 1950-04-30   Medicare Observation Status Notification Given:  Yes    Royston Bake, RN 11/16/2015, 11:14 AM

## 2015-11-16 NOTE — Progress Notes (Signed)
RE: Benefit check for Vimpat   Memory Argue CMA        S/W [facility] @ [facility] # [phone]   VIMPAT 100 MG BID ( 30 )   COVER- YES  CO-PAY- $ 8.25   60 TAB  TIER- 4 DRUG  PRIOR APPROVAL _ YES # [phone]  PHARMACY : [id number] AND [facility]

## 2015-11-16 NOTE — Clinical Social Work Note (Addendum)
CSW contacted admissions coordinator at [facility], Colletta Maryland [phone]) regarding referral. She is reviewing referral and will come to hospital for referral today. Patient's location given. Colletta Maryland will also call patient's nephew, Kyung Rudd. Colletta Maryland made aware that patient ready for discharge today.  [name], [place] [phone]  2:13 pm CSW called and left voicemail for Colletta Maryland to see if assessment had been completed yet. DO stated that if not, she will discharge him home today and he can go to ALF once their intake process is complete.  [name], [place] [phone]  2:53 pm CSW called and left a message with front desk staff for [place].   [name], [place]

## 2015-11-17 DIAGNOSIS — G939 Disorder of brain, unspecified: Secondary | ICD-10-CM | POA: Diagnosis not present

## 2015-11-17 DIAGNOSIS — I5032 Chronic diastolic (congestive) heart failure: Secondary | ICD-10-CM | POA: Diagnosis not present

## 2015-11-17 DIAGNOSIS — N179 Acute kidney failure, unspecified: Secondary | ICD-10-CM | POA: Diagnosis not present

## 2015-11-17 DIAGNOSIS — W19XXXA Unspecified fall, initial encounter: Secondary | ICD-10-CM | POA: Diagnosis not present

## 2015-11-17 NOTE — Progress Notes (Signed)
Pt c/o leg pain, Tylenol given, tol well and he is resting quietly, will continue to monitor.

## 2015-11-17 NOTE — Progress Notes (Signed)
Per Leodis Sias, pt will have the determination for SNF placement within 48hrs; pt is to be discharged home with [facility] services provided by [facility] as prior to admission. Butch Penny with [facility] made aware; Mindi Slicker [facility] [phone]

## 2015-11-17 NOTE — Clinical Social Work Note (Addendum)
Maximino Sarin at [facility] stated that she was too busy to come to [facility] for assessment yesterday but plans to do so today. Placement will depend on how far patient is in [place] process. Ms. Mateo Flow asked CSW to get in touch with patient's nephew and have him call her. She has been trying to get in touch with him since Saturday.  [name], CSW [phone]  11:10 am CSW spoke with patient's nephew who said they would prefer patient returning to [facility] rather than going to [facility] ALF. CSW discussed insurance denying last authorization. CSW will attempt again. CSW in contact with Abigail Butts at [employer]. She will attempt insurance authorization again. Patient last seen by PT on 7/29 and was walking 125 feet with minimal guard.  [name], [place] [phone]  2:21 pm [facility] has started insurance authorization. It will take up to 2 days to get answer on approval or denial. Per another CSW, Medicare allows up to 30 days for answers so patient can discharge home with his nephew while he waits. Nephew and patient aware and are agreeable to plan. RNCM starting Home Health in case he is denied for SNF. CSW stressed safety while at home, using his walker, etc. Patient expressed understanding.  CSW signing off. Patient will discharge home with nephew today. His nephew gets off work at 4:00 and will pick him up after that.  [name], [place]

## 2015-11-17 NOTE — Progress Notes (Signed)
Occupational Therapy Treatment Patient Details Name: Paul Mullins MRN: [id number] DOB: 09/23/1950 Today's Date: 11/17/2015    History of present illness Paul Mullins is a 65 y.o. male with a medical history significant for, but not  limited to, HTN, chronic diastolic heart failure, CKD, and seizures. Patient was discharged from the hospital late June after being hospitalized for weakness, confusion, right lower extremity weakness with fall. MRI suggested a brain tumor. Patient was rehospitalized 11/04/15 with worsening lower extremity edema. He was ultimately discharged home instead of SNF ( ? Financial reasons) on 11/11/15.   OT comments  Pt making progress with functional goals. Pt states that he will d/c to [facility] today or tomorrow. OT will continue to follow  Follow Up Recommendations  SNF;Supervision/Assistance - 24 hour    Equipment Recommendations       Recommendations for Other Services      Precautions / Restrictions Precautions Precautions: Fall Precaution Comments: seizures Restrictions Weight Bearing Restrictions: No       Mobility Bed Mobility               General bed mobility comments: pt up in recliner  Transfers Overall transfer level: Needs assistance Equipment used: Rolling walker (2 wheeled) Transfers: Sit to/from Stand Sit to Stand: Min guard         General transfer comment: Min guard for safety. VC for hand placement and walker use upon standing.    Balance Overall balance assessment: Needs assistance   Sitting balance-Leahy Scale: Good     Standing balance support: During functional activity Standing balance-Leahy Scale: Fair                     ADL       Grooming: Wash/dry hands;Min guard;Standing;Wash/dry face       Lower Body Bathing: Minimal assistance;Sit to/from stand;Min guard       Lower Body Dressing: Minimal assistance;Sit to/from stand   Toilet Transfer: Min guard;RW;Ambulation;Comfort height  toilet;Grab bars   Toileting- Water quality scientist and Hygiene: Sit to/from stand;Min guard       Functional mobility during ADLs: Min guard;Rolling walker;Cueing for safety        Vision  no change from baseline                              Cognition   Behavior During Therapy: [facility] for tasks assessed/performed Overall Cognitive Status: No family/caregiver present to determine baseline cognitive functioning Area of Impairment: Safety/judgement          Safety/Judgement: Decreased awareness of safety;Decreased awareness of deficits          Extremity/Trunk Assessment   generalized weakness                        General Comments  pt pleasant and cooperative    Pertinent Vitals/ Pain       Pain Assessment: No/denies pain  Home Living     lives with nephew                                                    Frequency Min 2X/week     Progress Toward Goals  OT Goals(current goals can now be found in the care plan section)  Progress towards  OT goals: Progressing toward goals     Plan Discharge plan remains appropriate                     End of Session Equipment Utilized During Treatment: Gait belt;Rolling walker   Activity Tolerance Patient tolerated treatment well   Patient Left in chair;with call bell/phone within reach;with chair alarm set         Functional Assessment Tool Used: clinical judgement Functional Limitation: Self care Self Care Current Status [id number]): At least 1 percent but less than 20 percent impaired, limited or restricted Self Care Goal Status [id number]): At least 1 percent but less than 20 percent impaired, limited or restricted   Time: [id number] OT Time Calculation (min): 17 min  Charges: OT G-codes **NOT FOR INPATIENT CLASS** Functional Assessment Tool Used: clinical judgement Functional Limitation: Self care Self Care Current Status [id number]): At least 1 percent but less than  20 percent impaired, limited or restricted Self Care Goal Status [id number]): At least 1 percent but less than 20 percent impaired, limited or restricted OT General Charges $OT Visit: 1 Procedure OT Treatments $Self Care/Home Management : 8-22 mins  Britt Bottom 11/17/2015, 1:28 PM

## 2015-11-17 NOTE — NC FL2 (Signed)
[place] LEVEL OF CARE SCREENING TOOL     IDENTIFICATION  Patient Name: Paul Mullins Birthdate: 04/14/1951 Sex: male Admission Date (Current Location): 11/13/2015  [facility] and [place] Number:  Herbalist and Address:  [facility] [place] [place] [address], [place], [place] [address]      Provider Number: [id number]  Attending Physician Name and Address:  Cristal Ford, DO  Relative Name and Phone Number:       Current Level of Care: Hospital Recommended Level of Care: [place] Prior Approval Number:    Date Approved/Denied:   PASRR Number: [id number]  Discharge Plan: SNF    Current Diagnoses: Patient Active Problem List   Diagnosis Date Noted  . Hyperkalemia 11/13/2015  . Fall   . AKI (acute kidney injury) ([place])   . Bilateral leg edema   . Brain mass   . CRI (chronic renal insufficiency)   . Leg swelling   . Normocytic anemia 11/04/2015  . Acute-on-chronic kidney injury ([place]) 11/04/2015  . Seizure disorder ([place])   . Chronic diastolic (congestive) heart failure ([place])   . Coronary artery disease involving native coronary artery of native heart without angina pectoris   . Neoplasm   . Essential hypertension 10/11/2015  . CKD (chronic kidney disease) 10/11/2015  . 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 PVD (peripheral vascular disease) ([place]) 08/07/2012    Orientation RESPIRATION BLADDER Height & Weight     Self, Situation, Place, Time  Normal Incontinent, External catheter Weight: 189 lb 8 oz (86 kg) (scale b) Height:  5\' 6"  (167.6 cm)  BEHAVIORAL SYMPTOMS/MOOD NEUROLOGICAL BOWEL NUTRITION STATUS   (None) Convulsions/Seizures Continent    AMBULATORY STATUS COMMUNICATION OF NEEDS Skin   Limited Assist Verbally Skin abrasions, Other (Comment) ([place])                       Personal Care Assistance Level of Assistance  Bathing, Feeding, Dressing Bathing  Assistance: Limited assistance Feeding assistance: Independent Dressing Assistance: Limited assistance     Functional Limitations Info  Hearing, Speech, Sight Sight Info: Adequate Hearing Info: Adequate Speech Info: Adequate    SPECIAL CARE FACTORS FREQUENCY  PT (By licensed PT), OT (By licensed OT), Blood pressure     PT Frequency: 5 x week OT Frequency: 5 x week            Contractures Contractures Info: Not present    Additional Factors Info  Code Status, Allergies Code Status Info: Full Allergies Info: Ace Inhibitors           Current Medications (11/17/2015):  This is the current hospital active medication list Current Facility-Administered Medications  Medication Dose Route Frequency Provider Last Rate Last Dose  . acetaminophen (TYLENOL) tablet 650 mg  650 mg Oral Q6H PRN Maryann Mikhail, DO   650 mg at 11/17/15 0038  . amLODipine (NORVASC) tablet 10 mg  10 mg Oral Daily Willia Craze, NP   10 mg at 11/17/15 0953  . aspirin EC tablet 81 mg  81 mg Oral Daily Willia Craze, NP   81 mg at 11/17/15 0953  . atorvastatin (LIPITOR) tablet 40 mg  40 mg Oral q1800 Albertine Patricia, MD   40 mg at 11/16/15 1715  . bisacodyl (DULCOLAX) EC tablet 5 mg  5 mg Oral Daily PRN Willia Craze, NP      . cloBAZam (ONFI) tablet 20 mg  20 mg Oral BID Willia Craze, NP   20 mg at 11/17/15 [id number]  . cloNIDine (CATAPRES) tablet 0.2 mg  0.2 mg Oral BID Willia Craze, NP   0.2 mg at 11/17/15 0953  . clopidogrel (PLAVIX) tablet 75 mg  75 mg Oral QPM Willia Craze, NP   75 mg at 11/16/15 1715  . diphenhydrAMINE (BENADRYL) capsule 25 mg  25 mg Oral Q6H PRN Ritta Slot, NP   25 mg at 11/14/15 2036  . diphenhydrAMINE-zinc acetate (BENADRYL) 2-0.1 % cream   Topical TID PRN Maryann Mikhail, DO      . gabapentin (NEURONTIN) capsule 300 mg  300 mg Oral TID Willia Craze, NP   300 mg at 11/17/15 0953  . isosorbide mononitrate (IMDUR) 24 hr tablet 90 mg  90 mg Oral Daily Willia Craze, NP   90 mg at 11/17/15 [id number]  . labetalol (NORMODYNE) tablet 200 mg  200 mg Oral BID Willia Craze, NP   200 mg at 11/17/15 0953  . levETIRAcetam (KEPPRA) tablet 750 mg  750 mg Oral BID Willia Craze, NP   750 mg at 11/17/15 0953  . nitroGLYCERIN (NITROSTAT) SL tablet 0.4 mg  0.4 mg Sublingual Q5 min PRN Willia Craze, NP      . ondansetron [facility]) tablet 4 mg  4 mg Oral Q6H PRN Willia Craze, NP       Or  . ondansetron [facility]) injection 4 mg  4 mg Intravenous Q6H PRN Willia Craze, NP      . pantoprazole (PROTONIX) EC tablet 40 mg  40 mg Oral Daily Willia Craze, NP   40 mg at 11/17/15 0953  . polyethylene glycol (MIRALAX / GLYCOLAX) packet 17 g  17 g Oral Daily PRN Willia Craze, NP      . traZODone (DESYREL) tablet 25 mg  25 mg Oral QHS PRN Willia Craze, NP         Discharge Medications: Please see discharge summary for a list of discharge medications.  Relevant Imaging Results:  Relevant Lab Results:   Additional Information SS#: [id number]  Candie Chroman, LCSW

## 2015-11-17 NOTE — Progress Notes (Signed)
Discharge instructions given to patient and family and all questions answered.  Patient discharged via wheelchair with all belongings.

## 2015-11-17 NOTE — Progress Notes (Signed)
Pt had a nose bleed this morning, no oxygen in place, he stated this is the second time he's had a nose bleed.

## 2015-11-17 NOTE — Progress Notes (Signed)
Physical Therapy Treatment Patient Details Name: Paul Mullins MRN: [id number] DOB: 05/05/1950 Today's Date: 11/17/2015    History of Present Illness Paul Mullins is a 65 y.o. male with a medical history significant for, but not  limited to, HTN, chronic diastolic heart failure, CKD, and seizures. Patient was discharged from the hospital late June after being hospitalized for weakness, confusion, right lower extremity weakness with fall. MRI suggested a brain tumor. Patient was rehospitalized 11/04/15 with worsening lower extremity edema. He was ultimately discharged home instead of SNF ( ? Financial reasons) on 11/11/15.    PT Comments    Paul Mullins requires cues for safe technique using RW during transfers and while ambulating.  He says someone will be with him at home 24/7 until he is transferred to SNF.  Emphasized to pt that he needs to have someone with him when OOB or chair at home, pt verbalized understanding.     Follow Up Recommendations  SNF     Equipment Recommendations  None recommended by PT    Recommendations for Other Services OT consult     Precautions / Restrictions Precautions Precautions: Fall Precaution Comments: seizures Restrictions Weight Bearing Restrictions: No    Mobility  Bed Mobility               General bed mobility comments: In recliner upon PT arrival  Transfers Overall transfer level: Needs assistance Equipment used: Rolling walker (2 wheeled) Transfers: Sit to/from Stand Sit to Stand: Min guard         General transfer comment: Min guard for safety. VC for hand placement and walker use upon standing.  Ambulation/Gait Ambulation/Gait assistance: Min guard Ambulation Distance (Feet): 125 Feet Assistive device: Rolling walker (2 wheeled) Gait Pattern/deviations: Step-through pattern;Decreased stride length;Trunk flexed Gait velocity: decreased   General Gait Details: Practiced turning with RW but pt continues to require cues  to remain within walker. Cues for upright posture and relaxing shoulders.     Stairs            Wheelchair Mobility    Modified Rankin (Stroke Patients Only)       Balance Overall balance assessment: Needs assistance Sitting-balance support: No upper extremity supported;Feet supported Sitting balance-Leahy Scale: Good     Standing balance support: No upper extremity supported;During functional activity Standing balance-Leahy Scale: Fair                      Cognition Arousal/Alertness: Awake/alert Behavior During Therapy: WFL for tasks assessed/performed Overall Cognitive Status: No family/caregiver present to determine baseline cognitive functioning (Oriented to x3, questionable hx) Area of Impairment: Safety/judgement         Safety/Judgement: Decreased awareness of safety;Decreased awareness of deficits          Exercises Other Exercises Other Exercises: Sit<>stand x6 with cues for hand placement and safe technique    General Comments        Pertinent Vitals/Pain Pain Assessment: No/denies pain Pain Intervention(s): Limited activity within patient's tolerance;Monitored during session    Home Living                      Prior Function            PT Goals (current goals can now be found in the care plan section) Acute Rehab PT Goals Patient Stated Goal: To regain independence PT Goal Formulation: With patient Time For Goal Achievement: 11/28/15 Potential to Achieve Goals: Good Progress towards PT goals: Progressing toward  goals    Frequency  Min 2X/week    PT Plan Current plan remains appropriate    Co-evaluation             End of Session Equipment Utilized During Treatment: Gait belt Activity Tolerance: Patient tolerated treatment well Patient left: in chair;with call bell/phone within reach;with chair alarm set     Time: [id number] PT Time Calculation (min) (ACUTE ONLY): 17 min  Charges:  $Gait Training:  8-22 mins                    G Codes:       Collie Siad PT, DPT  Pager: [phone] Phone: [phone] 11/17/2015, 3:59 PM

## 2015-12-13 ENCOUNTER — Emergency Department (HOSPITAL_COMMUNITY): Payer: Medicare HMO

## 2015-12-13 ENCOUNTER — Inpatient Hospital Stay (HOSPITAL_COMMUNITY)
Admission: EM | Admit: 2015-12-13 | Discharge: 2015-12-18 | DRG: 296 | Disposition: E | Payer: Medicare HMO | Attending: Pulmonary Disease | Admitting: Pulmonary Disease

## 2015-12-13 ENCOUNTER — Encounter (HOSPITAL_COMMUNITY): Payer: Self-pay | Admitting: *Deleted

## 2015-12-13 DIAGNOSIS — Z95828 Presence of other vascular implants and grafts: Secondary | ICD-10-CM | POA: Diagnosis not present

## 2015-12-13 DIAGNOSIS — I130 Hypertensive heart and chronic kidney disease with heart failure and stage 1 through stage 4 chronic kidney disease, or unspecified chronic kidney disease: Secondary | ICD-10-CM | POA: Diagnosis present

## 2015-12-13 DIAGNOSIS — Z888 Allergy status to other drugs, medicaments and biological substances status: Secondary | ICD-10-CM | POA: Diagnosis not present

## 2015-12-13 DIAGNOSIS — E875 Hyperkalemia: Secondary | ICD-10-CM | POA: Diagnosis present

## 2015-12-13 DIAGNOSIS — N183 Chronic kidney disease, stage 3 (moderate): Secondary | ICD-10-CM | POA: Diagnosis present

## 2015-12-13 DIAGNOSIS — I469 Cardiac arrest, cause unspecified: Secondary | ICD-10-CM | POA: Diagnosis present

## 2015-12-13 DIAGNOSIS — Z66 Do not resuscitate: Secondary | ICD-10-CM | POA: Diagnosis not present

## 2015-12-13 DIAGNOSIS — J80 Acute respiratory distress syndrome: Secondary | ICD-10-CM | POA: Diagnosis present

## 2015-12-13 DIAGNOSIS — IMO0002 Reserved for concepts with insufficient information to code with codable children: Secondary | ICD-10-CM

## 2015-12-13 DIAGNOSIS — Z79899 Other long term (current) drug therapy: Secondary | ICD-10-CM

## 2015-12-13 DIAGNOSIS — I2510 Atherosclerotic heart disease of native coronary artery without angina pectoris: Secondary | ICD-10-CM | POA: Diagnosis present

## 2015-12-13 DIAGNOSIS — Z7902 Long term (current) use of antithrombotics/antiplatelets: Secondary | ICD-10-CM | POA: Diagnosis not present

## 2015-12-13 DIAGNOSIS — R229 Localized swelling, mass and lump, unspecified: Secondary | ICD-10-CM

## 2015-12-13 DIAGNOSIS — G939 Disorder of brain, unspecified: Secondary | ICD-10-CM | POA: Diagnosis present

## 2015-12-13 DIAGNOSIS — G40909 Epilepsy, unspecified, not intractable, without status epilepticus: Secondary | ICD-10-CM | POA: Diagnosis present

## 2015-12-13 DIAGNOSIS — I739 Peripheral vascular disease, unspecified: Secondary | ICD-10-CM | POA: Diagnosis present

## 2015-12-13 DIAGNOSIS — Z87891 Personal history of nicotine dependence: Secondary | ICD-10-CM

## 2015-12-13 DIAGNOSIS — N170 Acute kidney failure with tubular necrosis: Secondary | ICD-10-CM | POA: Diagnosis present

## 2015-12-13 DIAGNOSIS — G253 Myoclonus: Secondary | ICD-10-CM | POA: Diagnosis present

## 2015-12-13 DIAGNOSIS — Z515 Encounter for palliative care: Secondary | ICD-10-CM | POA: Diagnosis present

## 2015-12-13 DIAGNOSIS — Z7982 Long term (current) use of aspirin: Secondary | ICD-10-CM | POA: Diagnosis not present

## 2015-12-13 DIAGNOSIS — K219 Gastro-esophageal reflux disease without esophagitis: Secondary | ICD-10-CM | POA: Diagnosis present

## 2015-12-13 DIAGNOSIS — E872 Acidosis: Secondary | ICD-10-CM | POA: Diagnosis present

## 2015-12-13 DIAGNOSIS — I5043 Acute on chronic combined systolic (congestive) and diastolic (congestive) heart failure: Secondary | ICD-10-CM | POA: Diagnosis present

## 2015-12-13 DIAGNOSIS — Q210 Ventricular septal defect: Secondary | ICD-10-CM

## 2015-12-13 DIAGNOSIS — J189 Pneumonia, unspecified organism: Secondary | ICD-10-CM | POA: Diagnosis present

## 2015-12-13 HISTORY — DX: Disorder of kidney and ureter, unspecified: N28.9

## 2015-12-13 HISTORY — DX: Gastro-esophageal reflux disease without esophagitis: K21.9

## 2015-12-13 HISTORY — DX: Hyperkalemia: E87.5

## 2015-12-13 HISTORY — DX: Unspecified convulsions: R56.9

## 2015-12-13 HISTORY — DX: Other specified disorders of brain: G93.89

## 2015-12-13 LAB — BRAIN NATRIURETIC PEPTIDE: B Natriuretic Peptide: 131.3 pg/mL — ABNORMAL HIGH (ref 0.0–100.0)

## 2015-12-13 LAB — BASIC METABOLIC PANEL
Anion gap: 7 (ref 5–15)
BUN: 32 mg/dL — AB (ref 6–20)
CO2: 22 mmol/L (ref 22–32)
Calcium: 7.7 mg/dL — ABNORMAL LOW (ref 8.9–10.3)
Chloride: 114 mmol/L — ABNORMAL HIGH (ref 101–111)
Creatinine, Ser: 2.83 mg/dL — ABNORMAL HIGH (ref 0.61–1.24)
GFR calc Af Amer: 25 mL/min — ABNORMAL LOW (ref >60)
GFR, EST NON AFRICAN AMERICAN: 22 mL/min — AB (ref >60)
Glucose, Bld: 77 mg/dL (ref 65–99)
POTASSIUM: 5.6 mmol/L — AB (ref 3.5–5.1)
Sodium: 143 mmol/L (ref 135–145)

## 2015-12-13 LAB — I-STAT ARTERIAL BLOOD GAS, ED
Acid-base deficit: 8 mmol/L — ABNORMAL HIGH (ref 0.0–2.0)
Acid-base deficit: 8 mmol/L — ABNORMAL HIGH (ref 0.0–2.0)
Bicarbonate: 20.7 mEq/L (ref 20.0–24.0)
Bicarbonate: 20.9 mEq/L (ref 20.0–24.0)
O2 SAT: 89 %
O2 Saturation: 88 %
PCO2 ART: 57.4 mmHg — AB (ref 35.0–45.0)
PCO2 ART: 59.4 mmHg — AB (ref 35.0–45.0)
PH ART: 7.161 — AB (ref 7.350–7.450)
PH ART: 7.149 — AB (ref 7.350–7.450)
PO2 ART: 67 mmHg — AB (ref 80.0–100.0)
Patient temperature: 97
Patient temperature: 97
TCO2: 23 mmol/L (ref 0–100)
TCO2: 23 mmol/L (ref 0–100)
pO2, Arterial: 70 mmHg — ABNORMAL LOW (ref 80.0–100.0)

## 2015-12-13 LAB — CBC WITH DIFFERENTIAL/PLATELET
Basophils Absolute: 0 10*3/uL (ref 0.0–0.1)
Basophils Relative: 1 %
EOS PCT: 12 %
Eosinophils Absolute: 0.9 10*3/uL — ABNORMAL HIGH (ref 0.0–0.7)
HEMATOCRIT: 33.1 % — AB (ref 39.0–52.0)
HEMOGLOBIN: 9.4 g/dL — AB (ref 13.0–17.0)
Lymphocytes Relative: 32 %
Lymphs Abs: 2.4 10*3/uL (ref 0.7–4.0)
MCH: 28.2 pg (ref 26.0–34.0)
MCHC: 28.4 g/dL — ABNORMAL LOW (ref 30.0–36.0)
MCV: 99.4 fL (ref 78.0–100.0)
Monocytes Absolute: 0.4 10*3/uL (ref 0.1–1.0)
Monocytes Relative: 6 %
NEUTROS PCT: 49 %
Neutro Abs: 3.7 10*3/uL (ref 1.7–7.7)
Platelets: 195 10*3/uL (ref 150–400)
RBC: 3.33 MIL/uL — AB (ref 4.22–5.81)
RDW: 15.6 % — ABNORMAL HIGH (ref 11.5–15.5)
WBC: 7.5 10*3/uL (ref 4.0–10.5)

## 2015-12-13 LAB — CBC
HEMATOCRIT: 37.8 % — AB (ref 39.0–52.0)
HEMOGLOBIN: 10.6 g/dL — AB (ref 13.0–17.0)
MCH: 27.7 pg (ref 26.0–34.0)
MCHC: 28 g/dL — AB (ref 30.0–36.0)
MCV: 99 fL (ref 78.0–100.0)
Platelets: 196 10*3/uL (ref 150–400)
RBC: 3.82 MIL/uL — ABNORMAL LOW (ref 4.22–5.81)
RDW: 15.7 % — ABNORMAL HIGH (ref 11.5–15.5)
WBC: 5.4 10*3/uL (ref 4.0–10.5)

## 2015-12-13 LAB — MAGNESIUM: MAGNESIUM: 1.7 mg/dL (ref 1.7–2.4)

## 2015-12-13 LAB — BLOOD GAS, ARTERIAL
Acid-base deficit: 5.5 mmol/L — ABNORMAL HIGH (ref 0.0–2.0)
BICARBONATE: 21.4 meq/L (ref 20.0–24.0)
Drawn by: 347671
FIO2: 50
O2 Saturation: 50 %
PATIENT TEMPERATURE: 98.6
PCO2 ART: 58.2 mmHg — AB (ref 35.0–45.0)
PEEP: 8 cmH2O
PO2 ART: 37.5 mmHg — AB (ref 80.0–100.0)
RATE: 28 resp/min
TCO2: 23.2 mmol/L (ref 0–100)
VT: 400 mL
pH, Arterial: 7.192 — CL (ref 7.350–7.450)

## 2015-12-13 LAB — I-STAT CG4 LACTIC ACID, ED
LACTIC ACID, VENOUS: 6.79 mmol/L — AB (ref 0.5–1.9)
LACTIC ACID, VENOUS: 2.82 mmol/L — AB (ref 0.5–1.9)

## 2015-12-13 LAB — LACTIC ACID, PLASMA: Lactic Acid, Venous: 2.2 mmol/L (ref 0.5–1.9)

## 2015-12-13 LAB — I-STAT CHEM 8, ED
BUN: 34 mg/dL — ABNORMAL HIGH (ref 6–20)
CHLORIDE: 110 mmol/L (ref 101–111)
Calcium, Ion: 1.06 mmol/L — ABNORMAL LOW (ref 1.12–1.23)
Creatinine, Ser: 2.5 mg/dL — ABNORMAL HIGH (ref 0.61–1.24)
Glucose, Bld: 246 mg/dL — ABNORMAL HIGH (ref 65–99)
HEMATOCRIT: 32 % — AB (ref 39.0–52.0)
Hemoglobin: 10.9 g/dL — ABNORMAL LOW (ref 13.0–17.0)
POTASSIUM: 6.2 mmol/L — AB (ref 3.5–5.1)
SODIUM: 143 mmol/L (ref 135–145)
TCO2: 21 mmol/L (ref 0–100)

## 2015-12-13 LAB — TROPONIN I: TROPONIN I: 0.3 ng/mL — AB (ref <0.03)

## 2015-12-13 LAB — COMPREHENSIVE METABOLIC PANEL
ALK PHOS: 88 U/L (ref 38–126)
ALT: 31 U/L (ref 17–63)
AST: 49 U/L — ABNORMAL HIGH (ref 15–41)
Albumin: 2.6 g/dL — ABNORMAL LOW (ref 3.5–5.0)
Anion gap: 10 (ref 5–15)
BILIRUBIN TOTAL: 0.4 mg/dL (ref 0.3–1.2)
BUN: 26 mg/dL — ABNORMAL HIGH (ref 6–20)
CALCIUM: 8.3 mg/dL — AB (ref 8.9–10.3)
CO2: 21 mmol/L — AB (ref 22–32)
CREATININE: 2.73 mg/dL — AB (ref 0.61–1.24)
Chloride: 110 mmol/L (ref 101–111)
GFR, EST AFRICAN AMERICAN: 26 mL/min — AB (ref >60)
GFR, EST NON AFRICAN AMERICAN: 23 mL/min — AB (ref >60)
Glucose, Bld: 252 mg/dL — ABNORMAL HIGH (ref 65–99)
Potassium: 6.2 mmol/L — ABNORMAL HIGH (ref 3.5–5.1)
Sodium: 141 mmol/L (ref 135–145)
TOTAL PROTEIN: 7.1 g/dL (ref 6.5–8.1)

## 2015-12-13 LAB — PHOSPHORUS: PHOSPHORUS: 9 mg/dL — AB (ref 2.5–4.6)

## 2015-12-13 LAB — I-STAT TROPONIN, ED: Troponin i, poc: 0.03 ng/mL (ref 0.00–0.08)

## 2015-12-13 LAB — MRSA PCR SCREENING: MRSA BY PCR: NEGATIVE

## 2015-12-13 MED ORDER — NOREPINEPHRINE BITARTRATE 1 MG/ML IV SOLN
0.0000 ug/min | INTRAVENOUS | Status: DC
  Administered 2015-12-13: 10 ug/min via INTRAVENOUS
  Filled 2015-12-13: qty 4

## 2015-12-13 MED ORDER — PANTOPRAZOLE SODIUM 40 MG PO TBEC
40.0000 mg | DELAYED_RELEASE_TABLET | Freq: Every day | ORAL | Status: DC
  Administered 2015-12-13: 40 mg via ORAL
  Filled 2015-12-13: qty 1

## 2015-12-13 MED ORDER — INSULIN ASPART 100 UNIT/ML IV SOLN
10.0000 [IU] | Freq: Once | INTRAVENOUS | Status: AC
  Administered 2015-12-13: 10 [IU] via INTRAVENOUS
  Filled 2015-12-13: qty 1

## 2015-12-13 MED ORDER — LACOSAMIDE 50 MG PO TABS
200.0000 mg | ORAL_TABLET | Freq: Two times a day (BID) | ORAL | Status: DC
  Administered 2015-12-13: 200 mg via ORAL
  Filled 2015-12-13: qty 4

## 2015-12-13 MED ORDER — SODIUM CHLORIDE 0.9 % IV SOLN
750.0000 mg | Freq: Two times a day (BID) | INTRAVENOUS | Status: DC
  Filled 2015-12-13: qty 7.5

## 2015-12-13 MED ORDER — ENOXAPARIN SODIUM 30 MG/0.3ML ~~LOC~~ SOLN
30.0000 mg | SUBCUTANEOUS | Status: DC
  Administered 2015-12-13: 30 mg via SUBCUTANEOUS
  Filled 2015-12-13: qty 0.3

## 2015-12-13 MED ORDER — ALBUTEROL SULFATE (2.5 MG/3ML) 0.083% IN NEBU
10.0000 mg | INHALATION_SOLUTION | Freq: Once | RESPIRATORY_TRACT | Status: AC
  Administered 2015-12-13: 10 mg via RESPIRATORY_TRACT
  Filled 2015-12-13: qty 12

## 2015-12-13 MED ORDER — SODIUM CHLORIDE 0.9 % IV BOLUS (SEPSIS)
1000.0000 mL | Freq: Once | INTRAVENOUS | Status: AC
  Administered 2015-12-13: 1000 mL via INTRAVENOUS

## 2015-12-13 MED ORDER — ORAL CARE MOUTH RINSE: 15.0000 mL | Freq: Four times a day (QID) | OROMUCOSAL | Status: DC

## 2015-12-13 MED ORDER — DEXTROSE 5 % IV SOLN
0.5000 ug/min | INTRAVENOUS | Status: DC
  Administered 2015-12-13: 10 ug/min via INTRAVENOUS
  Filled 2015-12-13 (×2): qty 4

## 2015-12-13 MED ORDER — LORAZEPAM 2 MG/ML IJ SOLN
1.0000 mg/h | INTRAMUSCULAR | Status: DC
  Administered 2015-12-13: 1 mg/h via INTRAVENOUS
  Filled 2015-12-13: qty 25

## 2015-12-13 MED ORDER — MIDAZOLAM HCL 2 MG/2ML IJ SOLN: 1.0000 mg | INTRAMUSCULAR | Status: DC | PRN

## 2015-12-13 MED ORDER — FUROSEMIDE 10 MG/ML IJ SOLN
60.0000 mg | Freq: Once | INTRAMUSCULAR | Status: AC
  Administered 2015-12-13: 60 mg via INTRAVENOUS
  Filled 2015-12-13: qty 6

## 2015-12-13 MED ORDER — PANTOPRAZOLE SODIUM 40 MG PO PACK: 40.0000 mg | PACK | Freq: Every day | ORAL | Status: DC

## 2015-12-13 MED ORDER — CALCIUM GLUCONATE 10 % IV SOLN
1.0000 g | Freq: Once | INTRAVENOUS | Status: AC
  Administered 2015-12-13: 1 g via INTRAVENOUS
  Filled 2015-12-13: qty 10

## 2015-12-13 MED ORDER — MIDAZOLAM HCL 2 MG/2ML IJ SOLN
INTRAMUSCULAR | Status: AC
  Administered 2015-12-13: 2 mg
  Filled 2015-12-13: qty 2

## 2015-12-13 MED ORDER — SODIUM CHLORIDE 0.9 % IV SOLN
200.0000 mg | Freq: Two times a day (BID) | INTRAVENOUS | Status: DC
  Filled 2015-12-13: qty 20

## 2015-12-13 MED ORDER — SODIUM CHLORIDE 0.9 % IV SOLN: 250.0000 mL | INTRAVENOUS | Status: DC | PRN

## 2015-12-13 MED ORDER — VANCOMYCIN HCL 10 G IV SOLR
1750.0000 mg | Freq: Once | INTRAVENOUS | Status: AC
  Administered 2015-12-13: 1750 mg via INTRAVENOUS
  Filled 2015-12-13: qty 1750

## 2015-12-13 MED ORDER — CHLORHEXIDINE GLUCONATE 0.12 % MT SOLN
15.0000 mL | Freq: Two times a day (BID) | OROMUCOSAL | Status: DC
  Administered 2015-12-13: 15 mL via OROMUCOSAL
  Filled 2015-12-13: qty 15

## 2015-12-13 MED ORDER — DEXTROSE 50 % IV SOLN
1.0000 | Freq: Once | INTRAVENOUS | Status: AC
  Administered 2015-12-13: 50 mL via INTRAVENOUS
  Filled 2015-12-13: qty 50

## 2015-12-13 MED ORDER — FENTANYL CITRATE (PF) 100 MCG/2ML IJ SOLN: 50.0000 ug | INTRAMUSCULAR | Status: DC | PRN

## 2015-12-13 MED ORDER — MIDAZOLAM HCL 10 MG/2ML IJ SOLN: 2.0000 mg | Freq: Once | INTRAMUSCULAR | Status: DC

## 2015-12-13 MED ORDER — CLOPIDOGREL BISULFATE 75 MG PO TABS: 75.0000 mg | ORAL_TABLET | Freq: Every evening | ORAL | Status: DC

## 2015-12-13 MED ORDER — IPRATROPIUM-ALBUTEROL 0.5-2.5 (3) MG/3ML IN SOLN
3.0000 mL | RESPIRATORY_TRACT | Status: DC
Start: 2015-12-13 — End: 2015-12-13

## 2015-12-13 MED ORDER — SODIUM CHLORIDE 0.9 % IV SOLN
2.0000 mg/h | INTRAVENOUS | Status: DC
  Administered 2015-12-13: 2 mg/h via INTRAVENOUS
  Filled 2015-12-13: qty 10

## 2015-12-13 MED ORDER — LEVETIRACETAM 750 MG PO TABS
750.0000 mg | ORAL_TABLET | Freq: Two times a day (BID) | ORAL | Status: DC
  Administered 2015-12-13: 750 mg via ORAL
  Filled 2015-12-13: qty 1

## 2015-12-13 MED ORDER — PIPERACILLIN-TAZOBACTAM 3.375 G IVPB 30 MIN
3.3750 g | Freq: Once | INTRAVENOUS | Status: AC
  Administered 2015-12-13: 3.375 g via INTRAVENOUS
  Filled 2015-12-13: qty 50

## 2015-12-13 MED ORDER — SODIUM CHLORIDE 0.9 % IV SOLN: INTRAVENOUS | Status: DC | PRN

## 2015-12-15 ENCOUNTER — Inpatient Hospital Stay (HOSPITAL_COMMUNITY): Admit: 2015-12-15 | Payer: Medicare HMO | Admitting: Neurosurgery

## 2015-12-15 SURGERY — FRAMELESS STEREOTACTIC BIOPSY
Anesthesia: General | Laterality: Left

## 2015-12-18 LAB — CULTURE, BLOOD (ROUTINE X 2)
Culture: NO GROWTH
Culture: NO GROWTH

## 2015-12-18 NOTE — Progress Notes (Signed)
Pt extubated per MD for comfort measures. Family at bedside. Support given, chaplain offered.

## 2015-12-18 NOTE — Progress Notes (Signed)
   27-Dec-2015 1425  Vent [facility]  Vent end date December 27, 2015  Vent end time 1425  Patient and order verified.  Patient extubated to room air.  Willeen Cass and Wink are at bedside.

## 2015-12-18 NOTE — Progress Notes (Addendum)
200 ml's of morphine and 40 ml's of ativan wasted in sink with Jodell Cipro as second RN to verify.

## 2015-12-18 NOTE — H&P (Signed)
PULMONARY / CRITICAL CARE MEDICINE   Name: Paul Mullins MRN: [id number] DOB: 11-Oct-1950    ADMISSION DATE:  27-Dec-2015  CONSULTATION DATE:  2015-12-27  REFERRING MD:  Dr Tyrone Nine ED  CHIEF COMPLAINT:  Cardiac arrest  HISTORY OF PRESENT ILLNESS:   Paul Mullins is a 65 y.o. 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  He lives at a nursing home and was reportedly normal around 11am last night. He then called out saying that he was short of breath and having chest discomfort. Shortly after the staff found him on the floor unresponsive. Firefighters arrived on the scene and did not feel a pulse so ACLS was started. When EMS arrived, the rhythm was asystole and he was given multiple rounds of epinephrine. He was brought to the [facility] ED and intubated with CXR showing bilateral pulmonary opacities concerning for edema. He was in shock and placed on epinephrine infusion. He did not have any reported neurologic improvement after ROSC.  It is clear that this was NOT witnessed. Down time about 10 min or so PEA. Was able to walk as normal day prior. Upon arrival to icu, acidosis, shock unresponsive. Myoclonic. Acidosis despite high rate vent.  PAST MEDICAL HISTORY :  He  has a past medical history of Anginal pain ([place]); Brain mass; Chronic systolic CHF (congestive heart failure) ([place]); Coronary artery disease; GERD (gastroesophageal reflux disease); Heart murmur; Hyperkalemia; Hypertension; Peripheral vascular disease ([place]); Renal disorder; Seizures ([place]); and Ventricular septal defect.  PAST SURGICAL HISTORY: He  has a past surgical history that includes Neck surgery; Neck surgery; stones; gallstones; Femoral-popliteal Bypass Graft (Right, 08/17/2012); and abdominal aortagram (N/A, 07/05/2012).  Allergies  Allergen  Reactions  . Ace Inhibitors Other (See Comments)    Impaired  Kidney Function    No current facility-administered medications on file prior to encounter.    Current Outpatient Prescriptions on File Prior to Encounter  Medication Sig  . amLODipine (NORVASC) 10 MG tablet Take 1 tablet (10 mg total) by mouth daily.  Marland Kitchen aspirin EC 81 MG tablet Take 1 tablet (81 mg total) by mouth daily.  Marland Kitchen atorvastatin (LIPITOR) 40 MG tablet Take 1 tablet (40 mg total) by mouth daily.  . cloBAZam (ONFI) 20 MG tablet Take 1 tablet (20 mg total) by mouth 2 (two) times daily.  . cloNIDine (CATAPRES) 0.2 MG tablet Take 1 tablet (0.2 mg total) by mouth 2 (two) times daily.  . clopidogrel (PLAVIX) 75 MG tablet Take 1 tablet (75 mg total) by mouth every evening.  . furosemide (LASIX) 20 MG tablet Take 1 tablet (20 mg total) by mouth daily.  Marland Kitchen gabapentin (NEURONTIN) 300 MG capsule Take 1 capsule (300 mg total) by mouth 3 (three) times daily.  . isosorbide mononitrate (IMDUR) 30 MG 24 hr tablet Take 3 tablets (90 mg total) by mouth daily.  Marland Kitchen labetalol (NORMODYNE) 200 MG tablet Take 1 tablet (200 mg total) by mouth 2 (two) times daily.  Marland Kitchen levETIRAcetam (KEPPRA) 750 MG tablet Take 1 tablet (750 mg total) by mouth 2 (two) times daily.  . pantoprazole (PROTONIX) 40 MG tablet Take 1 tablet (40 mg total) by mouth daily.  . polyethylene glycol (MIRALAX / GLYCOLAX) packet Take 17 g by mouth daily as needed. (Patient taking differently: Take 17 g by mouth daily. )  . nitroGLYCERIN (NITROSTAT)  0.4 MG SL tablet Place 1 tablet (0.4 mg total) under the tongue every 5 (five) minutes as needed for chest pain.    FAMILY HISTORY:  His indicated that his mother is deceased. He indicated that his father is deceased. He indicated that his sister is deceased. He indicated that both of his brothers are deceased.    SOCIAL HISTORY: He  reports that he quit smoking about 3 years ago. His smoking use included Cigarettes. He has never used  smokeless tobacco. He reports that he does not drink alcohol or use drugs.  REVIEW OF SYSTEMS:   Unable   VITAL SIGNS: BP (!) 101/56   Pulse 69   Temp (!) 92.7 F (33.7 C) (Oral) Comment: MD aware  Resp 19   Ht 5' 7"  (1.702 m)   Wt 98.1 kg (216 lb 4.3 oz)   SpO2 100%   BMI 33.87 kg/m    VENTILATOR SETTINGS: Vent Mode: PRVC FiO2 (%):  [50 %-100 %] 50 % Set Rate:  [15 bmp-35 bmp] 35 bmp Vt Set:  [400 mL-530 mL] 400 mL PEEP:  [5 cmH20-8 cmH20] 8 cmH20 Plateau Pressure:  [17 cmH20-30 cmH20] 17 cmH20  INTAKE / OUTPUT: I/O last 3 completed shifts: In: 62 [I.V.:50] Out: -   PHYSICAL EXAMINATION: General:  Not responsive, intubated Neuro:  1 mm pupils bilaterally fixed, flaccid extremities, twitching of face myoclonus severe, no corenals, no dolls, no coiugh, no gag HEENT:  MMM, ETT in place Cardiovascular:  s1 s2 Tachycardic, no appreciable murmurs rubs or gallops Lungs:  Coarse ronchi Abdomen:  Soft, distended, no appreciable fluid wave Musculoskeletal:  3+ pitting edema in legs Skin:  No rashes seen  LABS:  BMET  Recent Labs Lab 2016-01-02 0412 01/02/16 0430 January 02, 2016 0917  NA 141 143 143  K 6.2* 6.2* 5.6*  CL 110 110 114*  CO2 21*  --  22  BUN 26* 34* 32*  CREATININE 2.73* 2.50* 2.83*  GLUCOSE 252* 246* 77    Electrolytes  Recent Labs Lab 01/02/16 0412 Jan 02, 2016 0917  CALCIUM 8.3* 7.7*  MG  --  1.7  PHOS  --  9.0*    CBC  Recent Labs Lab Jan 02, 2016 0412 01/02/16 0430 Jan 02, 2016 0917  WBC 7.5  --  5.4  HGB 9.4* 10.9* 10.6*  HCT 33.1* 32.0* 37.8*  PLT 195  --  196    Coag's No results for input(s): APTT, INR in the last 168 hours.  Sepsis Markers  Recent Labs Lab Jan 02, 2016 0423 2016-01-02 0749 2016-01-02 0917  LATICACIDVEN 6.79* 2.82* 2.2*    ABG  Recent Labs Lab January 02, 2016 0524 01/02/16 0644 Jan 02, 2016 0927  PHART 7.161* 7.149* 7.192*  PCO2ART 57.4* 59.4* 58.2*  PO2ART 70.0* 67.0* 37.5*    Liver Enzymes  Recent Labs Lab  01-02-16 0412  AST 49*  ALT 31  ALKPHOS 88  BILITOT 0.4  ALBUMIN 2.6*    Cardiac Enzymes  Recent Labs Lab 01/02/2016 0917  TROPONINI 0.30*    Glucose No results for input(s): GLUCAP in the last 168 hours.  Imaging Dg Chest Portable 1 View  Result Date: 02-Jan-2016 CLINICAL DATA:  Endotracheal advancement, central line placement. EXAM: PORTABLE CHEST 1 VIEW COMPARISON:  Earlier this day at 0430 hour FINDINGS: Endotracheal tube is 5.7 cm from the carina. Enteric tube tip below the diaphragm. A right central line in the supraclavicular tissues is only partially visualized, tip not seen, looped directed towards the head. Right apical opacity is consistent with right upper lobe collapse. No  visualized pneumothorax or subcutaneous emphysema. Cardiomegaly with diffuse interstitial edema, right greater than left, again seen. Effusions not as well visualized. IMPRESSION: 1. Right internal jugular central venous catheter looped in the neck, tip not visualized on the current exam and directed cranially. 2. Interval opacification of the right upper lobe, which is likely right upper lobe collapse, less likely apical effusion/hemothorax given the well-defined margins. No definite pneumothorax or subcutaneous emphysema. 3. Cardiomegaly and interstitial edema, right greater than left. These results were called by telephone at the time of interpretation on Dec 24, 2015 at 6:28 am to Dr. Beverely Low , who verbally acknowledged these results. Electronically Signed   By: Jeb Levering M.D.   On: 12-24-2015 06:30   Dg Chest Portable 1 View  Result Date: 12/24/15 CLINICAL DATA:  Intubation.  Post CPR. EXAM: PORTABLE CHEST 1 VIEW COMPARISON:  11/13/2015 FINDINGS: Endotracheal tube 6.8 cm from the carina at the thoracic inlet. Enteric tube in place, tip below the diaphragm not included in the field of view. The heart is enlarged. There are bilateral pleural effusions, right greater than left. Asymmetric opacity  right increase compared to left likely secondary to layering effusion with fluid in the fissures. In diffuse increase opacities, suspicious for pulmonary edema. No pneumothorax. Fracture of the left lateral seventh rib is likely acute. IMPRESSION: 1. Endotracheal tube at the thoracic inlet.  Enteric tube in place. 2. Bilateral pleural effusions and pulmonary edema. 3. Left lateral seventh rib fracture, possibly acute. Electronically Signed   By: Jeb Levering M.D.   On: 24-Dec-2015 04:43     STUDIES:  Pending  CULTURES: Pending  ANTIBIOTICS: Zosyn and Vanc 12/24/15>>>  DISCUSSION: Paul Mullins is a 65 y.o. 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 ED. He has refractory hypoxemic respiratory failure with bilateral parenchymal lung opacities most consistent with pulmonary edema, less likely healthcare associated pneumonia. Concern for ACS and PE though without STEMI on EKG and less concern for PE considering burden of pulmonary edema throughout all lung fields. He is critically ill and in shock requiring epinephrine.    ASSESSMENT / PLAN:  PEA arrest R/o ICH associated with mass brain R/o cva Acute resp failure rul aspiration ARF, atn Shock, cradiogenoic, r/o neurogenic Near brain death malplaced IJ Uncompensated met acidosis  -Increased rate vent -epi wean to off, add levophed to map goals -chem repeat -continued Vosyn for asp -dc malplaced line  UPDATE: I have had extensive discussions with family med poa . We discussed patients current circumstances and organ failures. We also discussed patient's prior wishes under circumstances such as this. Family has decided to NOT perform resuscitation if arrest.  I explained that I thought this was an acute neuro event related to mass. Highly likely to  advance to brain death. We discussed the poor prognosis and likely poor quality of life. Family has decided to offer full comfort care. They are aware that the patient may be transferred to palliative care floor for continued comfort care needs. They have been fully updated on the process and expectations. Add ativan for myclonus and morphine  Ccm time 40 min  Lavon Paganini. Titus Mould, MD, [place] Pgr: [facility]

## 2015-12-18 NOTE — Progress Notes (Signed)
Referral to CDS called.  Referral [phone].  Ruled out at this time. Call back with TOD or if brain death criteria is to be explored.

## 2015-12-18 NOTE — Progress Notes (Signed)
CRITICAL VALUE ALERT  Critical value received:  Lactic Acid 2.2, Troponin 0.30  Date of notification:  12/18/2015  Time of notification:  [id number]  Critical value read back:Yes.    Nurse who received alert:  Zigmund Gottron, RN  Expected value MD aware.

## 2015-12-18 NOTE — ED Triage Notes (Signed)
Patient arrived from [facility] post CPR.  Staff reported he was fine at midnight and at 3a found him on the edge of the bed gasping for air asking for oxygen.  Fire moved him to the floor and he went asystole.  EMS arrived and inserted king, adm 3 epi's with ROSC in SR.  Capnography 28.

## 2015-12-18 NOTE — Progress Notes (Signed)
Patient asystole at 1437. MD notified.  2 RN's, myself and Katharine Look Merllini ausculted heart and lungs sounds for 2 minutes.  Family at bedside. Support given.

## 2015-12-18 NOTE — Procedures (Signed)
Central Venous Catheter Insertion Procedure Note Paul Mullins [id number] 1950/06/25  Procedure: Insertion of Central Venous Catheter Indications: Drug and/or fluid administration and Frequent blood sampling  Procedure Details Consent: Unable to obtain consent because of altered level of consciousness. Time Out: Verified patient identification, verified procedure, site/side was marked, verified correct patient position, special equipment/implants available, medications/allergies/relevent history reviewed, required imaging and test results available.  Performed  Maximum sterile technique was used including antiseptics, cap, gloves, gown, hand hygiene, mask and sheet. Skin prep: Chlorhexidine; local anesthetic administered A antimicrobial bonded/coated triple lumen catheter was placed in the right internal jugular vein using the Seldinger technique.  Evaluation Blood flow good Complications: No apparent complications Patient did tolerate procedure well. Chest X-ray ordered to verify placement.  CXR: pending.  Paul Mullins [facility] 2015/12/15, 6:25 AM

## 2015-12-18 NOTE — ED Provider Notes (Addendum)
[facility] DEPT Provider Note   CSN: [id number] Arrival date & time: 01/06/16  [id number]  By signing my name below, I, Paul Mullins, attest that this documentation has been prepared under the direction and in the presence of Paul Etienne, DO. Electronically Signed: Irene Mullins, ED Scribe. 01/06/2016. 4:08 AM.  History   Chief Complaint Chief Complaint  Patient presents with  . Other    Post CPR   The history is provided by the EMS personnel. No language interpreter was used.  HPI Comments (Level 5 Caveat due to acuity of condition): Paul Mullins is a 65 y.o. male with a hx of CHF, CAD, HTN, PVD, AKI, and CKD brought in by EMS who presents to the Emergency Department post-CPR. Pt presents from a nursing home. EMS were called by staff when pt was found at 3 AM sitting at the edge of his bed, gasping for air and asking for oxygen. His last seen normal was at 12 AM. Pt fell to the floor when compressions were started for asystole. Pt was given 3 epis en route, with pulses regaining on the 3rd epi administration. EMS states that pt's CBG was 215 en route. Pt's pupils are fixed and dilated.   Past Medical History:  Diagnosis Date  . Anginal pain ([place])   . Brain mass   . Chronic systolic CHF (congestive heart failure) [facility])    [facility]; Dr. Rich Reining  . Coronary artery disease    08/08/12 - patient denied CAD, but is followed for CHF  . GERD (gastroesophageal reflux disease)   . Heart murmur    "since birth"; small membraneous VSD by 01/2012 echo  . Hyperkalemia   . Hypertension   . Peripheral vascular disease ([place])   . Renal disorder   . Seizures ([place])   . 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 Hyperkalemia 11/13/2015  . Fall   . AKI (acute kidney injury) ([place])   . Bilateral leg edema   . Brain mass   . CRI (chronic renal  insufficiency)   . Leg swelling   . Normocytic anemia 11/04/2015  . Acute-on-chronic kidney injury ([place]) 11/04/2015  . Seizure disorder ([place])   . Chronic diastolic (congestive) heart failure ([place])   . Coronary artery disease involving native coronary artery of native heart without angina pectoris   . Neoplasm   . Essential hypertension 10/11/2015  . CKD (chronic kidney disease) 10/11/2015  . 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 PVD (peripheral vascular disease) ([place]) 08/07/2012    Past Surgical History:  Procedure Laterality Date  . ABDOMINAL AORTAGRAM N/A 07/05/2012   Procedure: ABDOMINAL Maxcine Ham;  Surgeon: Conrad [place], MD;  [facility] C[facility] LAB;  Service: Cardiovascular;  Laterality: N/A;  . FEMORAL-POPLITEAL BYPASS GRAFT Right 08/17/2012   Procedure: BYPASS GRAFT FEMORAL-POPLITEAL ARTERY;  Surgeon: Mal Misty, MD;  Location: [facility];  Service: Vascular;  Laterality: Right;  using non reversed Sapphenous vein with intraoperative arteriogram.  . gallstones    . NECK SURGERY    . NECK SURGERY    . stones         Home Medications    Prior to Admission medications   Medication Sig Start Date End Date Taking? Authorizing Provider  amLODipine (NORVASC) 10 MG tablet Take 1 tablet (10 mg total) by mouth daily. 11/11/15  Debbe Odea, MD  aspirin EC 81 MG tablet Take 1 tablet (81 mg total) by mouth daily. 11/11/15   Debbe Odea, MD  atorvastatin (LIPITOR) 40 MG tablet Take 1 tablet (40 mg total) by mouth daily. 11/11/15   Debbe Odea, MD  Cholecalciferol (D3-1000) 1000 units tablet Take 1,000 Units by mouth daily.    Historical Provider, MD  cloBAZam (ONFI) 20 MG tablet Take 1 tablet (20 mg total) by mouth 2 (two) times daily. 11/11/15   Debbe Odea, MD  cloNIDine (CATAPRES) 0.2 MG tablet Take 1 tablet (0.2 mg total) by mouth 2 (two) times daily. 11/11/15   Debbe Odea, MD  clopidogrel (PLAVIX) 75 MG tablet Take 1 tablet (75 mg total)  by mouth every evening. 11/11/15   Debbe Odea, MD  furosemide (LASIX) 20 MG tablet Take 1 tablet (20 mg total) by mouth daily. 11/11/15   Debbe Odea, MD  gabapentin (NEURONTIN) 300 MG capsule Take 1 capsule (300 mg total) by mouth 3 (three) times daily. 11/11/15   Debbe Odea, MD  isosorbide mononitrate (IMDUR) 30 MG 24 hr tablet Take 3 tablets (90 mg total) by mouth daily. 11/11/15   Debbe Odea, MD  labetalol (NORMODYNE) 200 MG tablet Take 1 tablet (200 mg total) by mouth 2 (two) times daily. 11/11/15   Debbe Odea, MD  lacosamide (VIMPAT) 200 MG TABS tablet Take 200 mg by mouth 2 (two) times daily.    Historical Provider, MD  levETIRAcetam (KEPPRA) 750 MG tablet Take 1 tablet (750 mg total) by mouth 2 (two) times daily. 11/11/15   Debbe Odea, MD  nitroGLYCERIN (NITROSTAT) 0.4 MG SL tablet Place 1 tablet (0.4 mg total) under the tongue every 5 (five) minutes as needed for chest pain. 11/11/15   Debbe Odea, MD  pantoprazole (PROTONIX) 40 MG tablet Take 1 tablet (40 mg total) by mouth daily. 11/11/15   Debbe Odea, MD  polyethylene glycol (MIRALAX / GLYCOLAX) packet Take 17 g by mouth daily as needed. 11/11/15   Debbe Odea, MD    Family History Family History  Problem Relation Age of Onset  . Diabetes Mother   . Hypertension Mother   . Diabetes Father   . Hypertension Father     Social History Social History  Substance Use Topics  . Smoking status: Former Smoker    Types: Cigarettes    Quit date: 06/09/2012  . Smokeless tobacco: Never Used  . Alcohol use No     Allergies   Ace inhibitors   Review of Systems Review of Systems  Unable to perform ROS: Acuity of condition     Physical Exam Updated Vital Signs BP 119/82 (BP Location: Left Arm)   Pulse 70   Resp 15   Ht 5\' 7"  (1.702 m)   Wt 190 lb (86.2 kg)   SpO2 100%   BMI 29.76 kg/m   Physical Exam  Constitutional: He appears well-nourished. He appears ill.  obese; no signs of trauma   HENT:  Head:  Normocephalic and atraumatic.  Eyes: Pupils are equal, round, and reactive to light. Right eye exhibits no discharge. Left eye exhibits no discharge.  Pupils 3 mm and sluggish  Neck: Normal range of motion. Neck supple. JVD present.  KING airway in place, bloody sputum in tube; JVD up to angle of jaw bilaterally  Cardiovascular: Tachycardia present.   Faint heart sounds  Pulmonary/Chest: No respiratory distress. He has wheezes.  Diffuse inspiratory and expiratory wheezing  Abdominal: He exhibits distension. There is no rebound  and no guarding.  Genitourinary: Penis normal.  Musculoskeletal: He exhibits edema (2+ to the knees).  Left tibial IO in place  Neurological: He is unresponsive. GCS eye subscore is 1. GCS verbal subscore is 1. GCS motor subscore is 1.  Skin: Skin is warm and dry. No rash noted. No pallor.  Psychiatric: He has a normal mood and affect. His behavior is normal.  Nursing note and vitals reviewed.    ED Treatments / Results   COORDINATION OF CARE: 4:02 AM-labs, EKG, x-ray.   Labs (all labs ordered are listed, but only abnormal results are displayed) Labs Reviewed  CBC WITH DIFFERENTIAL/PLATELET - Abnormal; Notable for the following:       Result Value   RBC 3.33 (*)    Hemoglobin 9.4 (*)    HCT 33.1 (*)    MCHC 28.4 (*)    RDW 15.6 (*)    Eosinophils Absolute 0.9 (*)    All other components within normal limits  COMPREHENSIVE METABOLIC PANEL - Abnormal; Notable for the following:    Potassium 6.2 (*)    CO2 21 (*)    Glucose, Bld 252 (*)    BUN 26 (*)    Creatinine, Ser 2.73 (*)    Calcium 8.3 (*)    Albumin 2.6 (*)    AST 49 (*)    GFR calc non Af Amer 23 (*)    GFR calc Af Amer 26 (*)    All other components within normal limits  BRAIN NATRIURETIC PEPTIDE - Abnormal; Notable for the following:    B Natriuretic Peptide 131.3 (*)    All other components within normal limits  I-STAT CG4 LACTIC ACID, ED - Abnormal; Notable for the following:     Lactic Acid, Venous 6.79 (*)    All other components within normal limits  I-STAT CHEM 8, ED - Abnormal; Notable for the following:    Potassium 6.2 (*)    BUN 34 (*)    Creatinine, Ser 2.50 (*)    Glucose, Bld 246 (*)    Calcium, Ion 1.06 (*)    Hemoglobin 10.9 (*)    HCT 32.0 (*)    All other components within normal limits  I-STAT ARTERIAL BLOOD GAS, ED - Abnormal; Notable for the following:    pH, Arterial 7.161 (*)    pCO2 arterial 57.4 (*)    pO2, Arterial 70.0 (*)    Acid-base deficit 8.0 (*)    All other components within normal limits  CULTURE, BLOOD (ROUTINE X 2)  CULTURE, BLOOD (ROUTINE X 2)  BLOOD GAS, ARTERIAL  I-STAT TROPOININ, ED    EKG  EKG Interpretation  Date/Time:  Dec 24, 2015 04:00:29 EDT Ventricular Rate:  77 PR Interval:    QRS Duration: 92 QT Interval:  434 QTC Calculation: 492 R Axis:   64 Text Interpretation:  Sinus rhythm Borderline prolonged QT interval ? peaked T waves Reconfirmed by Haywood Meinders MD, DANIEL [phone]) on 12-24-2015 5:41:29 AM       Radiology Dg Chest Portable 1 View  Result Date: 12-24-15 CLINICAL DATA:  Intubation.  Post CPR. EXAM: PORTABLE CHEST 1 VIEW COMPARISON:  11/13/2015 FINDINGS: Endotracheal tube 6.8 cm from the carina at the thoracic inlet. Enteric tube in place, tip below the diaphragm not included in the field of view. The heart is enlarged. There are bilateral pleural effusions, right greater than left. Asymmetric opacity right increase compared to left likely secondary to layering effusion with fluid in the fissures. In diffuse  increase opacities, suspicious for pulmonary edema. No pneumothorax. Fracture of the left lateral seventh rib is likely acute. IMPRESSION: 1. Endotracheal tube at the thoracic inlet.  Enteric tube in place. 2. Bilateral pleural effusions and pulmonary edema. 3. Left lateral seventh rib fracture, possibly acute. Electronically Signed   By: Jeb Levering M.D.   On: December 19, 2015 04:43     Procedures .Intubation Date/Time: 2015/12/19 4:11 AM Performed by: Tyrone Nine Lua Feng Authorized by: Paul Mullins   Consent:    Consent obtained:  Emergent situation   Consent given by:  Healthcare agent   Risks discussed:  Aspiration, brain injury, bleeding, death and dental trauma   Alternatives discussed:  No treatment Pre-procedure details:    Patient status:  Unresponsive   Mallampati score:  IV   Pretreatment medications:  None   Paralytics:  None Procedure details:    Preoxygenation:  ILMA/LMA   CPR in progress: no     Intubation method:  Oral   Oral intubation technique:  Direct and video-assisted   Laryngoscope blade:  Mac 4   Tube size (mm):  7.5   Tube type:  Cuffed   Number of attempts:  2   Ventilation between attempts: no     Cricoid pressure: no     Tube visualized through cords: yes   Placement assessment:    ETT to lip:  23   Tube secured with:  Adhesive tape and ETT holder   Breath sounds:  Equal   Placement verification: chest rise and CXR verification     CXR findings:  ETT in proper place (Advanced 4cm) Post-procedure details:    Patient tolerance of procedure:  Tolerated well, no immediate complications Comments:     Unable to visualize the cords on DL. Able to visualize the epiglottis. Attempted to pass the tube blindly and failed. Cords easily identified with video laryngoscopy. ARTERIAL LINE Date/Time: 19-Dec-2015 5:38 AM Performed by: Tyrone Nine Allayna Erlich Authorized by: Paul Mullins   Consent:    Consent obtained:  Emergent situation   Consent given by:  Healthcare agent   Risks discussed:  Bleeding, ischemia, infection and pain Indications:    Indications: hemodynamic monitoring   Pre-procedure details:    Skin preparation:  2% Chlorhexidine   Preparation: Patient was prepped and draped in sterile fashion   Anesthesia (see MAR for exact dosages):    Anesthesia method:  None Procedure details:    Location:  R radial   Allen's test performed: no      Needle gauge:  22 G   Placement technique:  Ultrasound guided   Number of attempts:  2   Transducer: waveform confirmed   Post-procedure details:    Patient tolerance of procedure:  Tolerated well, no immediate complications Comments:     A line placed. Immediately failed. Second attempt also failed.   (including critical care time)  CRITICAL CARE Performed by: Paul Etienne, DO Total critical care time: 80 minutes Critical care time was exclusive of separately billable procedures and treating other patients. Critical care was necessary to treat or prevent imminent or life-threatening deterioration. Critical care was time spent personally by me on the following activities: development of treatment plan with patient and/or surrogate as well as nursing, discussions with consultants, evaluation of patient's response to treatment, examination of patient, obtaining history from patient or surrogate, ordering and performing treatments and interventions, ordering and review of laboratory studies, ordering and review of radiographic studies, pulse oximetry and re-evaluation of patient's condition.  Medications Ordered in ED  Medications  EPINEPHrine (ADRENALIN) 4 mg in dextrose 5 % 250 mL (0.016 mg/mL) infusion (10 mcg/min Intravenous New Bag/Given 12-22-2015 0421)  vancomycin (VANCOCIN) 1,750 mg in sodium chloride 0.9 % 500 mL IVPB (not administered)  ipratropium-albuterol (DUONEB) 0.5-2.5 (3) MG/3ML nebulizer solution 3 mL (not administered)  0.9 %  sodium chloride infusion (not administered)  calcium gluconate 1 g in sodium chloride 0.9 % 100 mL IVPB (not administered)  albuterol (PROVENTIL) (2.5 MG/3ML) 0.083% nebulizer solution 10 mg (not administered)  insulin aspart (novoLOG) injection 10 Units (not administered)  dextrose 50 % solution 50 mL (not administered)  sodium chloride 0.9 % bolus 1,000 mL (1,000 mLs Intravenous New Bag/Given 12/22/15 0450)  piperacillin-tazobactam (ZOSYN) IVPB 3.375 g (0  g Intravenous Stopped 22-Dec-2015 0520)     Initial Impression / Assessment and Plan / ED Course  I have reviewed the triage vital signs and the nursing notes.  Pertinent labs & imaging results that were available during my care of the patient were reviewed by me and considered in my medical decision making (see chart for details).  Clinical Course    65 yo M With a chief complaint of post CPR. Patient initial complaint reportedly was shortness of breath. Patient was hyperkalemic here and was temporized. We'll broadly cover with antibiotics possible infection. EKG without ST elevation MI. Discussed with critical care. Admit.  The patients results and plan were reviewed and discussed.   Any x-rays performed were independently reviewed by myself.   Differential diagnosis were considered with the presenting HPI.  Medications  EPINEPHrine (ADRENALIN) 4 mg in dextrose 5 % 250 mL (0.016 mg/mL) infusion (10 mcg/min Intravenous New Bag/Given 12-22-15 0421)  vancomycin (VANCOCIN) 1,750 mg in sodium chloride 0.9 % 500 mL IVPB (not administered)  ipratropium-albuterol (DUONEB) 0.5-2.5 (3) MG/3ML nebulizer solution 3 mL (not administered)  0.9 %  sodium chloride infusion (not administered)  calcium gluconate 1 g in sodium chloride 0.9 % 100 mL IVPB (not administered)  albuterol (PROVENTIL) (2.5 MG/3ML) 0.083% nebulizer solution 10 mg (not administered)  insulin aspart (novoLOG) injection 10 Units (not administered)  dextrose 50 % solution 50 mL (not administered)  sodium chloride 0.9 % bolus 1,000 mL (1,000 mLs Intravenous New Bag/Given 22-Dec-2015 0450)  piperacillin-tazobactam (ZOSYN) IVPB 3.375 g (0 g Intravenous Stopped 2015/12/22 0520)    Vitals:   12/22/2015 0409 12-22-2015 0411 22-Dec-2015 0434  BP: 119/82    Pulse: 70  70  Resp: 14  15  SpO2: (!) 88%  100%  Weight:  190 lb (86.2 kg)   Height:  5\' 7"  (1.702 m)     Final diagnoses:  Cardiac arrest [facility])    Admission/ observation were discussed  with the admitting physician, patient and/or family and they are comfortable with the plan.     Final Clinical Impressions(s) / ED Diagnoses   Final diagnoses:  Cardiac arrest [facility])  I personally performed the services described in this documentation, which was scribed in my presence. The recorded information has been reviewed and is accurate.    New Prescriptions New Prescriptions   No medications on file     Paul Etienne, DO 12-22-2015 [place], DO 12/22/15 [phone]

## 2015-12-18 NOTE — H&P (Addendum)
PULMONARY / CRITICAL CARE MEDICINE   Name: Paul Mullins MRN: [id number] DOB: 1951-03-17    ADMISSION DATE:  01/02/16  CONSULTATION DATE:  2016-01-02  REFERRING MD:  Dr Tyrone [facility] ED  CHIEF COMPLAINT:  Cardiac arrest  HISTORY OF PRESENT ILLNESS:   Paul Mullins is a 65 y.o. 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  He lives at a nursing home and was reportedly normal around 11am last night. He then called out saying that he was short of breath and having chest discomfort. Shortly after the staff found him on the floor unresponsive. Firefighters arrived on the scene and did not feel a pulse so ACLS was started. When EMS arrived, the rhythm was asystole and he was given multiple rounds of epinephrine. He was brought to the [facility] ED and intubated with CXR showing bilateral pulmonary opacities concerning for edema. He was in shock and placed on epinephrine infusion. He did not have any reported neurologic improvement after ROSC.   PAST MEDICAL HISTORY :  He  has a past medical history of Anginal pain ([place]); Brain mass; Chronic systolic CHF (congestive heart failure) ([place]); Coronary artery disease; GERD (gastroesophageal reflux disease); Heart murmur; Hyperkalemia; Hypertension; Peripheral vascular disease ([place]); Renal disorder; Seizures ([place]); and Ventricular septal defect.  PAST SURGICAL HISTORY: He  has a past surgical history that includes Neck surgery; Neck surgery; stones; gallstones; Femoral-popliteal Bypass Graft (Right, 08/17/2012); and abdominal aortagram (N/A, 07/05/2012).  Allergies  Allergen Reactions  . Ace Inhibitors Other (See Comments)    Impaired  Kidney Function    No current facility-administered medications on file prior to encounter.    Current Outpatient Prescriptions on File Prior to  Encounter  Medication Sig  . amLODipine (NORVASC) 10 MG tablet Take 1 tablet (10 mg total) by mouth daily.  Marland Kitchen aspirin EC 81 MG tablet Take 1 tablet (81 mg total) by mouth daily.  Marland Kitchen atorvastatin (LIPITOR) 40 MG tablet Take 1 tablet (40 mg total) by mouth daily.  . Cholecalciferol (D3-1000) 1000 units tablet Take 1,000 Units by mouth daily.  . cloBAZam (ONFI) 20 MG tablet Take 1 tablet (20 mg total) by mouth 2 (two) times daily.  . cloNIDine (CATAPRES) 0.2 MG tablet Take 1 tablet (0.2 mg total) by mouth 2 (two) times daily.  . clopidogrel (PLAVIX) 75 MG tablet Take 1 tablet (75 mg total) by mouth every evening.  . furosemide (LASIX) 20 MG tablet Take 1 tablet (20 mg total) by mouth daily.  Marland Kitchen gabapentin (NEURONTIN) 300 MG capsule Take 1 capsule (300 mg total) by mouth 3 (three) times daily.  . isosorbide mononitrate (IMDUR) 30 MG 24 hr tablet Take 3 tablets (90 mg total) by mouth daily.  Marland Kitchen labetalol (NORMODYNE) 200 MG tablet Take 1 tablet (200 mg total) by mouth 2 (two) times daily.  Marland Kitchen lacosamide (VIMPAT) 200 MG TABS tablet Take 200 mg by mouth 2 (two) times daily.  Marland Kitchen levETIRAcetam (KEPPRA) 750 MG tablet Take 1 tablet (750 mg total) by mouth 2 (two) times daily.  . nitroGLYCERIN (NITROSTAT) 0.4 MG SL tablet Place 1 tablet (0.4 mg total) under the tongue every 5 (five) minutes as needed for chest pain.  . pantoprazole (PROTONIX) 40 MG tablet Take 1 tablet (40 mg total) by mouth daily.  . polyethylene glycol (MIRALAX / GLYCOLAX) packet Take 17 g  by mouth daily as needed.    FAMILY HISTORY:  His indicated that his mother is deceased. He indicated that his father is deceased. He indicated that his sister is deceased. He indicated that both of his brothers are deceased.    SOCIAL HISTORY: He  reports that he quit smoking about 3 years ago. His smoking use included Cigarettes. He has never used smokeless tobacco. He reports that he does not drink alcohol or use drugs.  REVIEW OF SYSTEMS:   Unable  to be obtained due to depressed mental status   VITAL SIGNS: BP 119/82 (BP Location: Left Arm)   Pulse 70   Resp 15   Ht 5\' 7"  (1.702 m)   Wt 86.2 kg (190 lb)   SpO2 100%   BMI 29.76 kg/m    VENTILATOR SETTINGS: Vent Mode: PRVC FiO2 (%):  [95 %] 95 % Set Rate:  [15 bmp-24 bmp] 24 bmp Vt Set:  [460 mL-530 mL] 460 mL PEEP:  [5 cmH20-8 cmH20] 8 cmH20 Plateau Pressure:  [30 cmH20] 30 cmH20  INTAKE / OUTPUT: No intake/output data recorded.  PHYSICAL EXAMINATION: General:  Not responsive, intubated Neuro:  37mm pupils bilaterally without much response to light, flaccid extremities, twitching of face and shoulders noted intermittently, myoclonic jerkjs few times a minute in all extremities HEENT:  MMM, ETT in place Cardiovascular:  Tachycardic, no appreciable murmurs rubs or gallops Lungs:  Coarse breath sounds in all lung fields Abdomen:  Soft, distended, no appreciable fluid wave Musculoskeletal:  2+ pitting edema in legs Skin:  No rashes seen  LABS:  BMET  Recent Labs Lab 01-07-16 0412 2016/01/07 0430  NA 141 143  K 6.2* 6.2*  CL 110 110  CO2 21*  --   BUN 26* 34*  CREATININE 2.73* 2.50*  GLUCOSE 252* 246*    Electrolytes  Recent Labs Lab 01-07-2016 0412  CALCIUM 8.3*    CBC  Recent Labs Lab Jan 07, 2016 0412 2016/01/07 0430  WBC 7.5  --   HGB 9.4* 10.9*  HCT 33.1* 32.0*  PLT 195  --     Coag's No results for input(s): APTT, INR in the last 168 hours.  Sepsis Markers  Recent Labs Lab Jan 07, 2016 0423  LATICACIDVEN 6.79*    ABG  Recent Labs Lab 01/07/16 0524  PHART 7.161*  PCO2ART 57.4*  PO2ART 70.0*    Liver Enzymes  Recent Labs Lab Jan 07, 2016 0412  AST 49*  ALT 31  ALKPHOS 88  BILITOT 0.4  ALBUMIN 2.6*    Cardiac Enzymes No results for input(s): TROPONINI, PROBNP in the last 168 hours.  Glucose No results for input(s): GLUCAP in the last 168 hours.  Imaging Dg Chest Portable 1 View  Result Date: 01-07-16 CLINICAL DATA:   Intubation.  Post CPR. EXAM: PORTABLE CHEST 1 VIEW COMPARISON:  11/13/2015 FINDINGS: Endotracheal tube 6.8 cm from the carina at the thoracic inlet. Enteric tube in place, tip below the diaphragm not included in the field of view. The heart is enlarged. There are bilateral pleural effusions, right greater than left. Asymmetric opacity right increase compared to left likely secondary to layering effusion with fluid in the fissures. In diffuse increase opacities, suspicious for pulmonary edema. No pneumothorax. Fracture of the left lateral seventh rib is likely acute. IMPRESSION: 1. Endotracheal tube at the thoracic inlet.  Enteric tube in place. 2. Bilateral pleural effusions and pulmonary edema. 3. Left lateral seventh rib fracture, possibly acute. Electronically Signed   By: Jeb Levering M.D.   On:  December 23, 2015 04:43     STUDIES:  Pending  CULTURES: Pending  ANTIBIOTICS: Zosyn and Vanc 2015-12-23  DISCUSSION: Paul Mullins is a 65 y.o. 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 He has refractory hypoxemic respiratory failure with bilateral parenchymal lung opacities most consistent with pulmonary edema, less likely healthcare associated pneumonia. Concern for ACS and PE though without STEMI on EKG and less concern for PE considering burden of pulmonary edema throughout all lung fields. He is critically ill and in shock requiring epinephrine.    ASSESSMENT / PLAN:  PULMONARY A: Acute hypoxic respiratory failure, req mechanical ventilation - Pulmonary edema in setting of heart failure and kidney disease, consideration for healthcare associated pneumonia, pulmonary embolism less likely. Will treat as ARDS P:   ARDS protocol with mechanical ventilator VAP prevention bundle Will work towards diuresis and obtain  respiratory cultures  CARDIOVASCULAR A:  S/p asystolic cardiac arrest - likely from hypoxemia. EKG does not show STEMI. S/p CPR so expect a troponin rise Suspect acute on chronic congestive heart failure exacerbation with flash pulmonary edema as cause of presentation Shock P:  Epinephrine transition to norepinephrine to reduce beta 1 stimulation in pt with suspected CAD Furosemide trial for pulmonary edema  RENAL A:   Chronic kidney disease - Creatinine 2.5, which is similar to last admission but higher than his usual baseline of 1.9 Hyperkalemia P:   Furosemide, calcium, albuterol for hyperkalemia Will place foley catheter for I/O Will possibly need CRRT for definitive treatment  GASTROINTESTINAL A:   GERD P:   Protonix NG tube  HEMATOLOGIC A:   Chronic anemia P:  Monitor  INFECTIOUS A:   Possible HAP P:   Blood urine and respiratory cultures Zosyn and Vanc 12-23-2015  ENDOCRINE A:   No issues P:   POC glucose checks  NEUROLOGIC A:   Left parietal brain mass, seizure disorder on antiepileptics Suspected acute hypoxic encephalopathy with myoclonic jerks and lack of significant neurologic response after ACLS P:   RASS goal: 0 Fentanyl and versed PRN EEG ordered for concern of seizure Head CT when stable    FAMILY  - Updates: Nephew and HCPOA Raymond updated. Patient made DNR per previous wishes.   - Inter-disciplinary family meet or Palliative Care meeting due by:  12/20/15   I spent 90 minutes of critical care time with this patient.  Beverely Low MD Pulmonary and [place] Pager: [phone]  Dec 23, 2015, 6:26 AM

## 2015-12-18 NOTE — Progress Notes (Signed)
UR Mardella Layman RN,MHA,BSN [phone]

## 2015-12-18 DEATH — deceased

## 2015-12-22 ENCOUNTER — Telehealth: Payer: Self-pay

## 2015-12-22 NOTE — Telephone Encounter (Signed)
On 12/22/2015 I received a death certificate from [place] (original). The death certificate is for burial. The patient is a patient of Doctor Titus Mould. The death certificate will be taken to [facility] ([id number]) tomorrow am (12/23/2015) for signature.  On 12/31/15 I received the death certificate back from Doctor Titus Mould. I got the death certificate ready and the death certificate was picked up.

## 2016-01-17 NOTE — Discharge Summary (Addendum)
Paul Mullins, Paul Mullins              ACCOUNT NO.:  [id number]  MEDICAL RECORD NO.:  [id number]  LOCATION:  2H14C                        FACILITY:  [facility]  PHYSICIAN:  Raylene Miyamoto, MD DATE OF BIRTH:  [birth year]  DATE OF ADMISSION:  January 05, 2016 DATE OF DISCHARGE:  05-Jan-2016                              DISCHARGE SUMMARY   A 65 year old male with congestive heart failure, systolic and diastolic with CKD stage 3, not on dialysis as well known left lateral lobe mass. Final pathology revealed seizure disorder.  Presented with cardiac arrest at the nursing home.  The patient was living at the nursing home. Reportedly normal __________ last night.  He __________.  He was short of breath having some chest discomfort.  Shortly after the staff found him on the floor unresponsive __________.  ACLS was started.  Note, the patient was planned to have a brain biopsy in few days of this mass. Post arrest, the patient showed bilateral pulmonary infiltrates concerning for edema.  He was in shock.  Placed on epinephrine.  He did not have any reasonable good neurologic examination "that he was witnessed."  After review of the chart at least 20 minutes __________ PEA.  Upon arrival actually the patient with acidosis shock, unresponsive with myoclonic twitching despite high __________ ventilation.  With the ventilation, he was doing poorly with multiorgan failure.  Discussion was held with the patient's medical power of attorney.  It was crystal clear that is __________ length was poor. There was mass in his brain which was unknown to pathology.  It was progressively causing worsening functional status.  Family decided on comfort care and the patient expired.  FINAL DIAGNOSES UPON DEATH: 1. Status post cardiac arrest with severe anoxic brain injury. 2. Brain mass of unclear pathology, rule out cancer. 3. Refractory shock post cardiac arrest. 4. History of chronic renal failure with known stage III  CKD. 5. Respiratory failure. 6. Aspiration pneumonia is RULED IN!!!! 7. Acute on chronic systolic and diastolic CHF     Raylene Miyamoto, MD     DJF/MEDQ  D:  12/22/2015  T:  12/23/2015  Job:  [id number]

## 2017-08-21 IMAGING — CT CT HEAD W/O CM
2 of 3 series · 14 of 47 positions shown, 17 images · non-contrast
Comparison: Brain MRI October 19, 2015; head CT March 04, 2016

CLINICAL DATA: Several recent falls

EXAM:
CT HEAD WITHOUT CONTRAST
TECHNIQUE: Contiguous axial images were obtained from the base of the skull
through the vertex without intravenous contrast.

[Series 2: head 5.0 h30s · axial · 0.44mm/px · z∈[-86,+49]mm · 11 of 33 slices shown, 14 images]
[im 3/33  brain]
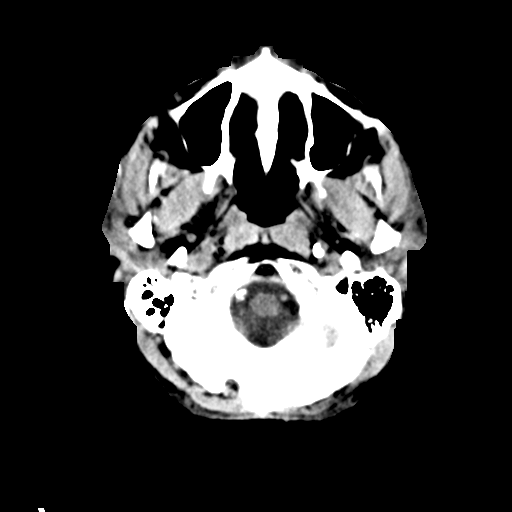
[im 3/33  bone]
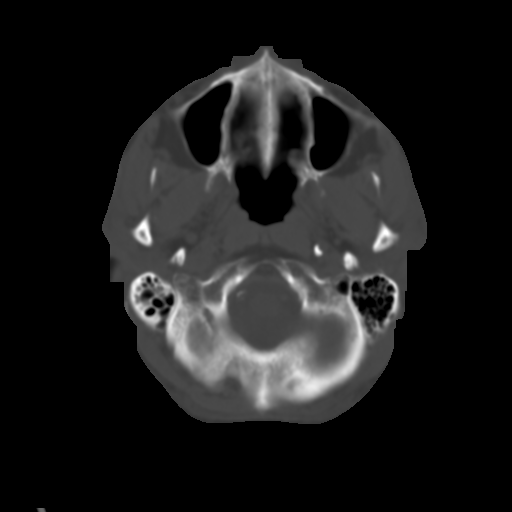
[im 5/33  brain]
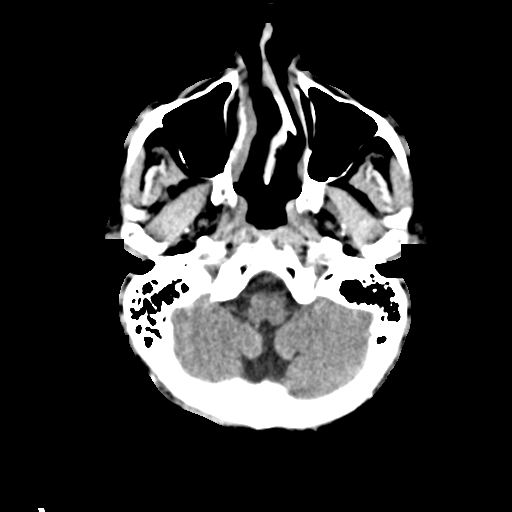
[im 8/33  brain]
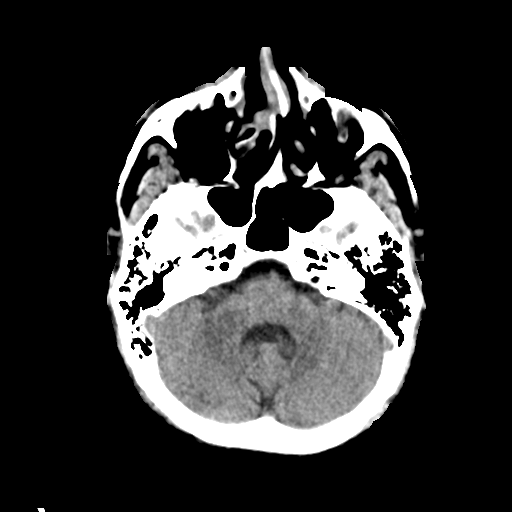
[im 10/33  brain]
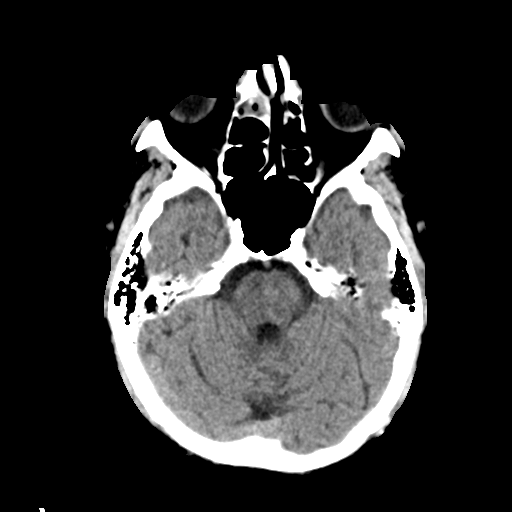
[im 14/33  brain]
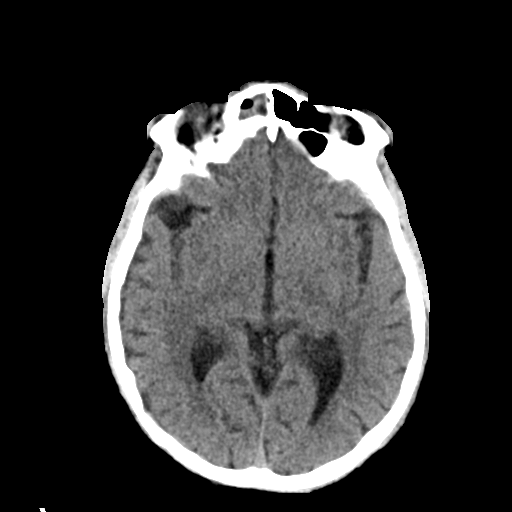
[im 14/33  bone]
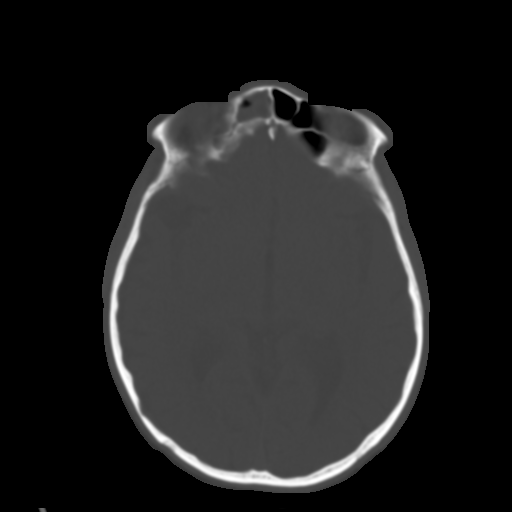
[im 17/33  brain]
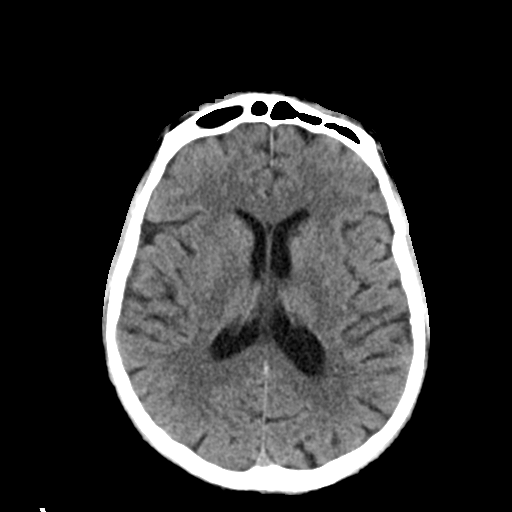
[im 19/33  brain]
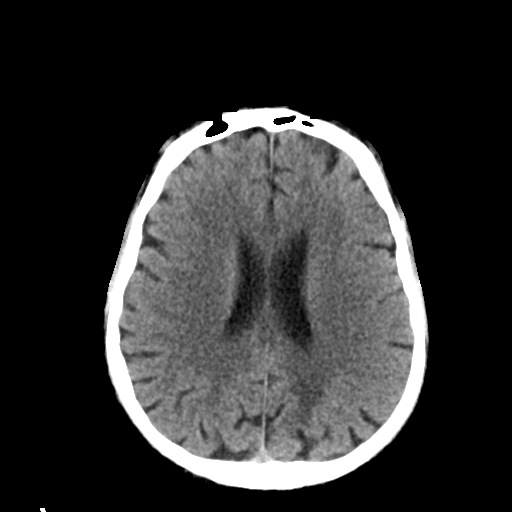
[im 23/33  brain]
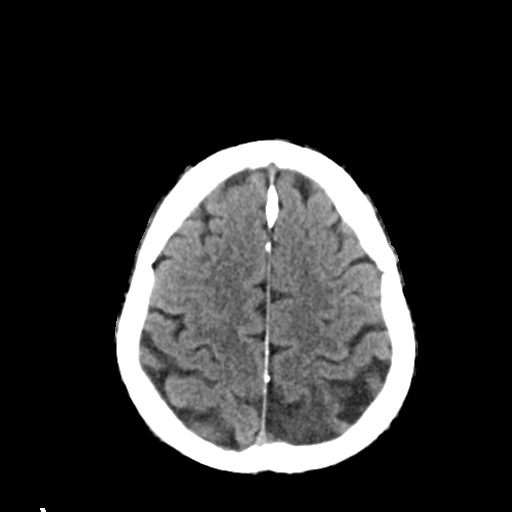
[im 25/33  brain]
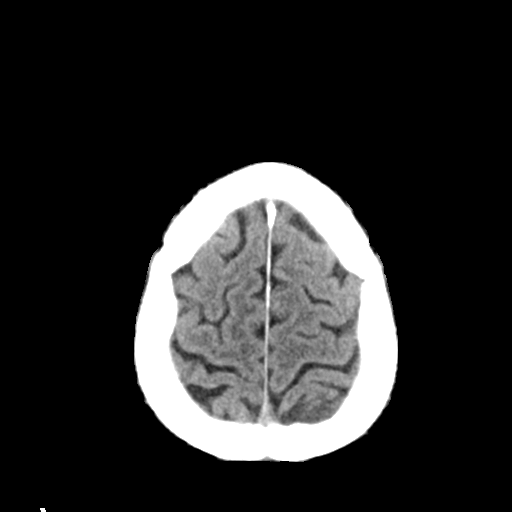
[im 25/33  bone]
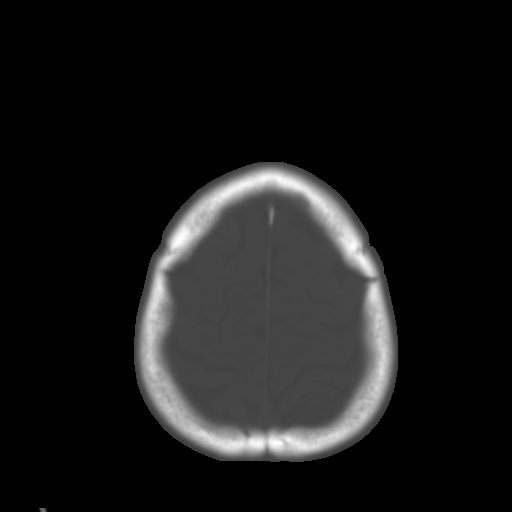
[im 28/33  brain]
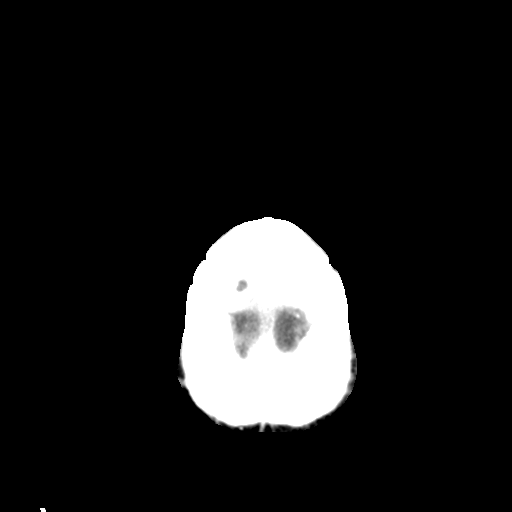
[im 30/33  brain]
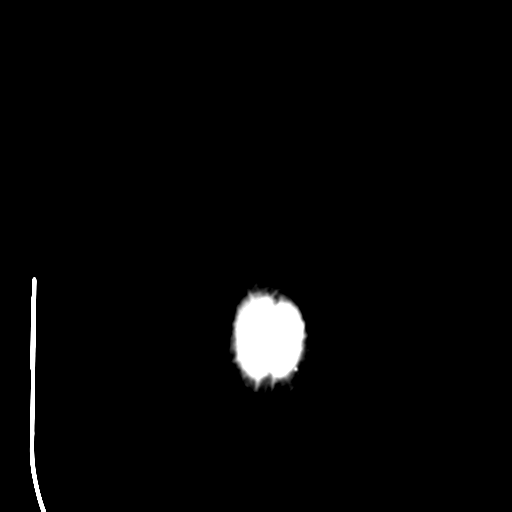

[Series 4: head 3.0 mpr cor · coronal · 0.31mm/px · 3 of 66 slices shown]
[im 22/66  brain]
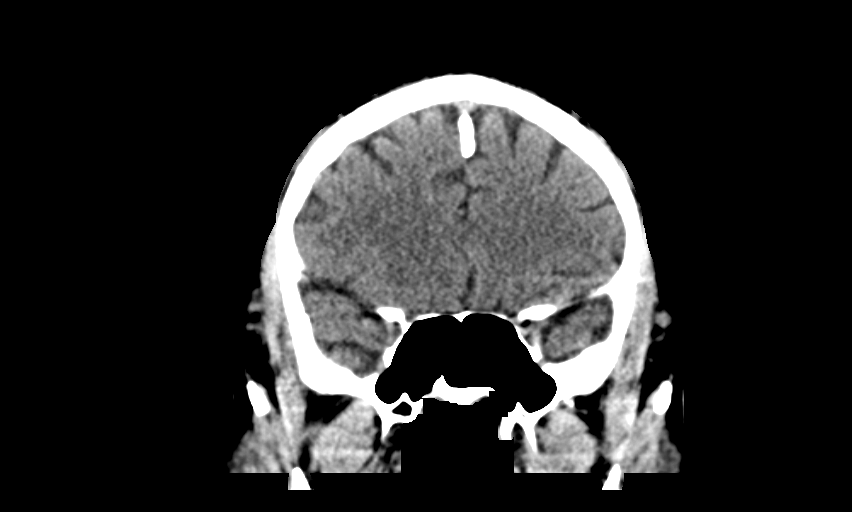
[im 29/66  brain]
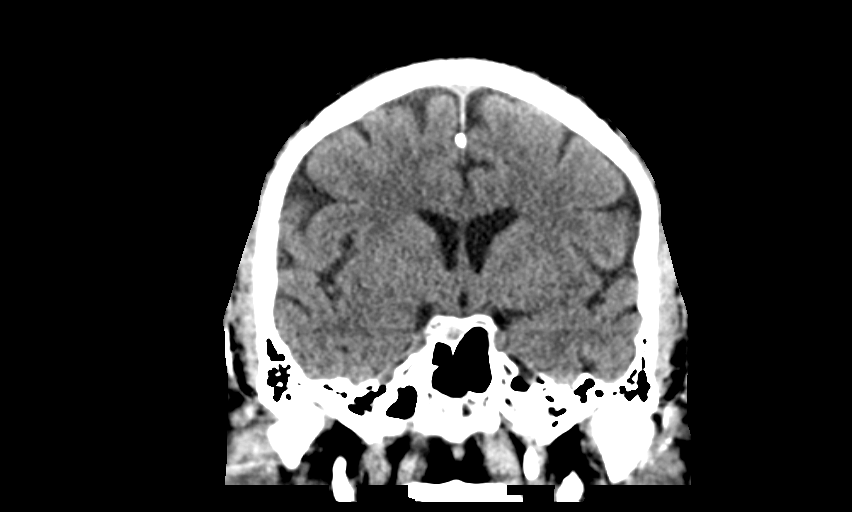
[im 37/66  brain]
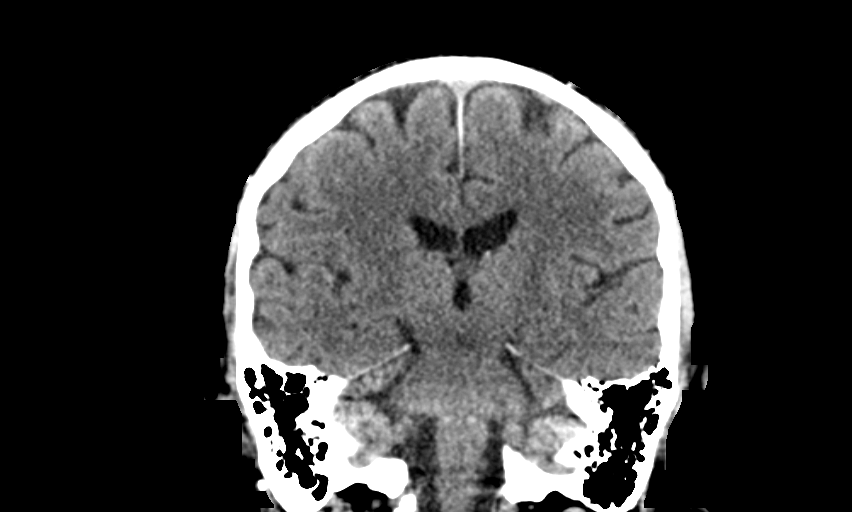

[14 of 47 positions shown; findings below may reference images not displayed]

FINDINGS: Brain: The ventricles are normal in size and configuration. The
previously noted vasogenic edema in the posterior left parietal lobe
remains without progression. There is no new mass effect or edema.
There is no hemorrhage. There is no subdural or epidural fluid
collection. No midline shift. No acute infarct evident.

Vascular: There is calcification in the distal right vertebral
artery. There is calcification in both carotid siphon and cavernous
carotid artery regions. No hyperdense vessels are evident.

Skull: Bony calvarium appears intact.

Sinuses/Orbits: Orbits appear symmetric bilaterally. There are small
retention cysts in each inferior maxillary antrum. There is
opacification of multiple ethmoid air cells bilaterally, most severe
anteriorly on both sides. There is extensive opacification in the
right frontal sinus. These changes are stable. There is leftward
deviation of the nasal septum.

Other: There is opacification of several inferior mastoid air cells,
stable.
IMPRESSION: Stable vasogenic edema in the posterior left parietal region. This
finding is concerning for underlying neoplasm. No new edema seen. No
new mass effect. No hemorrhage or acute infarct evident. No
extra-axial fluid collection.

Foci of vascular arterial calcification noted. Areas of paranasal
sinus disease stable as is inferior right mastoid air cell disease.

## 2017-08-21 IMAGING — DX DG CHEST 1V
1 series · 1 of 1 positions shown · non-contrast
Comparison: November 04, 2015

CLINICAL DATA: Pain following fall

EXAM:
CHEST 1 VIEW

[x chest ap]
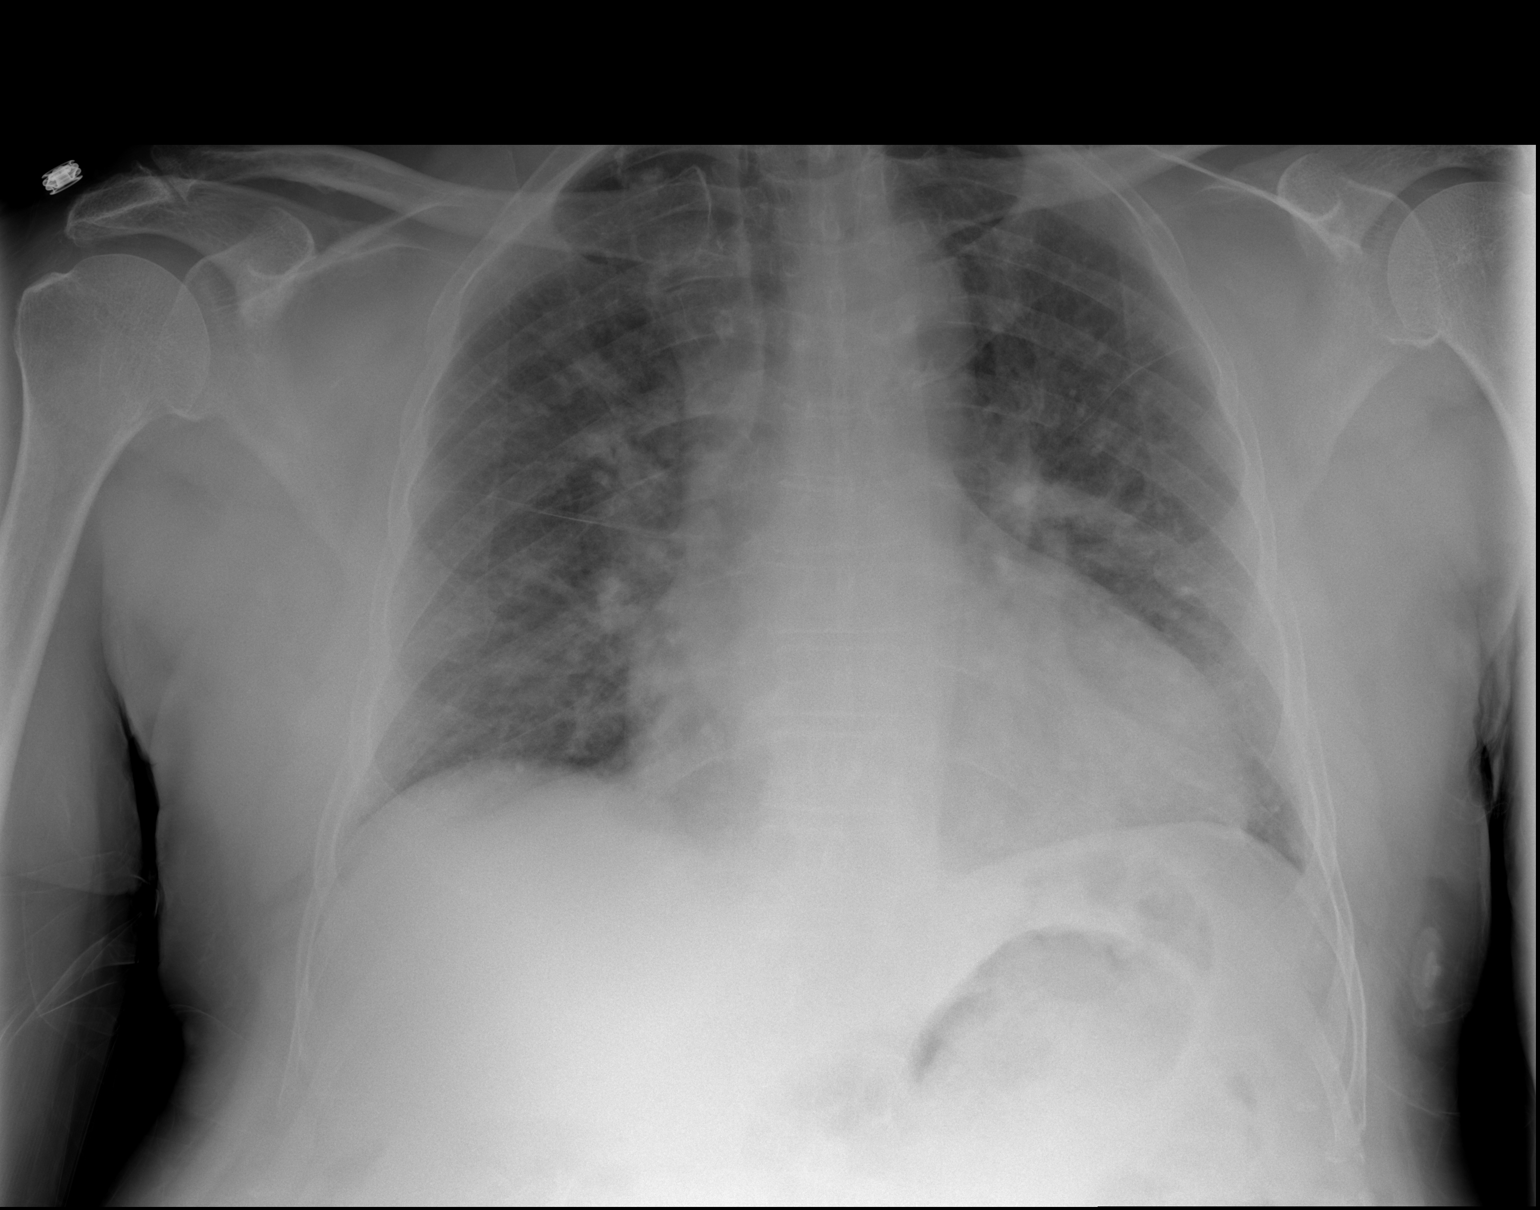

[1 of 1 positions shown; findings below may reference images not displayed]

FINDINGS: There is no edema or consolidation. The heart is mildly enlarged
with pulmonary venous hypertension. No adenopathy. No pneumothorax.
No bone lesions.
IMPRESSION: Pulmonary vascular congestion. No frank edema or consolidation. No
pneumothorax.
